# Patient Record
Sex: Male | Born: 1955 | Race: White | Hispanic: No | Marital: Single | Smoking: Never smoker
Health system: Southern US, Community
[De-identification: ages and names within clinical notes are randomized; demographics above are authoritative.]

## PROBLEM LIST (undated history)

## (undated) DIAGNOSIS — E119 Type 2 diabetes mellitus without complications: Secondary | ICD-10-CM

---

## 2015-05-25 ENCOUNTER — Ambulatory Visit (INDEPENDENT_AMBULATORY_CARE_PROVIDER_SITE_OTHER): Payer: Medicaid Other | Admitting: Sports Medicine

## 2015-05-25 ENCOUNTER — Ambulatory Visit (INDEPENDENT_AMBULATORY_CARE_PROVIDER_SITE_OTHER): Payer: Medicaid Other

## 2015-05-25 VITALS — BP 144/89 | HR 82 | Resp 18 | Ht 71.0 in | Wt 190.0 lb

## 2015-05-25 DIAGNOSIS — L84 Corns and callosities: Secondary | ICD-10-CM

## 2015-05-25 DIAGNOSIS — M79672 Pain in left foot: Secondary | ICD-10-CM

## 2015-05-25 DIAGNOSIS — M778 Other enthesopathies, not elsewhere classified: Secondary | ICD-10-CM

## 2015-05-25 DIAGNOSIS — M19072 Primary osteoarthritis, left ankle and foot: Secondary | ICD-10-CM | POA: Diagnosis not present

## 2015-05-25 DIAGNOSIS — G6289 Other specified polyneuropathies: Secondary | ICD-10-CM

## 2015-05-25 DIAGNOSIS — M7752 Other enthesopathy of left foot: Secondary | ICD-10-CM

## 2015-05-25 DIAGNOSIS — M2022 Hallux rigidus, left foot: Secondary | ICD-10-CM

## 2015-05-25 DIAGNOSIS — M779 Enthesopathy, unspecified: Secondary | ICD-10-CM

## 2015-05-25 NOTE — Progress Notes (Signed)
Patient ID: Jeremiah Burns, male   DOB: [birth year], 59 y.o.   MRN: [id number]  Subjective: [name] is a 59 y.o. male patient who presents to office for evaluation of Left foot pain. Patient complains of progressive pain especially over the last year few years in both feet which see neurologist for numbness, tingling, and burning. Patient states that left 2nd toe started bothering him >1 week ago noticed that the toe was red and was developing some skin that he pulled off on it that was very concerning.  Patient states that the left 2nd toe does not bother him as much as the neuropathy pain which is now interferring with daily activities; worse pain at night and legs feel restless. Patient has not tried any treatments for left 2nd toe. Patient denies related constitutional symptoms. Patient denies any other pedal complaints.   There are no active problems to display for this patient.  No current outpatient prescriptions on file prior to visit.   No current facility-administered medications on file prior to visit.   No Known Allergies   Objective:  General: Alert and oriented x3 in no acute distress  Dermatology: To the left 2nd toe medial aspect there is a small hyperkeratotic lesion with surrounding erythema and fullness, there is no warmth, no fluctuance or acute signs of infection , no webspace macerations, no ecchymosis bilateral, all nails x 10 are well manicured.  Vascular: Dorsalis Pedis and Posterior Tibial pedal pulses 2/4, Capillary Fill Time 3 seconds, + scant pedal hair growth bilateral, no edema bilateral lower extremities, Temperature gradient within normal limits.  Neurology: Michaell Cowing sensation intact via light touch bilateral, Protective sensation diminished with Semmes Weinstein Monofilament to all pedal sites, vibratory absent bilateral, Deep tendon reflexes within normal limits bilateral, No babinski sign present bilateral.   Musculoskeletal: Mild tenderness with  palpation at Left 2nd toe medial aspect with bony irregularity noted at the PIPJ suggestive of arthritic changes with limitation in motion, limitation of motion left 1st MTPJ-asymptomatic in nature, Muscular strength 5/5 in all groups tested. No pain with calf compression bilateral.   Left Foot 3 views    Impression: Significant spurring and arthritic changes at left 1st MTPJ, 2nd PIPJ, midfoot and ankle. No fracture/dislocation.   Assessment and Plan: Problem List Items Addressed This Visit    None    Visit Diagnoses    Left foot pain    -  Primary    Relevant Orders    DG Foot Complete Left    Capsulitis of foot, left        2nd pipj     Callus of foot        left 2nd toe medial aspect secondary to rubbing against big toe    Osteoarthritis of left foot, unspecified osteoarthritis type        left 2nd pipj    Hallux rigidus of left foot        Asymptomatic     Other polyneuropathy (HCC)        Relevant Medications    gabapentin (NEURONTIN) 300 MG capsule       -Complete examination performed -Xrays reviewed -Discussed treatement options, risks, alternatives, benefits -Answered all patients questions -Debrided callus using sterile chisel blade at medial aspect of left 2nd toe, injected inflamed capsule at this site with 0.25cc kenalog 10, 0.25cc dexamethasone phosphate mixed with 0.25cc lidocaine and marcaine plain; padded the area with silicone pad; instructed patient on how to use padding. -Recommend good supportive shoes -Recommend  patient follow up with neurologist for neuropathic pain; encouraged patient to consider neurosurgery as well  -Patient to return to office as needed or sooner if condition worsens.  [name], DPM

## 2016-10-03 DIAGNOSIS — Z789 Other specified health status: Secondary | ICD-10-CM

## 2016-10-03 DIAGNOSIS — E119 Type 2 diabetes mellitus without complications: Secondary | ICD-10-CM

## 2016-10-03 DIAGNOSIS — L0390 Cellulitis, unspecified: Secondary | ICD-10-CM

## 2016-10-03 DIAGNOSIS — E876 Hypokalemia: Secondary | ICD-10-CM

## 2016-10-03 DIAGNOSIS — K3184 Gastroparesis: Secondary | ICD-10-CM

## 2016-10-04 DIAGNOSIS — E876 Hypokalemia: Secondary | ICD-10-CM | POA: Diagnosis not present

## 2016-10-04 DIAGNOSIS — K3184 Gastroparesis: Secondary | ICD-10-CM | POA: Diagnosis not present

## 2016-10-04 DIAGNOSIS — E119 Type 2 diabetes mellitus without complications: Secondary | ICD-10-CM | POA: Diagnosis not present

## 2016-10-04 DIAGNOSIS — L0390 Cellulitis, unspecified: Secondary | ICD-10-CM | POA: Diagnosis not present

## 2016-10-05 DIAGNOSIS — E119 Type 2 diabetes mellitus without complications: Secondary | ICD-10-CM | POA: Diagnosis not present

## 2016-10-05 DIAGNOSIS — K3184 Gastroparesis: Secondary | ICD-10-CM | POA: Diagnosis not present

## 2016-10-05 DIAGNOSIS — L0390 Cellulitis, unspecified: Secondary | ICD-10-CM | POA: Diagnosis not present

## 2016-10-05 DIAGNOSIS — E876 Hypokalemia: Secondary | ICD-10-CM | POA: Diagnosis not present

## 2016-10-06 DIAGNOSIS — E119 Type 2 diabetes mellitus without complications: Secondary | ICD-10-CM | POA: Diagnosis not present

## 2016-10-06 DIAGNOSIS — E876 Hypokalemia: Secondary | ICD-10-CM | POA: Diagnosis not present

## 2016-10-06 DIAGNOSIS — L0390 Cellulitis, unspecified: Secondary | ICD-10-CM | POA: Diagnosis not present

## 2016-10-06 DIAGNOSIS — K3184 Gastroparesis: Secondary | ICD-10-CM | POA: Diagnosis not present

## 2017-08-11 ENCOUNTER — Other Ambulatory Visit: Payer: Self-pay | Admitting: Nurse Practitioner

## 2017-08-11 DIAGNOSIS — B182 Chronic viral hepatitis C: Secondary | ICD-10-CM

## 2017-08-27 ENCOUNTER — Ambulatory Visit
Admission: RE | Admit: 2017-08-27 | Discharge: 2017-08-27 | Disposition: A | Discharge status: Home / Self Care | Payer: Medicaid Other | Source: Ambulatory Visit | Attending: Nurse Practitioner | Admitting: Nurse Practitioner

## 2017-08-27 DIAGNOSIS — B182 Chronic viral hepatitis C: Secondary | ICD-10-CM

## 2019-02-17 IMAGING — US US ABDOMEN COMPLETE W/ ELASTOGRAPHY
1 series · 13 of 15 positions shown · non-contrast
Comparison: None.

CLINICAL DATA: Hepatitis-C



[Series 1: us abdomen complete w/ elastography · 0.14mm/px · 13 of 15 slices shown]
[im 1/15]
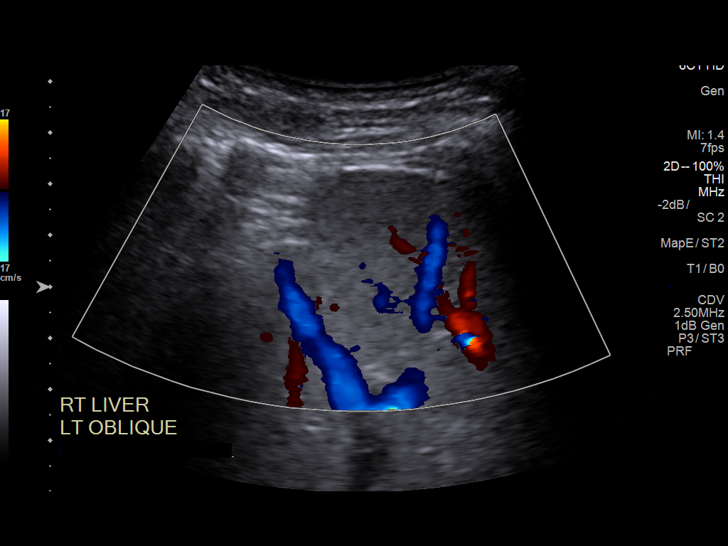
[im 2/15]
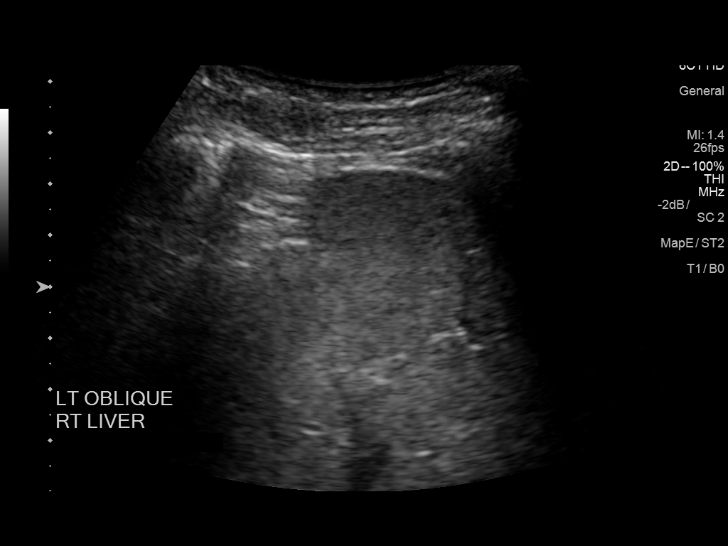
[im 3/15]
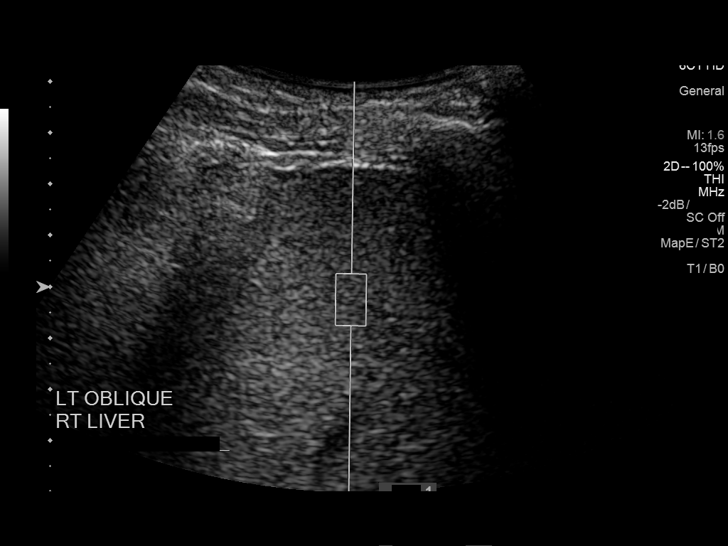
[im 5/15]
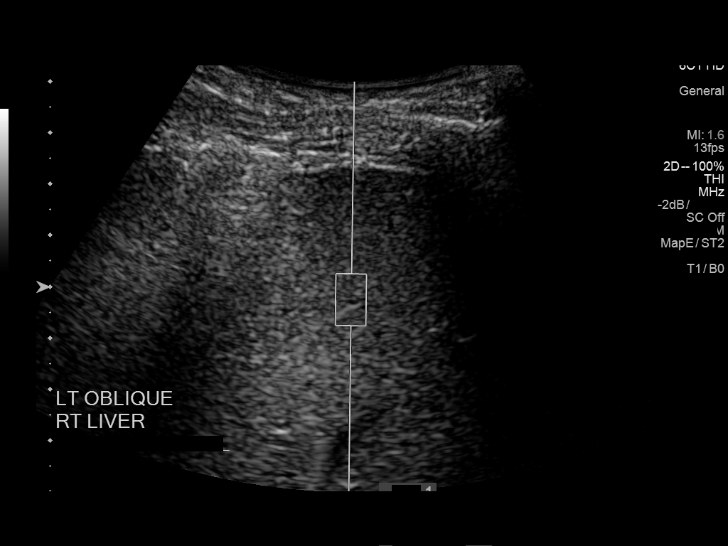
[im 6/15]
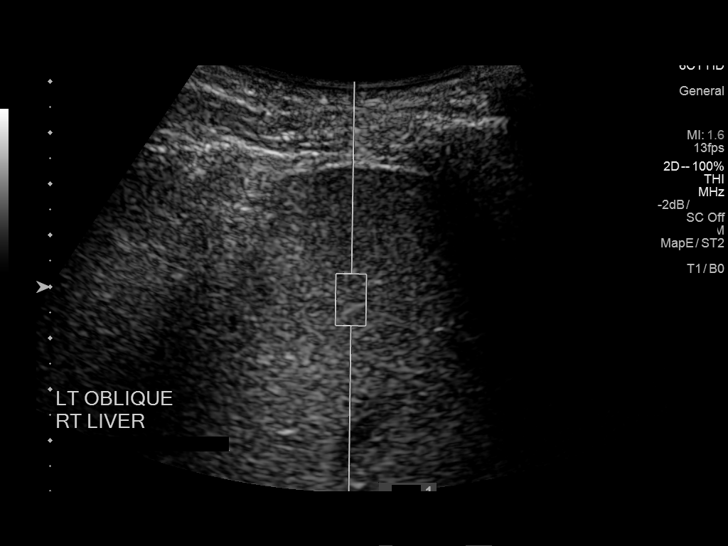
[im 7/15]
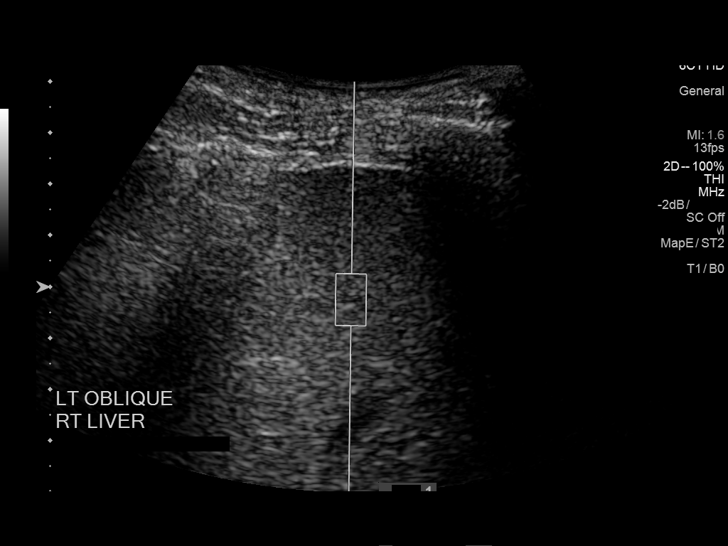
[im 8/15]
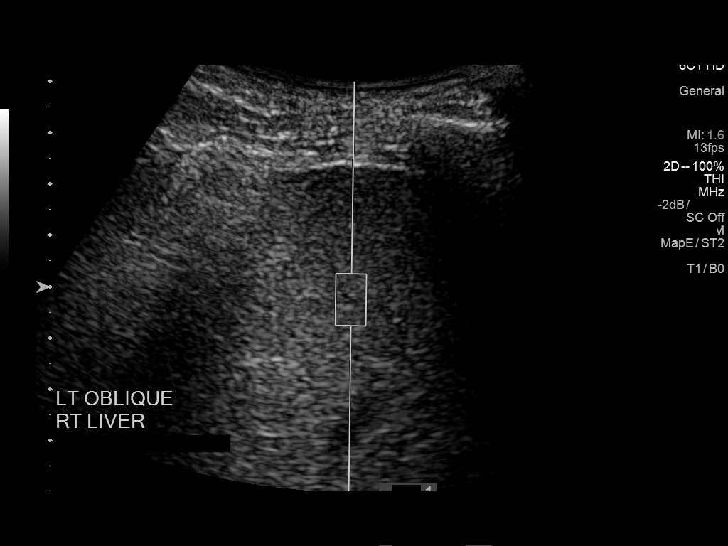
[im 9/15]
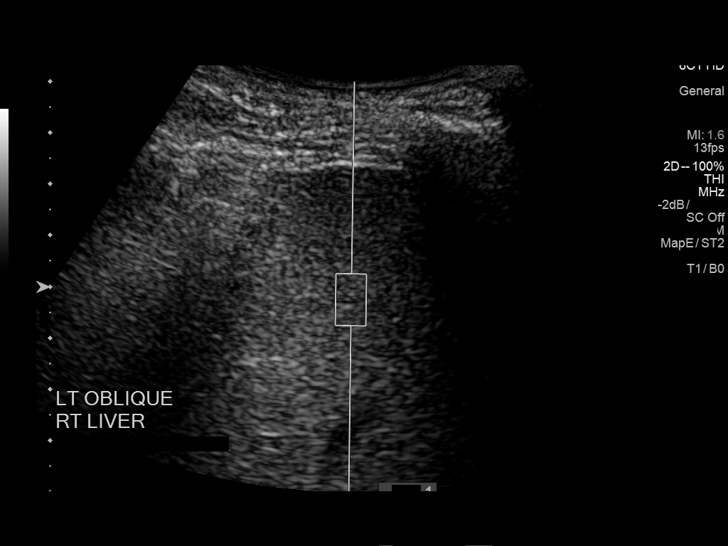
[im 10/15]
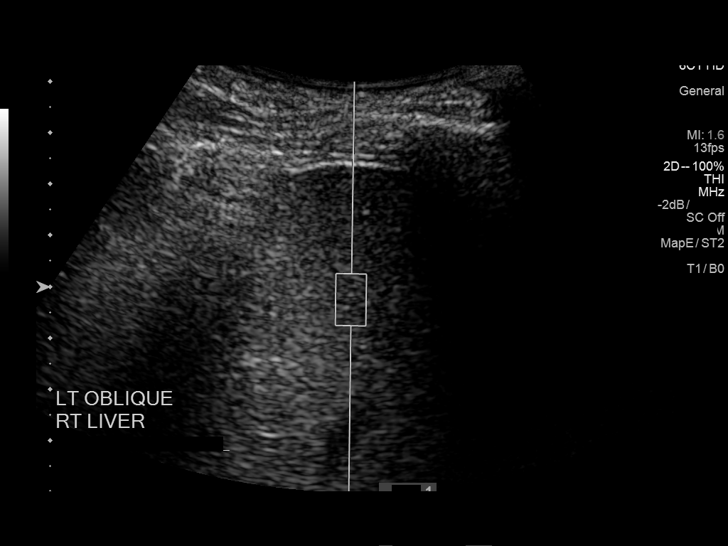
[im 11/15]
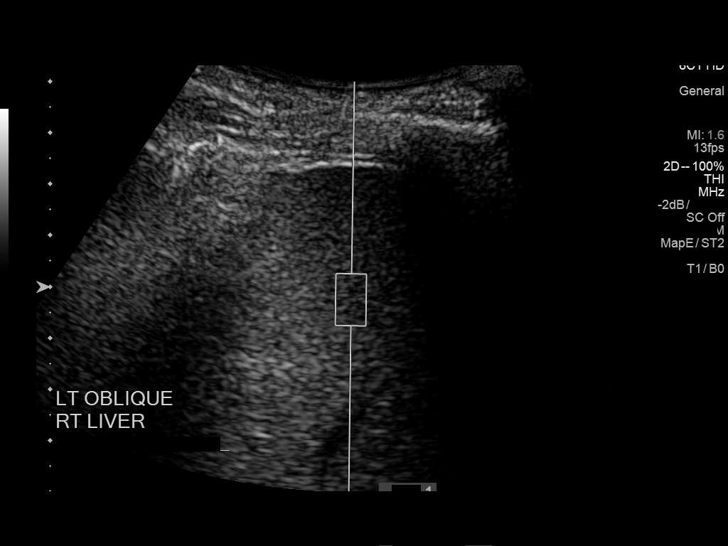
[im 13/15]
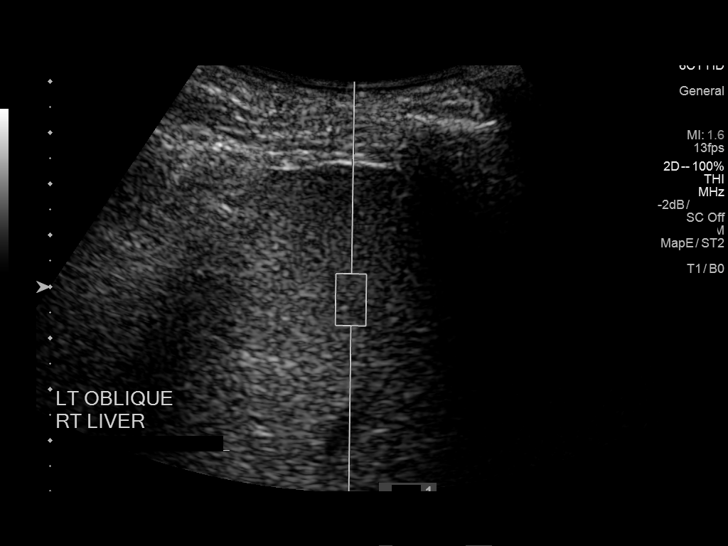
[im 14/15]
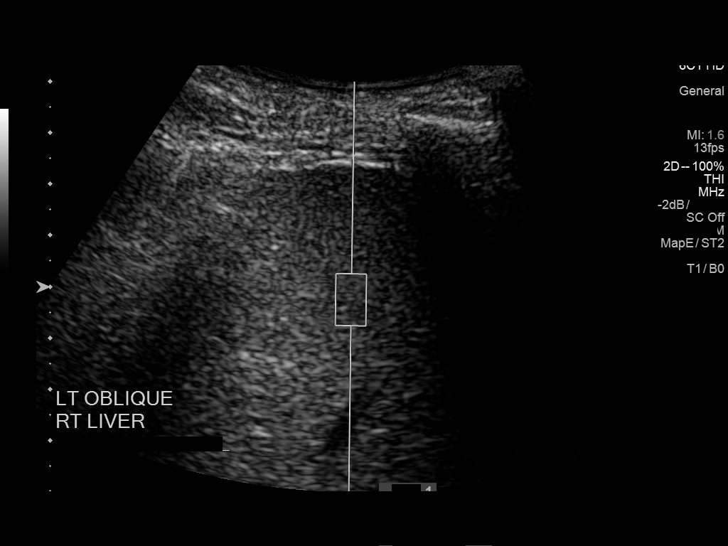
[im 15/15]
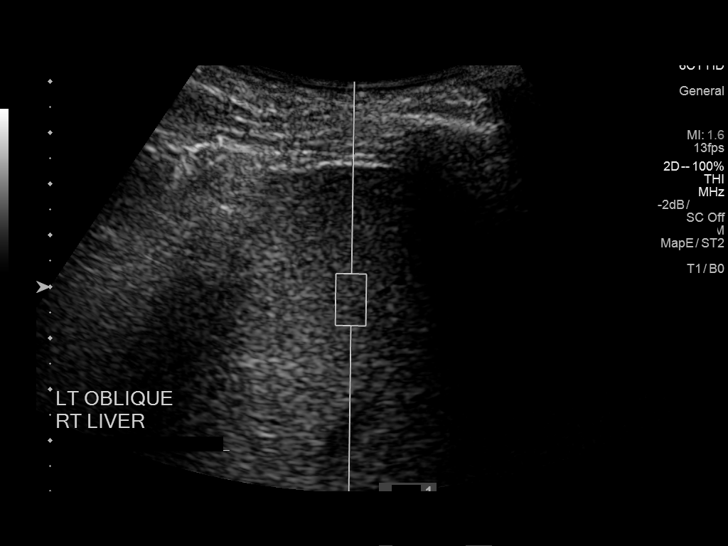

[13 of 15 positions shown; findings below may reference images not displayed]

FINDINGS: ULTRASOUND ABDOMEN

Gallbladder: Contracted with wall thickening measuring up to 4 mm.
No visible stones.

Common bile duct: Diameter: Normal caliber, 2 mm

Liver: No focal lesion identified. Within normal limits in
parenchymal echogenicity. Portal vein is patent on color Doppler
imaging with normal direction of blood flow towards the liver.

IVC: No abnormality visualized.

Pancreas: Visualized portion unremarkable.

Spleen: Size and appearance within normal limits.

Right Kidney: Length: 12.5 cm. Echogenicity within normal limits. No
mass or hydronephrosis visualized.

Left Kidney: Length: 13.1 cm. Echogenicity within normal limits. No
mass or hydronephrosis visualized.

Abdominal aorta: No aneurysm visualized.

Other findings: None.

ULTRASOUND HEPATIC ELASTOGRAPHY

Device: Siemens Helix VTQ

Patient position: Oblique

Transducer 6C1

Number of measurements: 10

Hepatic segment:  8

Median velocity:   1.84 m/sec

IQR:

IQR/Median velocity ratio:

Corresponding Metavir fibrosis score:  F2 + some F3

Risk of fibrosis: Moderate

Limitations of exam: None

Pertinent findings noted on other imaging exams:  None

Please note that abnormal shear wave velocities may also be
identified in clinical settings other than with hepatic fibrosis,
such as: acute hepatitis, elevated right heart and central venous
pressures including use of beta blockers, Jumper disease
(Jaylon), infiltrative processes such as
mastocytosis/amyloidosis/infiltrative tumor, extrahepatic
cholestasis, in the post-prandial state, and liver transplantation.
Correlation with patient history, laboratory data, and clinical
condition recommended.
IMPRESSION: ULTRASOUND ABDOMEN:
Contracted gallbladder with associated mild wall thickening. No
visible stones.

ULTRASOUND HEPATIC ELASTOGRAPHY:

Median hepatic shear wave velocity is calculated at 1.84 m/sec.

Corresponding Metavir fibrosis score is  F2 + some F3.

Risk of fibrosis is Moderate.

Follow-up: Additional testing appropriate

## 2019-02-17 IMAGING — US US ABDOMEN COMPLETE W/ ELASTOGRAPHY
1 series · 13 of 25 positions shown · non-contrast
Comparison: None.

CLINICAL DATA: Hepatitis-C



[Series 1: us abdomen complete w/ elastography · 0.14mm/px · 13 of 81 slices shown]
[im 1/81]
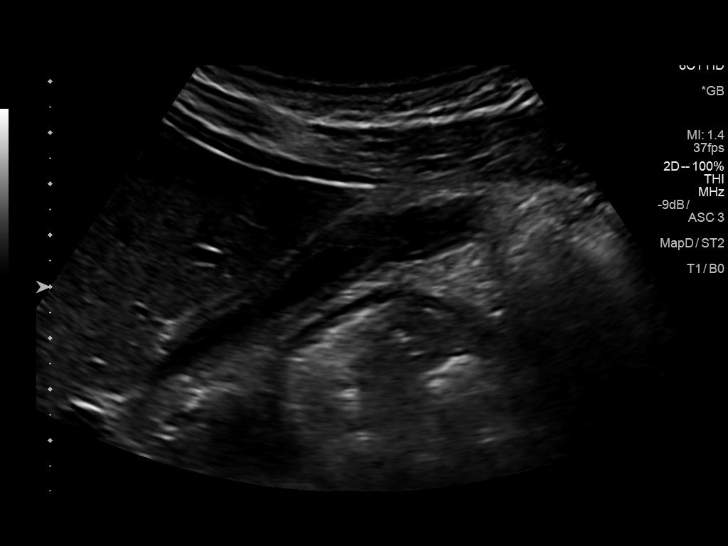
[im 7/81]
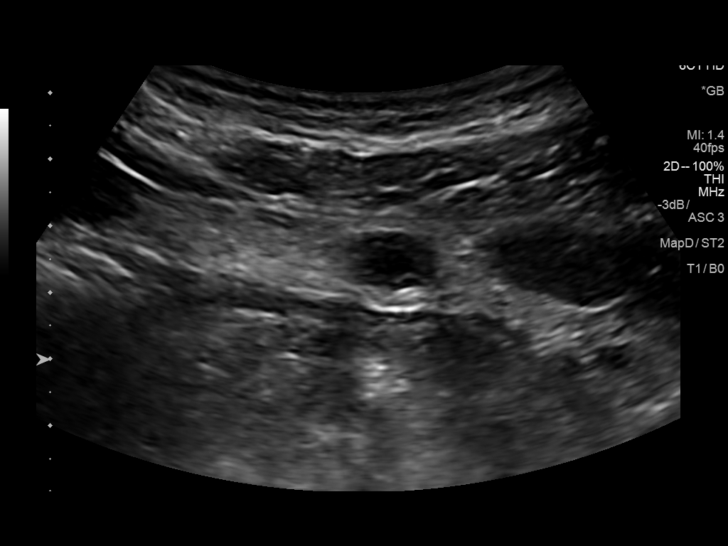
[im 14/81]
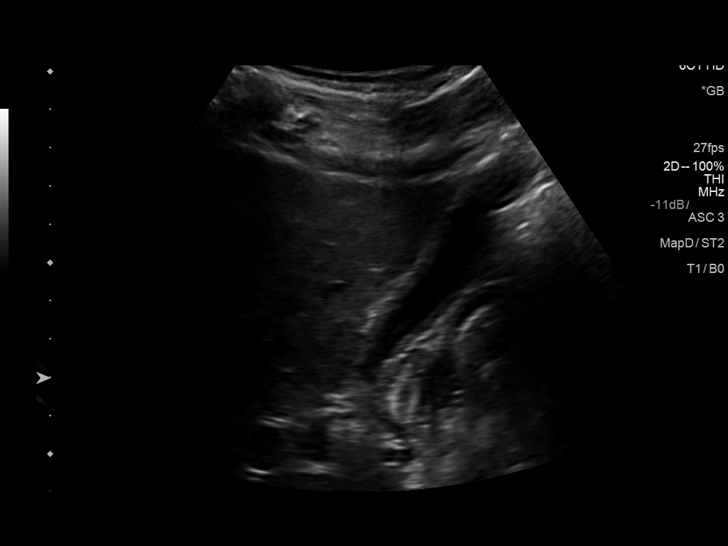
[im 21/81]
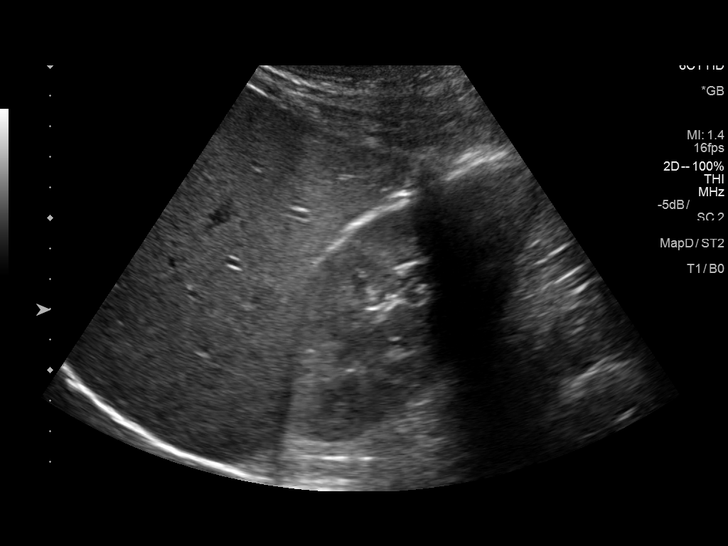
[im 27/81]
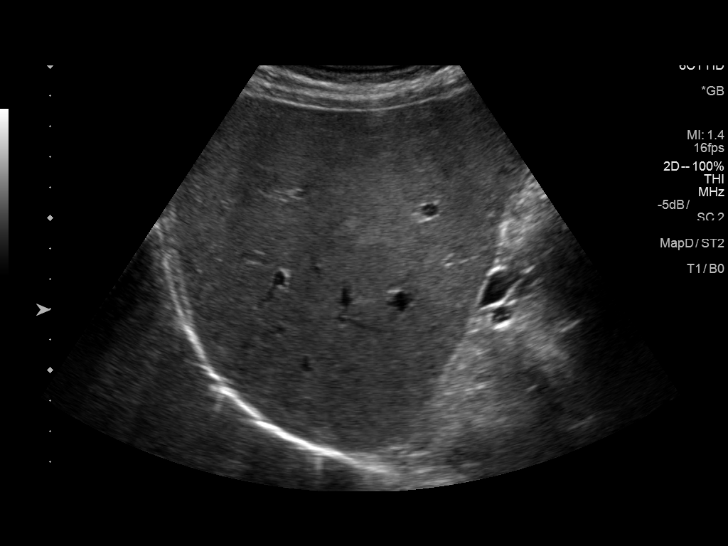
[im 34/81]
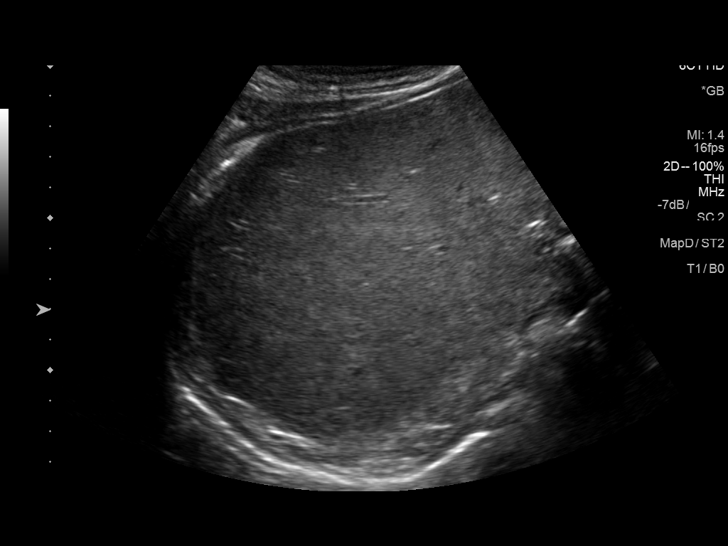
[im 41/81]
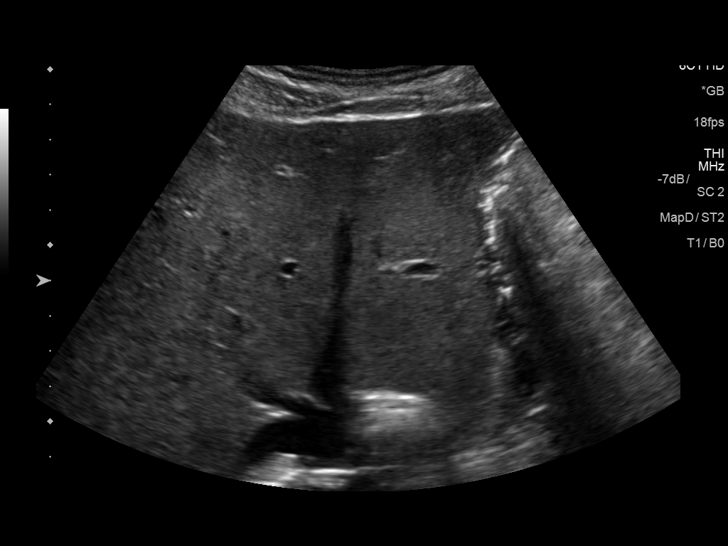
[im 47/81]
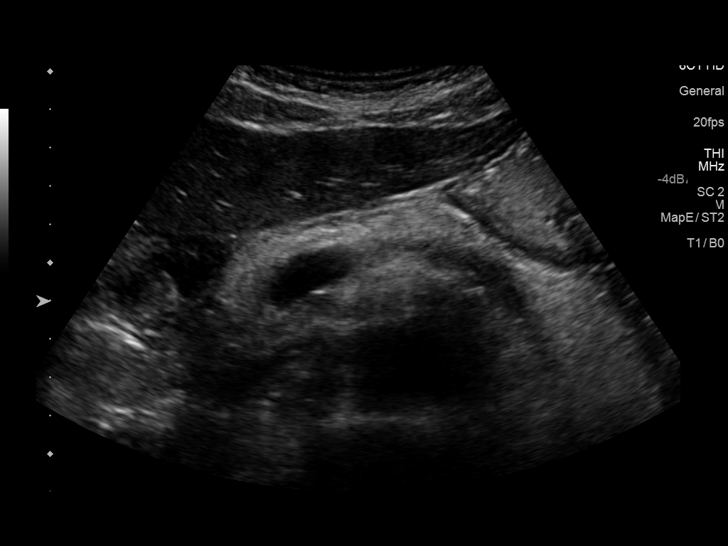
[im 54/81]
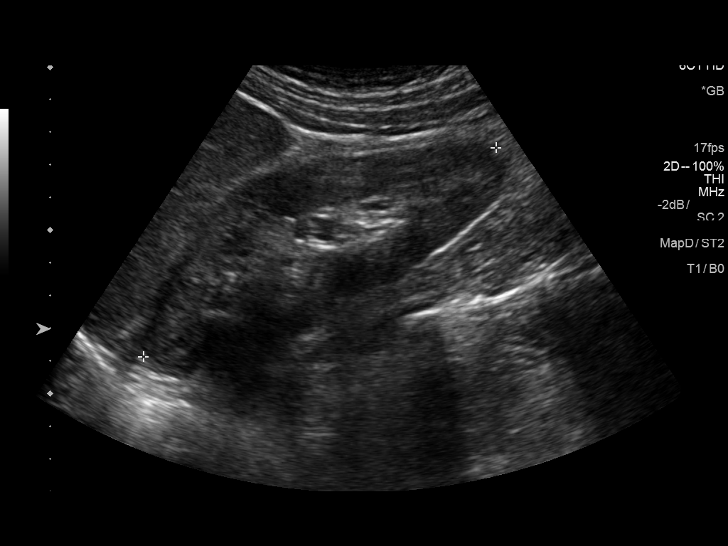
[im 61/81]
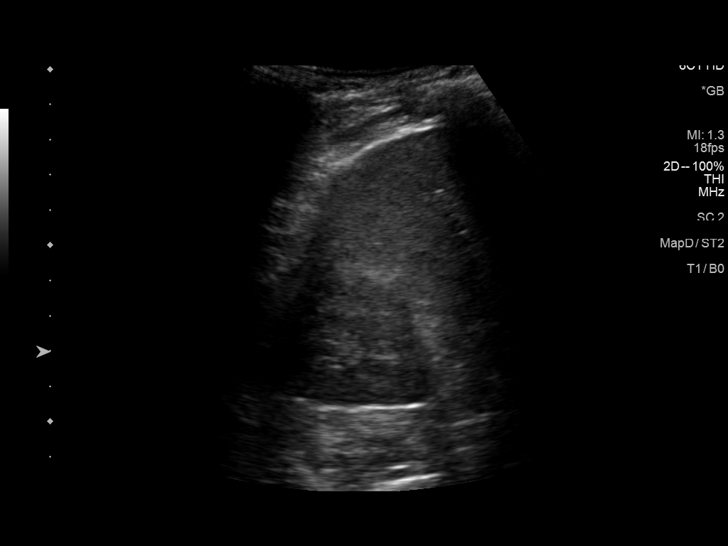
[im 67/81]
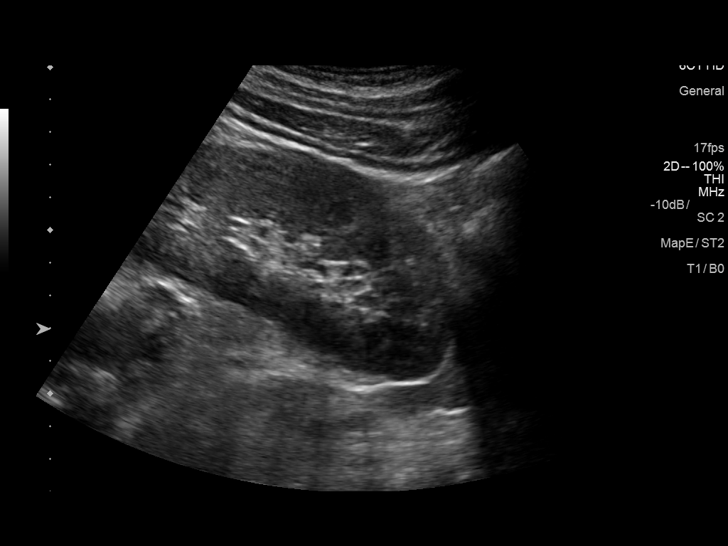
[im 74/81]
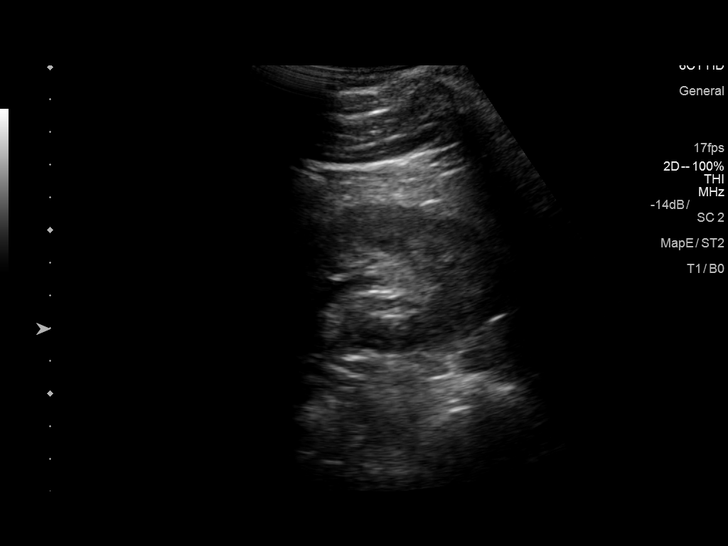
[im 81/81]
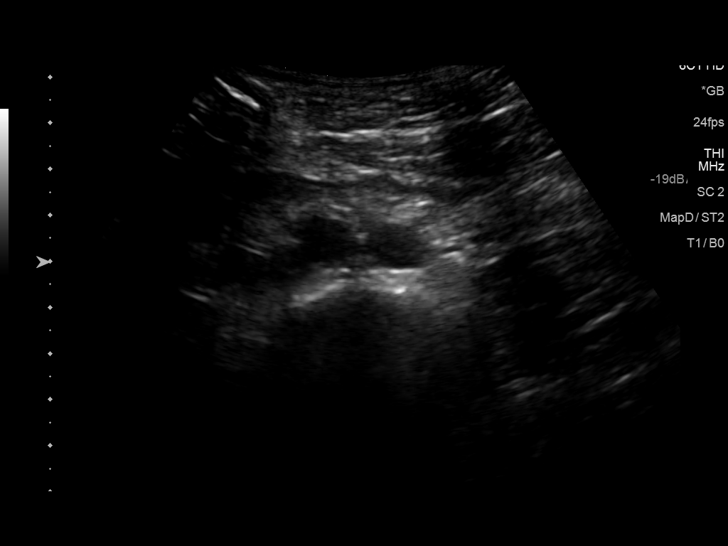

[13 of 25 positions shown; findings below may reference images not displayed]

FINDINGS: ULTRASOUND ABDOMEN

Gallbladder: Contracted with wall thickening measuring up to 4 mm.
No visible stones.

Common bile duct: Diameter: Normal caliber, 2 mm

Liver: No focal lesion identified. Within normal limits in
parenchymal echogenicity. Portal vein is patent on color Doppler
imaging with normal direction of blood flow towards the liver.

IVC: No abnormality visualized.

Pancreas: Visualized portion unremarkable.

Spleen: Size and appearance within normal limits.

Right Kidney: Length: 12.5 cm. Echogenicity within normal limits. No
mass or hydronephrosis visualized.

Left Kidney: Length: 13.1 cm. Echogenicity within normal limits. No
mass or hydronephrosis visualized.

Abdominal aorta: No aneurysm visualized.

Other findings: None.

ULTRASOUND HEPATIC ELASTOGRAPHY

Device: Siemens Helix VTQ

Patient position: Oblique

Transducer 6C1

Number of measurements: 10

Hepatic segment:  8

Median velocity:   1.84 m/sec

IQR:

IQR/Median velocity ratio:

Corresponding Metavir fibrosis score:  F2 + some F3

Risk of fibrosis: Moderate

Limitations of exam: None

Pertinent findings noted on other imaging exams:  None

Please note that abnormal shear wave velocities may also be
identified in clinical settings other than with hepatic fibrosis,
such as: acute hepatitis, elevated right heart and central venous
pressures including use of beta blockers, Jumper disease
(Jaylon), infiltrative processes such as
mastocytosis/amyloidosis/infiltrative tumor, extrahepatic
cholestasis, in the post-prandial state, and liver transplantation.
Correlation with patient history, laboratory data, and clinical
condition recommended.
IMPRESSION: ULTRASOUND ABDOMEN:
Contracted gallbladder with associated mild wall thickening. No
visible stones.

ULTRASOUND HEPATIC ELASTOGRAPHY:

Median hepatic shear wave velocity is calculated at 1.84 m/sec.

Corresponding Metavir fibrosis score is  F2 + some F3.

Risk of fibrosis is Moderate.

Follow-up: Additional testing appropriate

## 2023-11-18 ENCOUNTER — Ambulatory Visit (INDEPENDENT_AMBULATORY_CARE_PROVIDER_SITE_OTHER)

## 2023-11-18 ENCOUNTER — Ambulatory Visit (INDEPENDENT_AMBULATORY_CARE_PROVIDER_SITE_OTHER): Admitting: Podiatry

## 2023-11-18 ENCOUNTER — Encounter: Payer: Self-pay | Admitting: Podiatry

## 2023-11-18 ENCOUNTER — Inpatient Hospital Stay (HOSPITAL_COMMUNITY)
Admission: EM | Admit: 2023-11-18 | Discharge: 2023-11-28 | DRG: 629 | Disposition: A | Discharge status: Skilled Nursing Facility | Source: Ambulatory Visit | Attending: Internal Medicine | Admitting: Internal Medicine

## 2023-11-18 ENCOUNTER — Other Ambulatory Visit: Payer: Self-pay

## 2023-11-18 ENCOUNTER — Encounter (HOSPITAL_COMMUNITY): Payer: Self-pay

## 2023-11-18 DIAGNOSIS — E11621 Type 2 diabetes mellitus with foot ulcer: Secondary | ICD-10-CM | POA: Diagnosis not present

## 2023-11-18 DIAGNOSIS — M86172 Other acute osteomyelitis, left ankle and foot: Secondary | ICD-10-CM

## 2023-11-18 DIAGNOSIS — K5901 Slow transit constipation: Secondary | ICD-10-CM | POA: Diagnosis present

## 2023-11-18 DIAGNOSIS — Z7902 Long term (current) use of antithrombotics/antiplatelets: Secondary | ICD-10-CM

## 2023-11-18 DIAGNOSIS — E1140 Type 2 diabetes mellitus with diabetic neuropathy, unspecified: Secondary | ICD-10-CM

## 2023-11-18 DIAGNOSIS — E861 Hypovolemia: Secondary | ICD-10-CM | POA: Diagnosis not present

## 2023-11-18 DIAGNOSIS — E860 Dehydration: Secondary | ICD-10-CM | POA: Diagnosis present

## 2023-11-18 DIAGNOSIS — Z794 Long term (current) use of insulin: Secondary | ICD-10-CM

## 2023-11-18 DIAGNOSIS — E871 Hypo-osmolality and hyponatremia: Secondary | ICD-10-CM | POA: Diagnosis present

## 2023-11-18 DIAGNOSIS — M86672 Other chronic osteomyelitis, left ankle and foot: Secondary | ICD-10-CM | POA: Diagnosis present

## 2023-11-18 DIAGNOSIS — L97529 Non-pressure chronic ulcer of other part of left foot with unspecified severity: Secondary | ICD-10-CM | POA: Diagnosis present

## 2023-11-18 DIAGNOSIS — I70222 Atherosclerosis of native arteries of extremities with rest pain, left leg: Secondary | ICD-10-CM | POA: Diagnosis present

## 2023-11-18 DIAGNOSIS — F32A Depression, unspecified: Secondary | ICD-10-CM | POA: Diagnosis present

## 2023-11-18 DIAGNOSIS — E11628 Type 2 diabetes mellitus with other skin complications: Secondary | ICD-10-CM | POA: Diagnosis not present

## 2023-11-18 DIAGNOSIS — E119 Type 2 diabetes mellitus without complications: Secondary | ICD-10-CM

## 2023-11-18 DIAGNOSIS — L089 Local infection of the skin and subcutaneous tissue, unspecified: Secondary | ICD-10-CM | POA: Diagnosis present

## 2023-11-18 DIAGNOSIS — M869 Osteomyelitis, unspecified: Secondary | ICD-10-CM

## 2023-11-18 DIAGNOSIS — Z7984 Long term (current) use of oral hypoglycemic drugs: Secondary | ICD-10-CM

## 2023-11-18 DIAGNOSIS — Z7982 Long term (current) use of aspirin: Secondary | ICD-10-CM

## 2023-11-18 DIAGNOSIS — F419 Anxiety disorder, unspecified: Secondary | ICD-10-CM | POA: Diagnosis present

## 2023-11-18 DIAGNOSIS — E1151 Type 2 diabetes mellitus with diabetic peripheral angiopathy without gangrene: Secondary | ICD-10-CM | POA: Diagnosis present

## 2023-11-18 DIAGNOSIS — E1122 Type 2 diabetes mellitus with diabetic chronic kidney disease: Secondary | ICD-10-CM | POA: Diagnosis present

## 2023-11-18 DIAGNOSIS — E785 Hyperlipidemia, unspecified: Secondary | ICD-10-CM | POA: Diagnosis present

## 2023-11-18 DIAGNOSIS — E1169 Type 2 diabetes mellitus with other specified complication: Secondary | ICD-10-CM | POA: Diagnosis not present

## 2023-11-18 DIAGNOSIS — M7752 Other enthesopathy of left foot: Secondary | ICD-10-CM | POA: Diagnosis present

## 2023-11-18 DIAGNOSIS — Z79899 Other long term (current) drug therapy: Secondary | ICD-10-CM

## 2023-11-18 DIAGNOSIS — N179 Acute kidney failure, unspecified: Secondary | ICD-10-CM | POA: Diagnosis present

## 2023-11-18 DIAGNOSIS — E1142 Type 2 diabetes mellitus with diabetic polyneuropathy: Secondary | ICD-10-CM | POA: Diagnosis present

## 2023-11-18 DIAGNOSIS — L97429 Non-pressure chronic ulcer of left heel and midfoot with unspecified severity: Secondary | ICD-10-CM | POA: Diagnosis not present

## 2023-11-18 DIAGNOSIS — L97424 Non-pressure chronic ulcer of left heel and midfoot with necrosis of bone: Secondary | ICD-10-CM

## 2023-11-18 DIAGNOSIS — Z751 Person awaiting admission to adequate facility elsewhere: Secondary | ICD-10-CM

## 2023-11-18 DIAGNOSIS — E1165 Type 2 diabetes mellitus with hyperglycemia: Secondary | ICD-10-CM | POA: Diagnosis present

## 2023-11-18 DIAGNOSIS — N182 Chronic kidney disease, stage 2 (mild): Secondary | ICD-10-CM | POA: Diagnosis present

## 2023-11-18 DIAGNOSIS — M868X7 Other osteomyelitis, ankle and foot: Principal | ICD-10-CM

## 2023-11-18 DIAGNOSIS — L03116 Cellulitis of left lower limb: Secondary | ICD-10-CM | POA: Diagnosis present

## 2023-11-18 DIAGNOSIS — N1831 Chronic kidney disease, stage 3a: Secondary | ICD-10-CM

## 2023-11-18 DIAGNOSIS — E11649 Type 2 diabetes mellitus with hypoglycemia without coma: Secondary | ICD-10-CM | POA: Diagnosis not present

## 2023-11-18 HISTORY — DX: Type 2 diabetes mellitus without complications: E11.9

## 2023-11-18 LAB — COMPREHENSIVE METABOLIC PANEL WITH GFR
ALT: 17 U/L (ref 0–44)
AST: 18 U/L (ref 15–41)
Albumin: 3.6 g/dL (ref 3.5–5.0)
Alkaline Phosphatase: 39 U/L (ref 38–126)
Anion gap: 10 (ref 5–15)
BUN: 47 mg/dL — ABNORMAL HIGH (ref 8–23)
CO2: 22 mmol/L (ref 22–32)
Calcium: 9.4 mg/dL (ref 8.9–10.3)
Chloride: 98 mmol/L (ref 98–111)
Creatinine, Ser: 1.45 mg/dL — ABNORMAL HIGH (ref 0.61–1.24)
GFR, Estimated: 53 mL/min — ABNORMAL LOW (ref >60)
Glucose, Bld: 217 mg/dL — ABNORMAL HIGH (ref 70–99)
Potassium: 5.1 mmol/L (ref 3.5–5.1)
Sodium: 130 mmol/L — ABNORMAL LOW (ref 135–145)
Total Bilirubin: 0.3 mg/dL (ref 0.0–1.2)
Total Protein: 7 g/dL (ref 6.5–8.1)

## 2023-11-18 LAB — CBC WITH DIFFERENTIAL/PLATELET
Abs Immature Granulocytes: 0.03 10*3/uL (ref 0.00–0.07)
Basophils Absolute: 0.1 10*3/uL (ref 0.0–0.1)
Basophils Relative: 1 %
Eosinophils Absolute: 0.2 10*3/uL (ref 0.0–0.5)
Eosinophils Relative: 2 %
HCT: 43.6 % (ref 39.0–52.0)
Hemoglobin: 14.6 g/dL (ref 13.0–17.0)
Immature Granulocytes: 0 %
Lymphocytes Relative: 18 %
Lymphs Abs: 1.5 10*3/uL (ref 0.7–4.0)
MCH: 28.5 pg (ref 26.0–34.0)
MCHC: 33.5 g/dL (ref 30.0–36.0)
MCV: 85.2 fL (ref 80.0–100.0)
Monocytes Absolute: 1 10*3/uL (ref 0.1–1.0)
Monocytes Relative: 12 %
Neutro Abs: 5.9 10*3/uL (ref 1.7–7.7)
Neutrophils Relative %: 67 %
Platelets: 270 10*3/uL (ref 150–400)
RBC: 5.12 MIL/uL (ref 4.22–5.81)
RDW: 12.8 % (ref 11.5–15.5)
WBC: 8.7 10*3/uL (ref 4.0–10.5)
nRBC: 0 % (ref 0.0–0.2)

## 2023-11-18 LAB — I-STAT CG4 LACTIC ACID, ED: Lactic Acid, Venous: 0.9 mmol/L (ref 0.5–1.9)

## 2023-11-18 NOTE — Progress Notes (Signed)
       Chief Complaint  Patient presents with   Diabetic Ulcer    Diabetic ulcer of left forefoot. It is throbbing, and keeping him up at night, it is open with a smell. His last A1c was a 10, two weeks ago. No anti coag.     HPI: 68 y.o. male presents today with left forefoot ulceration underneath the first metatarsal head extending to the interspace.  He states that he has had this for over a year at this point.  He reports that he has not had it treated.  He states has become painful and throbs.  He does have some redness of the foot today.  Patient is diabetic.  Does endorse numbness in his feet.    No Known Allergies  ROS denies any nausea, vomiting, fever, chills, chest pain, shortness of breath   Physical Exam: There were no vitals filed for this visit.  General: The patient is alert and oriented x3 in no acute distress.  Dermatology: Ulceration present plantar aspect left first metatarsal head extending to the interspace approximately 4 x 1 x 0.6 cm positive probe to capsule.  Hyperkeratotic borders with undermining present, some debris and necrotic tissue to the wound bed with underlying granulation tissue.  Some mild malodor present.  Postdebridement measurements 4 x 1.7 x 0.7 cm with bleeding wound base and wound edges though the wound does probe to underlying bone    Vascular: DP pulses palpable bilaterally, PT pulses faintly palpable.  Capillary refill within normal limits.  Mild edema left forefoot.  Mild erythema left forefoot  Neurological: Protective sensation absent  Musculoskeletal Exam: No gross pedal deformities noted  Radiographic Exam: Left foot 3 views weightbearing 11/18/2023 No soft tissue emphysema noted.  There is osteolysis and cortical disruption of the first MPJ suggestive of osteomyelitis versus septic arthritis correlating with location of the ulceration.  Vascular calcifications noted  Assessment/Plan of Care: 1. Diabetic ulcer of left midfoot  associated with type 2 diabetes mellitus, with necrosis of bone (HCC)   2. Acute osteomyelitis of left ankle or foot (HCC)   3. Poorly controlled type 2 diabetes mellitus with neuropathy (HCC)      No orders of the defined types were placed in this encounter.  None  Discussed clinical findings with patient today.  Radiographs discussed with patient today.  Wound was excisionally debrided using #15 blade and tissue nipper including skin, subcutaneous tissue, muscle fascia.  Found to involve bone.  Betadine wet-to-dry dressing applied.  Clinical findings highly suggestive of osteomyelitis.  Instructed the patient to go to the hospital for admission for infectious workup, IV antibiotics, MRI, and workup for surgical intervention.  Does have palpable pulses though recommended what vascular workup.  I advised the patient to go to [facility] though the patient states that he may need to go to [facility] due to proximity to home for him.   Rayette Mogg L. Marchia Bond, AACFAS [facility]     [address] [place], [place] [address]                Office [phone]  Fax [phone]

## 2023-11-18 NOTE — ED Triage Notes (Signed)
 Pt was at podiatrist and podiatrist stated he needed to go to hospital. Denies fevers/CP/SHOB.

## 2023-11-19 ENCOUNTER — Inpatient Hospital Stay (HOSPITAL_COMMUNITY)

## 2023-11-19 ENCOUNTER — Encounter (HOSPITAL_COMMUNITY): Payer: Self-pay | Admitting: Internal Medicine

## 2023-11-19 DIAGNOSIS — E871 Hypo-osmolality and hyponatremia: Secondary | ICD-10-CM | POA: Diagnosis present

## 2023-11-19 DIAGNOSIS — N182 Chronic kidney disease, stage 2 (mild): Secondary | ICD-10-CM | POA: Diagnosis present

## 2023-11-19 DIAGNOSIS — M71172 Other infective bursitis, left ankle and foot: Secondary | ICD-10-CM | POA: Diagnosis not present

## 2023-11-19 DIAGNOSIS — M86172 Other acute osteomyelitis, left ankle and foot: Secondary | ICD-10-CM | POA: Diagnosis not present

## 2023-11-19 DIAGNOSIS — B964 Proteus (mirabilis) (morganii) as the cause of diseases classified elsewhere: Secondary | ICD-10-CM | POA: Diagnosis not present

## 2023-11-19 DIAGNOSIS — E1165 Type 2 diabetes mellitus with hyperglycemia: Secondary | ICD-10-CM | POA: Diagnosis present

## 2023-11-19 DIAGNOSIS — K5901 Slow transit constipation: Secondary | ICD-10-CM | POA: Diagnosis present

## 2023-11-19 DIAGNOSIS — L03116 Cellulitis of left lower limb: Secondary | ICD-10-CM | POA: Diagnosis present

## 2023-11-19 DIAGNOSIS — M869 Osteomyelitis, unspecified: Secondary | ICD-10-CM | POA: Diagnosis not present

## 2023-11-19 DIAGNOSIS — M868X7 Other osteomyelitis, ankle and foot: Secondary | ICD-10-CM | POA: Diagnosis not present

## 2023-11-19 DIAGNOSIS — E1151 Type 2 diabetes mellitus with diabetic peripheral angiopathy without gangrene: Secondary | ICD-10-CM | POA: Diagnosis present

## 2023-11-19 DIAGNOSIS — E1142 Type 2 diabetes mellitus with diabetic polyneuropathy: Secondary | ICD-10-CM | POA: Diagnosis present

## 2023-11-19 DIAGNOSIS — L97529 Non-pressure chronic ulcer of other part of left foot with unspecified severity: Secondary | ICD-10-CM | POA: Diagnosis present

## 2023-11-19 DIAGNOSIS — N179 Acute kidney failure, unspecified: Secondary | ICD-10-CM

## 2023-11-19 DIAGNOSIS — M86672 Other chronic osteomyelitis, left ankle and foot: Secondary | ICD-10-CM | POA: Diagnosis present

## 2023-11-19 DIAGNOSIS — E11628 Type 2 diabetes mellitus with other skin complications: Secondary | ICD-10-CM | POA: Diagnosis present

## 2023-11-19 DIAGNOSIS — R7881 Bacteremia: Secondary | ICD-10-CM | POA: Diagnosis not present

## 2023-11-19 DIAGNOSIS — I70202 Unspecified atherosclerosis of native arteries of extremities, left leg: Secondary | ICD-10-CM | POA: Diagnosis not present

## 2023-11-19 DIAGNOSIS — E1169 Type 2 diabetes mellitus with other specified complication: Secondary | ICD-10-CM | POA: Diagnosis present

## 2023-11-19 DIAGNOSIS — Z794 Long term (current) use of insulin: Secondary | ICD-10-CM | POA: Diagnosis not present

## 2023-11-19 DIAGNOSIS — N1831 Chronic kidney disease, stage 3a: Secondary | ICD-10-CM

## 2023-11-19 DIAGNOSIS — I70222 Atherosclerosis of native arteries of extremities with rest pain, left leg: Secondary | ICD-10-CM | POA: Diagnosis present

## 2023-11-19 DIAGNOSIS — L089 Local infection of the skin and subcutaneous tissue, unspecified: Secondary | ICD-10-CM | POA: Diagnosis present

## 2023-11-19 DIAGNOSIS — I7090 Unspecified atherosclerosis: Secondary | ICD-10-CM | POA: Diagnosis not present

## 2023-11-19 DIAGNOSIS — B958 Unspecified staphylococcus as the cause of diseases classified elsewhere: Secondary | ICD-10-CM | POA: Diagnosis not present

## 2023-11-19 DIAGNOSIS — Z79899 Other long term (current) drug therapy: Secondary | ICD-10-CM | POA: Diagnosis not present

## 2023-11-19 DIAGNOSIS — F32A Depression, unspecified: Secondary | ICD-10-CM | POA: Diagnosis present

## 2023-11-19 DIAGNOSIS — Z7984 Long term (current) use of oral hypoglycemic drugs: Secondary | ICD-10-CM | POA: Diagnosis not present

## 2023-11-19 DIAGNOSIS — E785 Hyperlipidemia, unspecified: Secondary | ICD-10-CM | POA: Diagnosis present

## 2023-11-19 DIAGNOSIS — I70245 Atherosclerosis of native arteries of left leg with ulceration of other part of foot: Secondary | ICD-10-CM | POA: Diagnosis not present

## 2023-11-19 DIAGNOSIS — E119 Type 2 diabetes mellitus without complications: Secondary | ICD-10-CM

## 2023-11-19 DIAGNOSIS — E1122 Type 2 diabetes mellitus with diabetic chronic kidney disease: Secondary | ICD-10-CM | POA: Diagnosis present

## 2023-11-19 DIAGNOSIS — M7752 Other enthesopathy of left foot: Secondary | ICD-10-CM | POA: Diagnosis present

## 2023-11-19 DIAGNOSIS — E860 Dehydration: Secondary | ICD-10-CM | POA: Diagnosis present

## 2023-11-19 DIAGNOSIS — E11649 Type 2 diabetes mellitus with hypoglycemia without coma: Secondary | ICD-10-CM | POA: Diagnosis not present

## 2023-11-19 DIAGNOSIS — E861 Hypovolemia: Secondary | ICD-10-CM | POA: Diagnosis not present

## 2023-11-19 DIAGNOSIS — E11621 Type 2 diabetes mellitus with foot ulcer: Secondary | ICD-10-CM | POA: Diagnosis present

## 2023-11-19 LAB — CBC
HCT: 42.5 % (ref 39.0–52.0)
Hemoglobin: 13.9 g/dL (ref 13.0–17.0)
MCH: 28.7 pg (ref 26.0–34.0)
MCHC: 32.7 g/dL (ref 30.0–36.0)
MCV: 87.8 fL (ref 80.0–100.0)
Platelets: 247 10*3/uL (ref 150–400)
RBC: 4.84 MIL/uL (ref 4.22–5.81)
RDW: 13.2 % (ref 11.5–15.5)
WBC: 8.9 10*3/uL (ref 4.0–10.5)
nRBC: 0 % (ref 0.0–0.2)

## 2023-11-19 LAB — BASIC METABOLIC PANEL WITH GFR
Anion gap: 9 (ref 5–15)
BUN: 50 mg/dL — ABNORMAL HIGH (ref 8–23)
CO2: 19 mmol/L — ABNORMAL LOW (ref 22–32)
Calcium: 9 mg/dL (ref 8.9–10.3)
Chloride: 104 mmol/L (ref 98–111)
Creatinine, Ser: 1.6 mg/dL — ABNORMAL HIGH (ref 0.61–1.24)
GFR, Estimated: 47 mL/min — ABNORMAL LOW (ref >60)
Glucose, Bld: 131 mg/dL — ABNORMAL HIGH (ref 70–99)
Potassium: 4.7 mmol/L (ref 3.5–5.1)
Sodium: 132 mmol/L — ABNORMAL LOW (ref 135–145)

## 2023-11-19 LAB — VAS US ABI WITH/WO TBI
Left ABI: 1.18
Right ABI: 0.86

## 2023-11-19 LAB — I-STAT CG4 LACTIC ACID, ED: Lactic Acid, Venous: 0.7 mmol/L (ref 0.5–1.9)

## 2023-11-19 LAB — HEMOGLOBIN A1C
Hgb A1c MFr Bld: 8.9 % — ABNORMAL HIGH (ref 4.8–5.6)
Mean Plasma Glucose: 208.73 mg/dL

## 2023-11-19 LAB — GLUCOSE, CAPILLARY
Glucose-Capillary: 112 mg/dL — ABNORMAL HIGH (ref 70–99)
Glucose-Capillary: 84 mg/dL (ref 70–99)
Glucose-Capillary: 116 mg/dL — ABNORMAL HIGH (ref 70–99)

## 2023-11-19 LAB — CBG MONITORING, ED
Glucose-Capillary: 133 mg/dL — ABNORMAL HIGH (ref 70–99)
Glucose-Capillary: 146 mg/dL — ABNORMAL HIGH (ref 70–99)
Glucose-Capillary: 67 mg/dL — ABNORMAL LOW (ref 70–99)
Glucose-Capillary: 142 mg/dL — ABNORMAL HIGH (ref 70–99)

## 2023-11-19 LAB — C-REACTIVE PROTEIN: CRP: <0.5 mg/dL (ref <1.0)

## 2023-11-19 LAB — SEDIMENTATION RATE: Sed Rate: 25 mm/h — ABNORMAL HIGH (ref 0–16)

## 2023-11-19 LAB — HIV ANTIBODY (ROUTINE TESTING W REFLEX): HIV Screen 4th Generation wRfx: NONREACTIVE

## 2023-11-19 LAB — OSMOLALITY: Osmolality: 306 mosm/kg — ABNORMAL HIGH (ref 275–295)

## 2023-11-19 MED ORDER — ACETAMINOPHEN 325 MG PO TABS
650.0000 mg | ORAL_TABLET | Freq: Four times a day (QID) | ORAL | Status: DC | PRN
  Administered 2023-11-19: 650 mg via ORAL
  Filled 2023-11-19: qty 2

## 2023-11-19 MED ORDER — OXYCODONE HCL 5 MG PO TABS
5.0000 mg | ORAL_TABLET | Freq: Four times a day (QID) | ORAL | Status: AC | PRN
  Administered 2023-11-19: 5 mg via ORAL
  Filled 2023-11-19: qty 1

## 2023-11-19 MED ORDER — DEXTROSE 50 % IV SOLN
INTRAVENOUS | Status: AC
  Administered 2023-11-19: 25 mL
  Filled 2023-11-19: qty 50

## 2023-11-19 MED ORDER — INSULIN ASPART 100 UNIT/ML IJ SOLN
0.0000 [IU] | Freq: Three times a day (TID) | INTRAMUSCULAR | Status: DC
  Administered 2023-11-20: 1 [IU] via SUBCUTANEOUS
  Administered 2023-11-21 (×2): 2 [IU] via SUBCUTANEOUS
  Administered 2023-11-22: 3 [IU] via SUBCUTANEOUS
  Administered 2023-11-22: 2 [IU] via SUBCUTANEOUS

## 2023-11-19 MED ORDER — ENOXAPARIN SODIUM 40 MG/0.4ML IJ SOSY
40.0000 mg | PREFILLED_SYRINGE | Freq: Every day | INTRAMUSCULAR | Status: AC
  Administered 2023-11-19: 40 mg via SUBCUTANEOUS
  Filled 2023-11-19: qty 0.4

## 2023-11-19 MED ORDER — ZINC SULFATE 220 (50 ZN) MG PO CAPS
220.0000 mg | ORAL_CAPSULE | Freq: Every day | ORAL | Status: DC
  Administered 2023-11-19 – 2023-11-28 (×10): 220 mg via ORAL
  Filled 2023-11-19 (×10): qty 1

## 2023-11-19 MED ORDER — ENOXAPARIN SODIUM 40 MG/0.4ML IJ SOSY
40.0000 mg | PREFILLED_SYRINGE | Freq: Every day | INTRAMUSCULAR | Status: DC
  Administered 2023-11-22 – 2023-11-28 (×7): 40 mg via SUBCUTANEOUS
  Filled 2023-11-19 (×8): qty 0.4

## 2023-11-19 MED ORDER — ACETAMINOPHEN 325 MG PO TABS
650.0000 mg | ORAL_TABLET | Freq: Four times a day (QID) | ORAL | Status: DC | PRN
Start: 2023-11-19 — End: 2023-11-19

## 2023-11-19 MED ORDER — ASPIRIN 81 MG PO TBEC
81.0000 mg | DELAYED_RELEASE_TABLET | Freq: Every day | ORAL | Status: DC
  Administered 2023-11-19 – 2023-11-28 (×10): 81 mg via ORAL
  Filled 2023-11-19 (×10): qty 1

## 2023-11-19 MED ORDER — MORPHINE SULFATE (PF) 2 MG/ML IV SOLN
1.0000 mg | INTRAVENOUS | Status: DC | PRN
  Administered 2023-11-19 – 2023-11-27 (×32): 1 mg via INTRAVENOUS
  Filled 2023-11-19 (×32): qty 1

## 2023-11-19 MED ORDER — INSULIN ASPART 100 UNIT/ML IJ SOLN
0.0000 [IU] | INTRAMUSCULAR | Status: DC
  Administered 2023-11-19: 1 [IU] via SUBCUTANEOUS

## 2023-11-19 MED ORDER — VANCOMYCIN HCL 1500 MG/300ML IV SOLN
1500.0000 mg | INTRAVENOUS | Status: DC
  Administered 2023-11-20 – 2023-11-26 (×7): 1500 mg via INTRAVENOUS
  Filled 2023-11-19 (×8): qty 300

## 2023-11-19 MED ORDER — SODIUM CHLORIDE 0.9 % IV SOLN: INTRAVENOUS | Status: AC

## 2023-11-19 MED ORDER — PIPERACILLIN-TAZOBACTAM 3.375 G IVPB
3.3750 g | Freq: Three times a day (TID) | INTRAVENOUS | Status: DC
  Administered 2023-11-19 – 2023-11-26 (×21): 3.375 g via INTRAVENOUS
  Filled 2023-11-19 (×21): qty 50

## 2023-11-19 MED ORDER — ADULT MULTIVITAMIN W/MINERALS CH
1.0000 | ORAL_TABLET | Freq: Every day | ORAL | Status: DC
  Administered 2023-11-19 – 2023-11-28 (×10): 1 via ORAL
  Filled 2023-11-19 (×10): qty 1

## 2023-11-19 MED ORDER — ENSURE ENLIVE PO LIQD
237.0000 mL | Freq: Two times a day (BID) | ORAL | Status: DC
Start: 2023-11-20 — End: 2023-11-28
  Administered 2023-11-21 – 2023-11-27 (×8): 237 mL via ORAL

## 2023-11-19 MED ORDER — PIPERACILLIN-TAZOBACTAM 3.375 G IVPB 30 MIN
3.3750 g | Freq: Once | INTRAVENOUS | Status: AC
  Administered 2023-11-19: 3.375 g via INTRAVENOUS
  Filled 2023-11-19: qty 50

## 2023-11-19 MED ORDER — GADOBUTROL 1 MMOL/ML IV SOLN
8.0000 mL | Freq: Once | INTRAVENOUS | Status: AC | PRN
  Administered 2023-11-19: 8 mL via INTRAVENOUS

## 2023-11-19 MED ORDER — VITAMIN C 500 MG PO TABS
500.0000 mg | ORAL_TABLET | Freq: Every day | ORAL | Status: DC
  Administered 2023-11-19 – 2023-11-28 (×10): 500 mg via ORAL
  Filled 2023-11-19 (×10): qty 1

## 2023-11-19 MED ORDER — ACETAMINOPHEN 650 MG RE SUPP: 650.0000 mg | Freq: Four times a day (QID) | RECTAL | Status: DC | PRN

## 2023-11-19 MED ORDER — JUVEN PO PACK
1.0000 | PACK | Freq: Two times a day (BID) | ORAL | Status: DC
  Administered 2023-11-21 – 2023-11-24 (×6): 1 via ORAL
  Filled 2023-11-19 (×7): qty 1

## 2023-11-19 MED ORDER — NALOXONE HCL 0.4 MG/ML IJ SOLN
0.4000 mg | INTRAMUSCULAR | Status: DC | PRN
Start: 2023-11-19 — End: 2023-11-28

## 2023-11-19 MED ORDER — VANCOMYCIN HCL 2000 MG/400ML IV SOLN
2000.0000 mg | Freq: Once | INTRAVENOUS | Status: AC
Start: 2023-11-19 — End: 2023-11-19
  Administered 2023-11-19: 2000 mg via INTRAVENOUS
  Filled 2023-11-19: qty 400

## 2023-11-19 NOTE — Consult Note (Addendum)
 WOC Nurse Consult Note: WOC consult requested for left foot wound. Pt is currently being followed by the podiatry team; refer to Dr Higinio Plan progress note this morning.  He has ordered the following topical treatment for the bedside nurses to perform: Beatdine deressing to Left foot change daily. Please refer to podiatry team for further questions.  Please re-consult if further assistance is needed.  Thank-you,  Cammie Mcgee MSN, RN, CWOCN, [place], CNS [phone]

## 2023-11-19 NOTE — Progress Notes (Addendum)
 PROGRESS NOTE   Harout Scheurich  ZOX:[id number]    DOB: 10-Sep-1955    DOA: 11/18/2023  PCP: System, Provider Not In   I have briefly reviewed patients previous medical records in [facility].  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  MRI of foot negative for osteomyelitis but per podiatry, despite negative MRI likely will require first metatarsal head resection probably 4/4 pending ABI results.   Assessment & Plan:  Principal Problem:   Diabetic foot infection (HCC) Active Problems:   Hyponatremia   AKI (acute kidney injury) (HCC)   Insulin dependent type 2 diabetes mellitus (HCC)   Pyogenic inflammation of bone (HCC)   Diabetic left foot infection Poorly controlled DM, chronic left plantar first MPJ wound, seen in podiatrist office 4/1 when underwent wound debridement and advised hospital admission for MRI, vascular workup, IV antibiotics and possible surgical intervention Sepsis ruled out on admission.  Lactate x 2 negative. MRI of left foot without osteomyelitis, septic arthritis cannot be ruled out, rest as in detailed report elsewhere. Empirically started on IV Zosyn and vancomycin, continue ABI results pending, depending on these may need vascular surgery consultation. Sees Dr. Marylene Land, Podiatry. at [facility] in [place] Dr. Annamary Rummage, podiatry input appreciated and plan likely surgery for 4/4. Abnormal b/l TBI, Dr. Annamary Rummage recommends VVS consult, called  Poorly controlled type II DM with peripheral neuropathy A1c 8.9 on 4/2 Had a mild hypoglycemic episode in  the 60s while n.p.o. this morning, treated with OJ with improvement For now continue sensitive NovoLog SSI and adjust insulins as needed Await pharmacy to complete home med rec, polypharmacy noted on current med list including couple of long-acting insulins, Actos etc.  Acute on chronic kidney disease stage II Baseline creatinine of 1.25 in October 24.  Presented with creatinine of 1.45 which is increased to 1.65 May be an element of dehydration but could also be progression of his CKD. Follow BMP in a.m. post IV fluid hydration Avoid nephrotoxics.  Not sure if he is on lisinopril at home, hold for now.  Hyponatremia Secondary to hyperglycemia and dehydration.  Serum osmolarity up.  Follow BMP.  Body mass index is 25.1 kg/m.   DVT prophylaxis: SCDs Start: 11/19/23 0316     Code Status: Full Code:  Family Communication: None at bedside Disposition:  Status is: Inpatient Remains inpatient appropriate because: Need for IV antibiotics due to diabetic left foot infection and possible surgery on 4/4.     Consultants:   Podiatry/Dr. Annamary Rummage  Procedures:     Antimicrobials:   As noted above   Subjective:  Right foot pain, received meds this morning.  Also complains of back pain due to lying in uncomfortable bed in ED.  Seen while he was still in ED this morning.  Per nursing, refusing to keep telemetry on, do not see need for it.  Objective:   Vitals:   11/19/23 0500 11/19/23 0645 11/19/23 0745 11/19/23 1124  BP: 121/79 119/78 105/73 (!) 140/88  Pulse: 82 84 86 76  Resp: (!) 34 16  18  Temp:    (!) 97.5 F (36.4 C)  TempSrc:    Oral  SpO2: 97% 98% 99% 100%  Weight:      Height:        General exam: Middle-age male, looks older than stated age, moderately built and nourished lying comfortably supine in bed without distress.  Oral mucosa with borderline hydration. Respiratory system: Clear to auscultation. Respiratory effort normal. Cardiovascular system: S1 & S2  heard, RRR. No JVD, murmurs, rubs, gallops or clicks. No pedal edema. Gastrointestinal system: Abdomen is nondistended, soft and nontender. No organomegaly or masses felt. Normal bowel sounds heard. Central nervous system: Alert and oriented. No focal neurological deficits. Extremities: Symmetric 5 x 5 power. Skin: Left foot dressing clean and dry.  Did not open to examine since this was examined by admitting MD earlier this morning.  Details as in picture below from admission. Psychiatry: Judgement and insight appear normal. Mood & affect appropriate.     Data Reviewed:   I have personally reviewed following labs and imaging studies   CBC: Recent Labs  Lab 11/18/23 1823 11/19/23 0428  WBC 8.7 8.9  NEUTROABS 5.9  --   HGB 14.6 13.9  HCT 43.6 42.5  MCV 85.2 87.8  PLT 270 247    Basic Metabolic Panel: Recent Labs  Lab 11/18/23 1823 11/19/23 0428  NA 130* 132*  K 5.1 4.7  CL 98 104  CO2 22 19*  GLUCOSE 217* 131*  BUN 47* 50*  CREATININE 1.45* 1.60*  CALCIUM 9.4 9.0    Liver Function Tests: Recent Labs  Lab 11/18/23 1823  AST 18  ALT 17  ALKPHOS 39  BILITOT 0.3  PROT 7.0  ALBUMIN 3.6    CBG: Recent Labs  Lab 11/19/23 1013 11/19/23 1057 11/19/23 1200  GLUCAP 67* 142* 112*    Microbiology Studies:  No results found for this or any previous visit (from the past 240 hours).  Radiology Studies:  MR FOOT LEFT W WO CONTRAST Result Date: 11/19/2023 CLINICAL DATA:  Plantar ulceration of left forefoot overlying the first MTP joint. EXAM: MRI OF THE LEFT FOREFOOT WITHOUT AND WITH CONTRAST TECHNIQUE: Multiplanar, multisequence MR imaging of the left forefoot. Was performed both before and after administration of intravenous contrast. CONTRAST:  8mL GADAVIST GADOBUTROL 1 MMOL/ML IV SOLN COMPARISON:  Left foot radiographs dated 11/18/2023. MRI of the left toes dated 04/11/2023. FINDINGS: Bones/Joint/Cartilage Plantar ulceration overlying the first MTP joint. There is  marginal spurring of the underlying first MTP joint with similar erosion or chronic osteochondral lesion of the base of the first proximal phalanx, slightly increased articular surface irregularity with trace endosteal edema at the distal articular margin of the first metatarsal head and the proximal articular margin of the first proximal phalanx. There is trace fluid within the first MTP joint with mild enhancement. These findings could reflect mild progression of advanced degenerative arthropathy of the first MTP joint, however, septic arthritis can not be excluded. No definite evidence of underlying osteomyelitis. The remainder of the visualized bones demonstrate normal marrow signal. 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 Ligaments Lisfranc ligament is intact. Muscles and Tendons Thickening and increased signal is again noted involving the flexor hallucis longus tendon, most notably at the level of the hallux sesamoid. The remainder of the flexor and extensor tendons are intact. Intrinsic muscles of the foot demonstrate mild atrophy and diffuse edema. Soft tissue Plantar ulceration overlying the first MTP joint with associated foci of susceptibility artifact, which may relate to interval debridement. There is surrounding soft tissue edema  and enhancement, compatible with cellulitis. No discrete focal fluid collection identified. Subcutaneous edema extending along the dorsal mid to lateral forefoot. IMPRESSION: 1. 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 No definite evidence of osteomyelitis. 2. Plantar ulceration of the forefoot, overlying the first MTP joint, with findings concerning for surrounding cellulitis and associated foci of susceptibility artifact which could relate to interval debridement. No abscess identified. 3. Similar marked flexor hallucis longus  tendinosis at the level of the lateral hallux sesamoid. 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 4. Mild atrophy and edema of the intrinsic musculature of the foot, myositis can not be excluded. Electronically Signed   By: Hart Robinsons M.D.   On: 11/19/2023 08:34   DG Foot Complete Left Result Date: 11/18/2023 Please see detailed radiograph report in office note.   Scheduled Meds:    insulin aspart  0-9 Units Subcutaneous TID WC    Continuous Infusions:    sodium chloride 125 mL/hr at 11/19/23 1156   piperacillin-tazobactam (ZOSYN)  IV Stopped (11/19/23 1046)   [START ON 11/20/2023] vancomycin       LOS: 0 days     Marcellus Scott, MD,  FACP, [facility], [facility], [facility]   [facility] [place]      To contact the attending provider between 7A-7P or the covering provider during after hours 7P-7A, please log into the web site [email or web address] and access using universal [place] password for that web site. If you do not have the password, please call the hospital operator.  11/19/2023, 12:59 PM

## 2023-11-19 NOTE — Consult Note (Addendum)
 Hospital Consult    Reason for Consult: Left great toe wound Requesting Physician: [facility] MRN #:  [id number]  History of Present Illness: This is a 68 y.o. male with past medical history significant for insulin-dependent diabetes mellitus.  He is being seen in consultation for evaluation of left great toe wound.  Workup included MR which was inconclusive for evidence of osteomyelitis of the first metatarsal.  Left ABI greater than 1 with a toe pressure of 52 mmHg.  He denies claudication.  He also denies any rest pain.  He states the wound has been present on his great toe for the last year.  He denies tobacco use.  Patient lives in [place] he does not have any family in the area.  Past Medical History:  Diagnosis Date   Diabetes mellitus (HCC)     History reviewed. No pertinent surgical history.  No Known Allergies  Prior to Admission medications   Medication Sig Start Date End Date Taking? Authorizing Provider  amitriptyline (ELAVIL) 50 MG tablet Take 25-50 mg by mouth at bedtime. Take 1 Tablet by mouth nightly at bedtime Take 1/2 tablet for 1 week, then increase to a whole tablet 11/06/23   [provider]  clobetasol (TEMOVATE) 0.05 % external solution Apply 1 Application topically 2 (two) times daily as needed. 07/07/23   [provider]  empagliflozin (JARDIANCE) 25 MG TABS tablet Take 1 tablet by mouth daily. 09/05/23   [provider]  gabapentin (NEURONTIN) 300 MG capsule Take 300 mg by mouth 3 (three) times daily. Patient not taking: Reported on 11/18/2023    [provider]  insulin glargine (LANTUS) 100 UNIT/ML injection Inject 70 Units into the skin daily.    [provider]  insulin lispro (HUMALOG) 100 UNIT/ML injection Inject 10 Units into the skin 3 (three) times daily with meals. Inject 10 Units into the skin 3 (three) times daily before meals Or as directed by sliding scale 11/06/23   [provider]  LANTUS SOLOSTAR 100  UNIT/ML Solostar Pen Inject 50 Units into the skin 2 (two) times daily. 11/06/23   [provider]  lisinopril (ZESTRIL) 10 MG tablet Take 1 tablet by mouth daily. 05/19/23   [provider]  Metoclopramide HCl (REGLAN PO) Take by mouth.    [provider]  ondansetron (ZOFRAN-ODT) 8 MG disintegrating tablet Take 8 mg by mouth every 8 (eight) hours as needed for nausea. 06/03/23   [provider]  pioglitazone (ACTOS) 15 MG tablet Take 1 tablet by mouth daily. 11/06/23   [provider]  QUEtiapine (SEROQUEL) 50 MG tablet Take 50 mg by mouth at bedtime. 06/03/23   [provider]  rOPINIRole (REQUIP) 2 MG tablet Take 2 mg by mouth at bedtime. 09/07/23   [provider]  SEMGLEE, YFGN, 100 UNIT/ML Pen Inject 50 Units into the skin 2 (two) times daily. 11/06/23   [provider]  triamcinolone cream (KENALOG) 0.1 % Apply 1 Application topically 2 (two) times daily as needed (itchiness/inflammation). 07/07/23   [provider]  triamcinolone lotion (KENALOG) 0.1 % Apply 1 Application topically 3 (three) times daily. 06/03/23   [provider]    Social History   Socioeconomic History   Marital status: Unknown    Spouse name: Not on file   Number of children: Not on file   Years of education: Not on file   Highest education level: Not on file  Occupational History   Not on file  Tobacco Use  Smoking status: Never   Smokeless tobacco: Never  Vaping Use   Vaping status: Never Used  Substance and Sexual Activity   Alcohol use: Never   Drug use: Never   Sexual activity: Not Currently  Other Topics Concern   Not on file  Social History Narrative   Not on file   Social Drivers of Health   Financial Resource Strain: Not at Risk (08/07/2023)   Received from [employer]    Financial Resource Strain: 1  Food Insecurity: No Food Insecurity (11/19/2023)   Hunger Vital Sign    Worried  About Running Out of Food in the Last Year: Never true    Ran Out of Food in the Last Year: Never true  Transportation Needs: No Transportation Needs (11/19/2023)   PRAPARE - Administrator, Civil Service (Medical): No    Lack of Transportation (Non-Medical): No  Physical Activity: At Risk (08/07/2023)   Received from [facility]   Physical Activity    Physical Activity: 2  Stress: Not at Risk (08/07/2023)   Received from [facility]   Stress    Stress: 1  Social Connections: Moderately Integrated (11/19/2023)   Social Connection and Isolation Panel [NHANES]    Frequency of Communication with Friends and Family: More than three times a week    Frequency of Social Gatherings with Friends and Family: Never    Attends Religious Services: More than 4 times per year    Active Member of [employer] or Organizations: Yes    Attends Banker Meetings: 1 to 4 times per year    Marital Status: Widowed  Intimate Partner Violence: Not At Risk (11/19/2023)   Humiliation, Afraid, Rape, and Kick questionnaire    Fear of Current or Ex-Partner: No    Emotionally Abused: No    Physically Abused: No    Sexually Abused: No     History reviewed. No pertinent family history.  ROS: Otherwise negative unless mentioned in HPI  Physical Examination  Vitals:   11/19/23 0745 11/19/23 1124  BP: 105/73 (!) 140/88  Pulse: 86 76  Resp:  18  Temp:  (!) 97.5 F (36.4 C)  SpO2: 99% 100%   Body mass index is 25.1 kg/m.  General:  WDWN in NAD Gait: Not observed HENT: WNL, normocephalic Pulmonary: normal non-labored breathing, without Rales, rhonchi,  wheezing Cardiac: regular Abdomen:  soft, NT/ND, no masses Skin: without rashes Vascular Exam/Pulses: Palpable 2+ femoral pulses; palpable right DP pulse; 1+ left PT pulse Extremities:  Musculoskeletal: no muscle wasting or atrophy  Neurologic: A&O X 3;  No focal weakness or paresthesias are detected; speech is fluent/normal Psychiatric:  The pt  has Normal affect. Lymph:  Unremarkable  CBC    Component Value Date/Time   WBC 8.9 11/19/2023 0428   RBC 4.84 11/19/2023 0428   HGB 13.9 11/19/2023 0428   HCT 42.5 11/19/2023 0428   PLT 247 11/19/2023 0428   MCV 87.8 11/19/2023 0428   MCH 28.7 11/19/2023 0428   MCHC 32.7 11/19/2023 0428   RDW 13.2 11/19/2023 0428   LYMPHSABS 1.5 11/18/2023 1823   MONOABS 1.0 11/18/2023 1823   EOSABS 0.2 11/18/2023 1823   BASOSABS 0.1 11/18/2023 1823    BMET    Component Value Date/Time   NA 132 (L) 11/19/2023 0428   K 4.7 11/19/2023 0428   CL 104 11/19/2023 0428   CO2 19 (L) 11/19/2023 0428   GLUCOSE 131 (H) 11/19/2023 0428   BUN 50 (  H) 11/19/2023 0428   CREATININE 1.60 (H) 11/19/2023 0428   CALCIUM 9.0 11/19/2023 0428   GFRNONAA 47 (L) 11/19/2023 0428    COAGS: No results found for: "INR", "PROTIME"   Non-Invasive Vascular Imaging:   MRI left foot without definitive evidence of osteomyelitis  ABI/TBIToday's ABIToday's TBIPrevious ABIPrevious TBI  +-------+-----------+-----------+------------+------------+  Right 0.86       0.54                                 +-------+-----------+-----------+------------+------------+  Left  1.18       0.39                                     ASSESSMENT/PLAN: This is a 68 y.o. male with insulin-dependent diabetes mellitus and a 1 year history of left great toe ulceration  Mr. Earnie Caraher is a 68 year old male being seen in consultation for evaluation of left great toe wound.  He is without claudication or rest pain.  Workup included left ABI greater than 1 however he does have a toe pressure of 53 mmHg.  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  Agree with IV fluids to optimize renal function.  I discussed with the patient that this wound represents critical limb ischemia.  We will plan to proceed with aortogram with focus on the left  lower extremity tomorrow with Dr. Chestine Spore.  Plans noted for left great toe amputation with podiatry on Friday.  We will also add a daily 81 mg aspirin as well as a pharmacy consult for lipid therapy.  On-call vascular surgeon Dr. Myra Gianotti will evaluate the patient later today and provide further treatment plans.   Emilie Rutter PA-C [facility] [phone]  I agree with the above.  I have seen and evaluated the patient.  Briefly this is a 68 year old male who we have been asked to evaluate for a left foot wound.  He is a diabetic which is poorly controlled with an A1c of 9..  He has a ABI greater than 1 but a toe pressure of 52.  There is a biphasic posterior tibial signal and a monophasic dorsalis pedis signal he does not have symptoms of claudication.  He has palpable femoral pulses.  I could not palpate pedal pulses.  He does have mild renal insufficiency.  I discussed proceeding with angiography via a right femoral approach to evaluate the blood flow to his left leg.  We discussed intervention if indicated.  All of his questions were answered.  We will get this scheduled for tomorrow.  He will need to be n.p.o. after midnight.  Durene Cal

## 2023-11-19 NOTE — Plan of Care (Signed)
  Problem: Pain Managment: Goal: General experience of comfort will improve and/or be controlled Outcome: Progressing   Problem: Safety: Goal: Ability to remain free from injury will improve Outcome: Progressing   Problem: Skin Integrity: Goal: Risk for impaired skin integrity will decrease Outcome: Progressing

## 2023-11-19 NOTE — H&P (Signed)
 History and Physical    Jeremiah Burns UEA:[id number] DOB: [birth year] DOA: 11/18/2023  PCP: System, Provider Not In  Patient coming from: Home  Chief Complaint: Left foot infection  HPI: Jeremiah Burns is a 68 y.o. male with medical history significant of poorly controlled insulin-dependent type 2 diabetes with recent A1c 10.0 on 11/06/2023 seen by podiatry yesterday for infected diabetic ulcer of left forefoot underneath the first metatarsal head.  X-ray done in the office was concerning for osteomyelitis versus septic arthritis and his wound was debrided.  He was sent to the ED for admission for IV antibiotics and MRI.  He did have palpable pulses but podiatry recommended vascular workup.  Patient reports having left foot ulcer for the past 1 year.  States he went to see podiatry yesterday and was told that it looked infected so he was sent to the hospital.  Denies fevers.  He reports history of diabetes for which he is on basal and preprandial insulin.  Patient states he does not eat much as he is afraid his blood sugar will increase.  He has no other complaints.  Denies shortness breath, chest pain, nausea, vomiting, abdominal pain, or diarrhea.  ED Course: Slightly tachycardic but afebrile and not hypotensive.  Labs showing no leukocytosis, sodium 130, glucose 217, BUN 47, creatinine 1.4 (baseline 1.2), lactate normal x 2, blood cultures collected.  MRI of left foot ordered. Patient was given vancomycin and Zosyn.  Review of Systems:  Review of Systems  All other systems reviewed and are negative.   History reviewed. No pertinent past medical history.  History reviewed. No pertinent surgical history.   reports that he has never smoked. He has never used smokeless tobacco. No history on file for alcohol use and drug use.  No Known Allergies  History reviewed. No pertinent family history.  Prior to Admission medications   Medication Sig Start Date End Date Taking? Authorizing  Provider  gabapentin (NEURONTIN) 300 MG capsule Take 300 mg by mouth 3 (three) times daily. Patient not taking: Reported on 11/18/2023    [provider]  insulin glargine (LANTUS) 100 UNIT/ML injection Inject 70 Units into the skin daily.    [provider]  Metoclopramide HCl (REGLAN PO) Take by mouth.    [provider]    Physical Exam: Vitals:   11/18/23 1810 11/18/23 1816 11/18/23 2243 11/19/23 0038  BP:   121/85 136/88  Pulse:   (!) 105 (!) 101  Resp:   15 17  Temp: 98.7 F (37.1 C)  98.6 F (37 C) 98.8 F (37.1 C)  TempSrc: Oral   Oral  SpO2:   97% 100%  Weight:  81.6 kg    Height:  5\' 11"  (1.803 m)      Physical Exam Vitals reviewed.  Constitutional:      General: He is not in acute distress. HENT:     Head: Normocephalic and atraumatic.  Eyes:     Extraocular Movements: Extraocular movements intact.  Cardiovascular:     Rate and Rhythm: Normal rate and regular rhythm.     Pulses: Normal pulses.  Pulmonary:     Effort: Pulmonary effort is normal. No respiratory distress.     Breath sounds: Normal breath sounds. No wheezing or rales.  Abdominal:     General: Bowel sounds are normal. There is no distension.     Palpations: Abdomen is soft.     Tenderness: There is no abdominal tenderness. There is no guarding.  Musculoskeletal:  Cervical back: Normal range of motion.     Comments: Left foot: Dorsalis pedis pulse intact and capillary refill brisk.  Skin:    General: Skin is warm and dry.  Neurological:     General: No focal deficit present.     Mental Status: He is alert and oriented to person, place, and time.     Labs on Admission: I have personally reviewed following labs and imaging studies  CBC: Recent Labs  Lab 11/18/23 1823  WBC 8.7  NEUTROABS 5.9  HGB 14.6  HCT 43.6  MCV 85.2  PLT 270   Basic Metabolic Panel: Recent Labs  Lab 11/18/23 1823  NA 130*  K 5.1  CL 98  CO2 22  GLUCOSE 217*  BUN 47*   CREATININE 1.45*  CALCIUM 9.4   GFR: Estimated Creatinine Clearance: 52.7 mL/min (A) (by C-G formula based on SCr of 1.45 mg/dL (H)). Liver Function Tests: Recent Labs  Lab 11/18/23 1823  AST 18  ALT 17  ALKPHOS 39  BILITOT 0.3  PROT 7.0  ALBUMIN 3.6   No results for input(s): "LIPASE", "AMYLASE" in the last 168 hours. No results for input(s): "AMMONIA" in the last 168 hours. Coagulation Profile: No results for input(s): "INR", "PROTIME" in the last 168 hours. Cardiac Enzymes: No results for input(s): "CKTOTAL", "CKMB", "CKMBINDEX", "TROPONINI" in the last 168 hours. BNP (last 3 results) No results for input(s): "PROBNP" in the last 8760 hours. HbA1C: No results for input(s): "HGBA1C" in the last 72 hours. CBG: No results for input(s): "GLUCAP" in the last 168 hours. Lipid Profile: No results for input(s): "CHOL", "HDL", "LDLCALC", "TRIG", "CHOLHDL", "LDLDIRECT" in the last 72 hours. Thyroid Function Tests: No results for input(s): "TSH", "T4TOTAL", "FREET4", "T3FREE", "THYROIDAB" in the last 72 hours. Anemia Panel: No results for input(s): "VITAMINB12", "FOLATE", "FERRITIN", "TIBC", "IRON", "RETICCTPCT" in the last 72 hours. Urine analysis: No results found for: "COLORURINE", "APPEARANCEUR", "LABSPEC", "PHURINE", "GLUCOSEU", "HGBUR", "BILIRUBINUR", "KETONESUR", "PROTEINUR", "UROBILINOGEN", "NITRITE", "LEUKOCYTESUR"  Radiological Exams on Admission: DG Foot Complete Left Result Date: 11/18/2023 Please see detailed radiograph report in office note.   Assessment and Plan  Diabetic foot infection Likely related to poorly controlled diabetes.  X-ray done at podiatry office yesterday was concerning for osteomyelitis versus septic arthritis and his wound was debrided.  Patient sent here by podiatry for admission for IV antibiotics, MRI, and vascular workup.  Patient is slightly tachycardic but does not meet any other SIRS criteria at this time.  Lactate normal x 2.  MRI has  been done and results pending.  Continue vancomycin and Zosyn.  Keep n.p.o. and continue pain management.  Trend WBC count and follow-up blood cultures.  No signs of neurovascular compromise at this time.  Dorsalis pedis pulse palpable.  ABIs ordered.  Mild AKI BUN elevated and creatinine slightly above baseline.  Poor p.o. intake/dehydration could be contributing.  Continue IV fluid hydration and monitor labs.  Avoid nephrotoxic agents.  Mild hyponatremia IV fluid hydration and monitor labs.  Check serum osmolarity.  Poorly controlled insulin-dependent type 2 diabetes Recent A1c 10.0 on 11/06/2023.  Placed on sensitive sliding scale insulin every 4 hours as patient is currently NPO.  Resume home basal insulin after pharmacy med rec is done.  DVT prophylaxis: SCDs Code Status: Full Code (discussed with the patient) Family Communication: No family available at this time. Level of care: Telemetry bed Admission status: It is my clinical opinion that admission to INPATIENT is reasonable and necessary because of the expectation that  this patient will require hospital care that crosses at least 2 midnights to treat this condition based on the medical complexity of the problems presented.  Given the aforementioned information, the predictability of an adverse outcome is felt to be significant.  John Giovanni MD [facility]  If 7PM-7AM, please contact night-coverage [email or web address]  11/19/2023, 2:27 AM

## 2023-11-19 NOTE — Progress Notes (Signed)
 ED Pharmacy Antibiotic Sign Off An antibiotic consult was received from an ED provider for vancomycin and zosyn per pharmacy dosing for acute osteomyelitis. A chart review was completed to assess appropriateness.   The following one time order(s) were placed:  Vancomycin 2g Zosyn 3.375g  Further antibiotic and/or antibiotic pharmacy consults should be ordered by the admitting provider if indicated.   Thank you for allowing pharmacy to be a part of this patient's care.   Marja Kays, [facility]  Clinical Pharmacist 11/19/23 12:11 AM

## 2023-11-19 NOTE — Progress Notes (Signed)
   11/19/23 1321  TOC Brief Assessment  Insurance and Status Reviewed  Patient has primary care physician Yes (Abigail Devries)  Home environment has been reviewed self  Prior level of function: independent with cane  Prior/Current Home Services No current home services  Social Drivers of Health Review SDOH reviewed interventions complete  Readmission risk has been reviewed Yes  Transition of care needs no transition of care needs at this time     Spoke with patient at bedside. Patient from home alone. Has cane no other DME.   Consult for Home health / DME needs / medication etc. Will await plan of care and PT/OT evaluations ( not ordered yet)     Transition of Care Department (TOC) has reviewed patient and no TOC needs have been identified at this time. We will continue to monitor patient advancement through interdisciplinary progression rounds. If new patient transition needs arise, please place a TOC consult.

## 2023-11-19 NOTE — Discharge Instructions (Signed)
[facility] assistance programs. If you are behind on your bills and expenses, and need some help to make it through a short term hardship or financial emergency, there are several organizations and charities in the [place] area that may be able to help. They range from the [employer], [employer], Landscape architect of [employer] and the local community action agency, the [employer], [employer]. These groups may be able to provide you resources to help pay your utility bills, rent, and they even offer housing assistance.  Crisis assistance program Find help for paying your rent, electric bills, free food, and even funds to pay your mortgage. The [employer] (850) 408-9249) offers several services to local families, as funding allows. The Emergency Assistance Program (EAP), which they administer, provides household goods, free food, clothing, and financial aid to people in need in the [facility] area. The EAP program does have some qualification, and counselors will interview clients for financial assistance by written referral only. Referrals need to be made by the Department of Social Services or by other EAP approved human services agencies or charities in the area.  Money for resources for emergency assistance are available for security deposits for rent, water, electric, and gas, past due rent, utility bills, past due mortgage payments, food, and clothing. The [employer] also operates a Programme researcher, broadcasting/film/video on the site. More [employer] Global.  [employer] of [employer], which can be reached at [phone], offers emergency assistance programs for those in need of help, such as food, rent assistance, a soup kitchen, shelter, and clothing. They are based in [facility] but provide a number of services to those that qualify for assistance. Continue with [employer]  programs.  [facility] Department of Social Services may be able to offer temporary financial assistance and cash grants for paying rent and utilities. Help may be provided for local county residents who may be experiencing personal crisis when other resources, including government programs, are not available. Call [phone]            [employer], which is based in [place], provides financial assistance of up to $50.00 to help pay for rent, utilities, cooling bills, rent, and prescription medications. The program also provides secondhand furniture to those in need. [phone]  [employer] is a Geneticist, molecular. The organization can offer emergency assistance for paying rent, electric bills, utilities, food, household products and furniture. They offer extensive emergency and transitional housing for families, children and single women, and also run a Boy's and Dole Food. [address] [employer], [employer], and other aid offered too. [address] [place], [place] [address], [phone]  Additional locations of the [employer] are in [place] and other nearby communities. When you have an emergency, need free food, money for basic needs, or just need assistance around Christmas, then the [employer] may have the resources you need. Or they can refer you to nearby agencies. Learn more.  [place] Low Income Risk manager - This is offered for [facility] families. The federal government created [employer] Program provides a one-time cash grant payment to help eligible low-income families pay their electric and heating bills. [address] [place], [place] [address], [phone]  Government and [employer] - The county administers several emergency and self-sufficiency programs. Residents of [place]  can get help with energy bills and food, rent, and other  expenses. In addition, work with a Sports coach who may be able to help you find a job or improve your employment skills. More [place] public assistance.  [place] Emergency Assistance - A program offers emergency utility and rent funds for greater [employer] area residents. The program can also provide counseling and referrals to charities and government programs. Also provides food and a free meal program that serves lunch Mondays - Saturdays and dinner seven days per week to individuals in the community. [address] [place], [place] [address], [phone]  [employer] - Offers affordable apartment and housing communities across [place] and [place]. The low income and seniors can access public housing, rental assistance to qualified applicants, and apply for the section 8 rent subsidy program. Other programs include Chiropractor and Engineer, maintenance. [address] [place], [place] [address], dial [phone].  Basic needs such as clothing - Low income families can receive free items (school supplies, clothes, holiday assistance, etc.) from clothing closets while more moderate income [address] families can shop at [employer]. Locations across the area help the needy. Get information on [place] free clothing centers.  The [facility] provides transitional housing to veterans and the disabled. Clients will also access other services too, including life skills classes, case management, and assistance in finding permanent housing. [address], [place], [place] [address], call [phone]  [facility] Transitional Housing in [place] is for people who were just evicted or that are formerly homeless. The non-profit will also help then gain self-sufficiency, find a home or apartment to live in, and also provides information on rent assistance when needed.  Dial [phone]  [employer] Partnership to End Homelessness is available in [place]. Families that were evicted or that are homeless can gain shelter, food, clothing, furniture, and also emergency financial assistance. Other services include financial skills and life skills coaching, job training, and case management. [address], [place], [place] [address]. Telephone [phone].  The [employer], [employer]. runs the [employer]. This can help people save money on their heating and summer cooling bills, and is free to low income families. Free upgrades can be made to your home. Phone [phone]  Many of the non-profits and programs mentioned above are all inclusive, meaning they can meet many needs of the low income, such as energy bills, food, rent, and more. However there are several organizations that focus just on rent and housing. Read more on rent assistance in [place] region.  Legal assistance for evictions, foreclosure, and more If you need free legal advice on civili issues, such as foreclosures, evictions, Electronics engineer, government programs, domestic issues and more, Armed forces operational officer Aid of [facility]) is a Associate Professor firm that provides free legal services and counsel to lower income people, seniors, disabled, and others. The goal is to ensure everyone has access to justice and fair representation.  Call them at ([phone], or click here to learn more about [place] free legal assistance programs.  [place] [employer] and funds for emergency expenses The [employer] is another organization that can provide people with [employer] and funds to pay bills. Their assistance depends on funding, and the demand for help is always very high. They can provide cash to help pay rent, a missed mortgage payment, or gas, electric, and water bills. But the  assistance doesn't stop there. They also have a food pantry on site, which can  provide food once every three (3) months to people who need help. The [employer] can also offer a Engineering geologist once every three (3) months for a maximum three (3) times. After receiving this voucher over that period of time, applicants can receive this aid one every six (6) months after that. [phone].  Kohl's action agency The [employer], [employer]. offers job and Dispensing optician. Resources are focused on helping students obtain the skills and experiences that are necessary to compete in today's challenging and tight job market. The non-profit faith-based community action agency offers internship trainings as well as classroom instruction. Economically disadvantaged and challenged individuals and potential employers can use their services. Classes are tailored to meet the needs of people in the [facility] region. [place], [place] [address], [phone]               Foreclosure prevention services Housing Counseling and Education is also offered by [employer] of the [place]. The agency (phone number is below) is a Engineer, structural providing foreclosure advice and counseling. They offer mortgage resolution counseling and also reverse mortgage counseling. Counselors can direct people to both [employer]ark, as well as [employer] foreclosure assistance options.  Warehouse manager has locations in [place] and [place]. They run debt and foreclosure prevention programs for local families. A sampling of the programs offered include both Budget and Housing Counseling. This includes money management, financial advice, budget review and development of a written action plan with a Pensions consultant to help solve specific individual financial problems. In addition, housing and mortgage counselors can also provide pre-  and post-purchase homeownership counseling, default resolution counseling (to prevent foreclosure) and reverse mortgage counseling. A Debt Management Program allows people and families with a high level of credit card or medical debt to consolidate and repay consumer debt and loans to creditors and rebuild positive credit ratings and scores. [phone]  Debt assistance programs Receive free counseling and debt help from [facility] of the [place]. The [facility] based agency can be reached at [phone]. The counselors provide free help, and the services include budget counseling. This will help people manage their expenses and set goals. They also offer a Forensic scientist, which will help individuals consolidate their debts and become debt free. Most of the workshops and services are free.  Community clinics in [place] of the leading health and dental centers are listed below. They may be able to provide medication, physicals, dental care, and general family care to residents of all incomes and backgrounds across the region. Some of the programs focus on the low income and underinsured. However if these clinics can't meet your needs, find information and details on more clinics in [facility].  Some of the options include [employer] of [employer]. This center provides free or low cost health care to low-income adults 18 - 64, who have no health insurance. Among other services offered include a pharmacy and eye clinic. Phone [phone]  [facility], which is located in [place], is a community clinic that provides primary medical and health care to uninsured and underinsured adults and families, as well as the low income, in the greater [place] area on a sliding-fee scale. Call [phone]  [place] [facility] -  They run a dental assistance program that is organized by [facility] to provide  dental services and aid to [place] residents. Services offered by the dental clinic are limited to extractions, pain management, and minor restorative care. [phone]  [place] [facility] has locations in [place] and [place]. The community clinics provide complete pediatric care including primary health, mental health, social work, neurology, cardiology, asthma. Dial [phone].  In addition to those [address] and [facility] community clinics, find other free community clinics in [place] and across the county.  Food pantry and assistance Some of the local food pantries and distribution centers to call for free food and groceries include The Hive of [place] (phone [phone]), The [facility] (phone [phone]) and also [employer]PL Corporation[employer] Assistance Incorporated. Dial [phone].  Several other food banks in the region provide clothing, free food and meals, access to soup kitchens and other help. Find the addresses and phone numbers of more food pantries in [place]. http://www.needhelppayingbills.com/html/[place]_assistance_pro.html

## 2023-11-19 NOTE — Progress Notes (Signed)
 Pt arrived to 6n07 from the ED. See assessment.

## 2023-11-19 NOTE — Progress Notes (Signed)
   11/19/23 1318  SDOH Interventions  Utilities Interventions Inpatient TOC   Resources placed on AVS

## 2023-11-19 NOTE — Consult Note (Signed)
 PODIATRY CONSULTATION  NAME Jeremiah Burns MRN [id number] DOB April 21, 1956 DOA 11/18/2023   Reason for consult:  Chief Complaint  Patient presents with   Wound Infection    Attending/Consulting physician: A. Hongalgi MD  History of present illness: "Jeremiah Burns is a 68 y.o. male with medical history significant of poorly controlled insulin-dependent type 2 diabetes with recent A1c 10.0 on 11/06/2023 seen by podiatry yesterday for infected diabetic ulcer of left forefoot underneath the first metatarsal head.  X-ray done in the office was concerning for osteomyelitis versus septic arthritis and his wound was debrided.  He was sent to the ED for admission for IV antibiotics and MRI.  He did have palpable pulses but podiatry recommended vascular workup.   Patient reports having left foot ulcer for the past 1 year.  States he went to see podiatry yesterday and was told that it looked infected so he was sent to the hospital.  Denies fevers.  He reports history of diabetes for which he is on basal and preprandial insulin.  Patient states he does not eat much as he is afraid his blood sugar will increase.  He has no other complaints.  Denies shortness breath, chest pain, nausea, vomiting, abdominal pain, or diarrhea."  Patient appears to have been managed by Dr. Eda Burns at [facility] in [place]  - she ordered an MRI  that was completed on 04/11/23 didn't show any OM. He reports she didn't do anything and the wound got worse.      Past Medical History:  Diagnosis Date   Diabetes mellitus (HCC)        Latest Ref Rng & Units 11/19/2023    4:28 AM 11/18/2023    6:23 PM  CBC  WBC 4.0 - 10.5 K/uL 8.9  8.7   Hemoglobin 13.0 - 17.0 g/dL 40.9  81.1   Hematocrit 39.0 - 52.0 % 42.5  43.6   Platelets 150 - 400 K/uL 247  270        Latest Ref Rng & Units 11/19/2023    4:28 AM 11/18/2023    6:23 PM  BMP  Glucose 70 - 99 mg/dL 914  782   BUN 8 - 23 mg/dL 50  47   Creatinine 9.56 - 1.24  mg/dL 2.13  0.86   Sodium 578 - 145 mmol/L 132  130   Potassium 3.5 - 5.1 mmol/L 4.7  5.1   Chloride 98 - 111 mmol/L 104  98   CO2 22 - 32 mmol/L 19  22   Calcium 8.9 - 10.3 mg/dL 9.0  9.4       Physical Exam: Lower Extremity Exam Vasc: L - PT 1/4 palpable, DP 1/4 palpable.    Derm: L - ulceration plantar aspect 1st MPJ with purulence  in centra aspect   MSK:     L - edmea about 1st MPJ   Neuro:    L - Gross sensation diminished. Gross motor function intact    ASSESSMENT/PLAN OF CARE 68 y.o. 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 No definite evidence of osteomyelitis.  ABI PVR: read pending, TBI 0.39, monophasic waveforms  - Despite negative MRI likely will require 1st met head resection timing probably Friday.  - Vascular consult  pending ABI results  - Continue  IV abx broad spectrum pending further culture data - Anticoagulation: hold pending OR - Wound care: Beatdine deressing to Left foot change daily - WB status: WBAT to LLE - Will continue to follow   Thank you for the consult.  Please contact me directly with any questions or concerns.           Jeremiah Burns, DPM [facility]    [address] [place], [place] [address]                Office [phone]  Fax [phone]

## 2023-11-19 NOTE — Progress Notes (Signed)
 Pharmacy Antibiotic Note  Cora Stetson is a 68 y.o. male admitted on 11/18/2023 with osteomyelitis. Referred from outpatient podiatry for possible acute infection. MRI of foot pending. Pharmacy has been consulted for zosyn and vancomycin dosing.  Plan: Zosyn 3.375g q8h Vancomycin 1500mg  q24h (eAUC 530, Scr 1.45) F/u renal function, infectious work up and length of therapy Vancomycin levels as needed  Height: 5\' 11"  (180.3 cm) Weight: 81.6 kg (180 lb) IBW/kg (Calculated) : 75.3  Temp (24hrs), Avg:98.7 F (37.1 C), Min:98.6 F (37 C), Max:98.8 F (37.1 C)  Recent Labs  Lab 11/18/23 1823 11/18/23 1840 11/19/23 0013  WBC 8.7  --   --   CREATININE 1.45*  --   --   LATICACIDVEN  --  0.9 0.7    Estimated Creatinine Clearance: 52.7 mL/min (A) (by C-G formula based on SCr of 1.45 mg/dL (H)).    No Known Allergies  Thank you for allowing pharmacy to be a part of this patient's care.  Marja Kays 11/19/2023 3:35 AM

## 2023-11-19 NOTE — ED Provider Notes (Signed)
 [place] [facility] AT [facility] Provider Note   CSN: [id number] Arrival date & time: 11/18/23  1747     History  Chief Complaint  Patient presents with   Wound Infection    Jeremiah Burns is a 68 y.o. male.  The history is provided by the patient and medical records.   68 year old male with history of diabetes, presenting to the ED from podiatrist office due to concern of osteomyelitis of the foot.  Patient has had chronic wound of the left foot for over a year now.  He has been following with podiatry for same.  He did have debridement, but wound does not seem to be healing.  He has had some increased pain, worsening drainage, and foul odor.  Seen at podiatrist office earlier today and had x-ray concerning for osteolysis and cortical disruption of the first MTP joint concerning for osteomyelitis.  Per podiatry, recommended to come to the ER for MRI of the foot, IV antibiotics, and possible eval by vascular surgery in the morning.  Home Medications Prior to Admission medications   Medication Sig Start Date End Date Taking? Authorizing Provider  gabapentin (NEURONTIN) 300 MG capsule Take 300 mg by mouth 3 (three) times daily. Patient not taking: Reported on 11/18/2023    [provider]  insulin glargine (LANTUS) 100 UNIT/ML injection Inject 70 Units into the skin daily.    [provider]  Metoclopramide HCl (REGLAN PO) Take by mouth.    [provider]      Allergies    Patient has no known allergies.    Review of Systems   Review of Systems  Skin:  Positive for wound.  All other systems reviewed and are negative.   Physical Exam Updated Vital Signs BP 121/85 (BP Location: Right Arm)   Pulse (!) 105   Temp 98.6 F (37 C)   Resp 15   Ht 5\' 11"  (1.803 m)   Wt 81.6 kg   SpO2 97%   BMI 25.10 kg/m   Physical Exam Vitals and nursing note reviewed.  Constitutional:      Appearance: He is well-developed.  HENT:      Head: Normocephalic and atraumatic.  Eyes:     Conjunctiva/sclera: Conjunctivae normal.     Pupils: Pupils are equal, round, and reactive to light.  Cardiovascular:     Rate and Rhythm: Normal rate and regular rhythm.     Heart sounds: Normal heart sounds.  Pulmonary:     Effort: Pulmonary effort is normal.     Breath sounds: Normal breath sounds.  Abdominal:     General: Bowel sounds are normal.     Palpations: Abdomen is soft.  Musculoskeletal:        General: Normal range of motion.     Cervical back: Normal range of motion.     Comments: Ulceration to sole of left great toe MTP area, see photo below  Skin:    General: Skin is warm and dry.  Neurological:     Mental Status: He is alert and oriented to person, place, and time.      ED Results / Procedures / Treatments   Labs (all labs ordered are listed, but only abnormal results are displayed) Labs Reviewed  COMPREHENSIVE METABOLIC PANEL WITH GFR - Abnormal; Notable for the following components:      Result Value   Sodium 130 (*)    Glucose, Bld 217 (*)    BUN 47 (*)  Creatinine, Ser 1.45 (*)    GFR, Estimated 53 (*)    All other components within normal limits  CULTURE, BLOOD (ROUTINE X 2)  CULTURE, BLOOD (ROUTINE X 2)  CBC WITH DIFFERENTIAL/PLATELET  URINALYSIS, W/ REFLEX TO CULTURE (INFECTION SUSPECTED)  I-STAT CG4 LACTIC ACID, ED  I-STAT CG4 LACTIC ACID, ED    EKG None  Radiology DG Foot Complete Left Result Date: 11/18/2023 Please see detailed radiograph report in office note.   Procedures Procedures    CRITICAL CARE Performed by: Garlon Hatchet   Total critical care time: 45 minutes  Critical care time was exclusive of separately billable procedures and treating other patients.  Critical care was necessary to treat or prevent imminent or life-threatening deterioration.  Critical care was time spent personally by me on the following activities: development of treatment plan with patient  and/or surrogate as well as nursing, discussions with consultants, evaluation of patient's response to treatment, examination of patient, obtaining history from patient or surrogate, ordering and performing treatments and interventions, ordering and review of laboratory studies, ordering and review of radiographic studies, pulse oximetry and re-evaluation of patient's condition.   Medications Ordered in ED Medications - No data to display  ED Course/ Medical Decision Making/ A&P                                 Medical Decision Making Amount and/or Complexity of Data Reviewed Labs: ordered. Radiology: ordered and independent interpretation performed. ECG/medicine tests: ordered and independent interpretation performed.  Risk Prescription drug management. Decision regarding hospitalization.   68 year old male sent in for admission from podiatry office due to concern of osteomyelitis of the foot.  Has had nonhealing wound to left sole of her foot for about a year.  He does have poorly controlled diabetes which is likely contributing.  He is afebrile and nontoxic in appearance here.  Ulceration to left foot as above.  His foot remains neurovascularly intact.  Does have good pulses and cap refill.  Labs as above--no leukocytosis or electrolyte derangement.  Did have x-rays done earlier today which I reviewed as well.  He is recommended for admission with IV antibiotics, plan for MRI for further evaluation.  Blood cultures have been sent.  Started broad-spectrum vancomycin and Zosyn.  Discussed with Dr. Loney Loh-- will admit for ongoing care.  Final Clinical Impression(s) / ED Diagnoses Final diagnoses:  Other osteomyelitis of left foot [facility])    Rx / DC Orders ED Discharge Orders     None         Garlon Hatchet, PA-C 11/19/23 0127    Gilda Crease, MD 11/19/23 [phone]

## 2023-11-19 NOTE — ED Notes (Signed)
 Called CCMD to admit patient to monitor.

## 2023-11-19 NOTE — ED Notes (Signed)
 Pt refusing cardiac monitoring. This RN took leads off and placed monitor on standby.

## 2023-11-20 ENCOUNTER — Encounter (HOSPITAL_COMMUNITY)
Admission: EM | Disposition: A | Discharge status: Skilled Nursing Facility | Payer: Self-pay | Source: Ambulatory Visit | Attending: Internal Medicine

## 2023-11-20 ENCOUNTER — Ambulatory Visit (HOSPITAL_COMMUNITY): Admission: RE | Admit: 2023-11-20 | Source: Home / Self Care | Admitting: Vascular Surgery

## 2023-11-20 DIAGNOSIS — L089 Local infection of the skin and subcutaneous tissue, unspecified: Secondary | ICD-10-CM | POA: Diagnosis not present

## 2023-11-20 DIAGNOSIS — M868X7 Other osteomyelitis, ankle and foot: Secondary | ICD-10-CM | POA: Diagnosis not present

## 2023-11-20 DIAGNOSIS — N179 Acute kidney failure, unspecified: Secondary | ICD-10-CM | POA: Diagnosis not present

## 2023-11-20 DIAGNOSIS — I70245 Atherosclerosis of native arteries of left leg with ulceration of other part of foot: Secondary | ICD-10-CM

## 2023-11-20 DIAGNOSIS — E11628 Type 2 diabetes mellitus with other skin complications: Secondary | ICD-10-CM | POA: Diagnosis not present

## 2023-11-20 HISTORY — PX: LOWER EXTREMITY INTERVENTION: CATH118252

## 2023-11-20 HISTORY — PX: LOWER EXTREMITY ANGIOGRAPHY: CATH118251

## 2023-11-20 HISTORY — PX: ABDOMINAL AORTOGRAM: CATH118222

## 2023-11-20 LAB — GLUCOSE, CAPILLARY
Glucose-Capillary: 121 mg/dL — ABNORMAL HIGH (ref 70–99)
Glucose-Capillary: 108 mg/dL — ABNORMAL HIGH (ref 70–99)
Glucose-Capillary: 105 mg/dL — ABNORMAL HIGH (ref 70–99)
Glucose-Capillary: 173 mg/dL — ABNORMAL HIGH (ref 70–99)

## 2023-11-20 LAB — BASIC METABOLIC PANEL WITH GFR
Anion gap: 6 (ref 5–15)
BUN: 33 mg/dL — ABNORMAL HIGH (ref 8–23)
CO2: 22 mmol/L (ref 22–32)
Calcium: 8.3 mg/dL — ABNORMAL LOW (ref 8.9–10.3)
Chloride: 105 mmol/L (ref 98–111)
Creatinine, Ser: 1.42 mg/dL — ABNORMAL HIGH (ref 0.61–1.24)
GFR, Estimated: 54 mL/min — ABNORMAL LOW (ref >60)
Glucose, Bld: 132 mg/dL — ABNORMAL HIGH (ref 70–99)
Potassium: 4 mmol/L (ref 3.5–5.1)
Sodium: 133 mmol/L — ABNORMAL LOW (ref 135–145)

## 2023-11-20 LAB — LIPID PANEL
Cholesterol: 159 mg/dL (ref 0–200)
HDL: 33 mg/dL — ABNORMAL LOW (ref >40)
LDL Cholesterol: 90 mg/dL (ref 0–99)
Total CHOL/HDL Ratio: 4.8 ratio
Triglycerides: 180 mg/dL — ABNORMAL HIGH (ref <150)
VLDL: 36 mg/dL (ref 0–40)

## 2023-11-20 SURGERY — ABDOMINAL AORTOGRAM
Anesthesia: LOCAL

## 2023-11-20 MED ORDER — CLOPIDOGREL BISULFATE 300 MG PO TABS
ORAL_TABLET | ORAL | Status: DC | PRN
  Administered 2023-11-20: 300 mg via ORAL

## 2023-11-20 MED ORDER — FENTANYL CITRATE (PF) 100 MCG/2ML IJ SOLN
INTRAMUSCULAR | Status: AC
Start: 2023-11-20 — End: ?
  Filled 2023-11-20: qty 2

## 2023-11-20 MED ORDER — CLOPIDOGREL BISULFATE 300 MG PO TABS
ORAL_TABLET | ORAL | Status: AC
  Filled 2023-11-20: qty 1

## 2023-11-20 MED ORDER — SODIUM CHLORIDE 0.9% FLUSH: 3.0000 mL | INTRAVENOUS | Status: DC | PRN

## 2023-11-20 MED ORDER — PROSOURCE PLUS PO LIQD
30.0000 mL | Freq: Two times a day (BID) | ORAL | Status: DC
Start: 2023-11-20 — End: 2023-11-26
  Administered 2023-11-21 – 2023-11-24 (×5): 30 mL via ORAL
  Filled 2023-11-20 (×7): qty 30

## 2023-11-20 MED ORDER — HEPARIN (PORCINE) IN NACL 1000-0.9 UT/500ML-% IV SOLN
INTRAVENOUS | Status: DC | PRN
  Administered 2023-11-20 (×2): 500 mL

## 2023-11-20 MED ORDER — ATORVASTATIN CALCIUM 40 MG PO TABS
40.0000 mg | ORAL_TABLET | Freq: Every day | ORAL | Status: DC
  Administered 2023-11-20 – 2023-11-28 (×9): 40 mg via ORAL
  Filled 2023-11-20 (×9): qty 1

## 2023-11-20 MED ORDER — IODIXANOL 320 MG/ML IV SOLN
INTRAVENOUS | Status: DC | PRN
  Administered 2023-11-20: 75 mL

## 2023-11-20 MED ORDER — ACETAMINOPHEN 325 MG PO TABS
650.0000 mg | ORAL_TABLET | ORAL | Status: DC | PRN
  Filled 2023-11-20: qty 2

## 2023-11-20 MED ORDER — SODIUM CHLORIDE 0.9 % IV SOLN: INTRAVENOUS | Status: DC

## 2023-11-20 MED ORDER — FENTANYL CITRATE (PF) 100 MCG/2ML IJ SOLN
INTRAMUSCULAR | Status: DC | PRN
  Administered 2023-11-20 (×3): 25 ug via INTRAVENOUS

## 2023-11-20 MED ORDER — SODIUM CHLORIDE 0.9% FLUSH
3.0000 mL | Freq: Two times a day (BID) | INTRAVENOUS | Status: DC
  Administered 2023-11-20 – 2023-11-28 (×14): 3 mL via INTRAVENOUS

## 2023-11-20 MED ORDER — LABETALOL HCL 5 MG/ML IV SOLN: 10.0000 mg | INTRAVENOUS | Status: DC | PRN

## 2023-11-20 MED ORDER — CLOPIDOGREL BISULFATE 75 MG PO TABS
300.0000 mg | ORAL_TABLET | Freq: Once | ORAL | Status: AC
  Administered 2023-11-20: 300 mg via ORAL
  Filled 2023-11-20: qty 4

## 2023-11-20 MED ORDER — PANTOPRAZOLE SODIUM 40 MG IV SOLR
40.0000 mg | INTRAVENOUS | Status: DC
  Administered 2023-11-20 – 2023-11-22 (×3): 40 mg via INTRAVENOUS
  Filled 2023-11-20 (×3): qty 10

## 2023-11-20 MED ORDER — HYDRALAZINE HCL 20 MG/ML IJ SOLN
5.0000 mg | INTRAMUSCULAR | Status: DC | PRN
  Administered 2023-11-22: 5 mg via INTRAVENOUS
  Filled 2023-11-20: qty 1

## 2023-11-20 MED ORDER — CLOPIDOGREL BISULFATE 75 MG PO TABS
75.0000 mg | ORAL_TABLET | Freq: Every day | ORAL | Status: DC
  Administered 2023-11-21 – 2023-11-28 (×8): 75 mg via ORAL
  Filled 2023-11-20 (×8): qty 1

## 2023-11-20 MED ORDER — HEPARIN SODIUM (PORCINE) 1000 UNIT/ML IJ SOLN
INTRAMUSCULAR | Status: DC | PRN
  Administered 2023-11-20: 8000 [IU] via INTRAVENOUS

## 2023-11-20 MED ORDER — MIDAZOLAM HCL 2 MG/2ML IJ SOLN
INTRAMUSCULAR | Status: AC
  Filled 2023-11-20: qty 2

## 2023-11-20 MED ORDER — HEPARIN SODIUM (PORCINE) 1000 UNIT/ML IJ SOLN
INTRAMUSCULAR | Status: AC
  Filled 2023-11-20: qty 10

## 2023-11-20 MED ORDER — LIDOCAINE HCL (PF) 1 % IJ SOLN
INTRAMUSCULAR | Status: AC
Start: 2023-11-20 — End: ?
  Filled 2023-11-20: qty 30

## 2023-11-20 MED ORDER — SODIUM CHLORIDE 0.9 % IV SOLN: INTRAVENOUS | Status: AC

## 2023-11-20 MED ORDER — SODIUM CHLORIDE 0.9 % IV SOLN: 250.0000 mL | INTRAVENOUS | Status: AC | PRN

## 2023-11-20 MED ORDER — LIDOCAINE HCL (PF) 1 % IJ SOLN
INTRAMUSCULAR | Status: DC | PRN
  Administered 2023-11-20: 15 mL

## 2023-11-20 MED ORDER — ONDANSETRON HCL 4 MG/2ML IJ SOLN
4.0000 mg | Freq: Four times a day (QID) | INTRAMUSCULAR | Status: DC | PRN
  Administered 2023-11-20: 4 mg via INTRAVENOUS
  Filled 2023-11-20: qty 2

## 2023-11-20 MED ORDER — MIDAZOLAM HCL 2 MG/2ML IJ SOLN
INTRAMUSCULAR | Status: DC | PRN
  Administered 2023-11-20: 1 mg via INTRAVENOUS

## 2023-11-20 SURGICAL SUPPLY — 25 items
BALLN STERLING OTW 3X60X150 (BALLOONS) ×1 IMPLANT
BALLOON STERLING OTW 3X60X150 (BALLOONS) IMPLANT
CATH NAVICROSS ST 65CM (CATHETERS) IMPLANT
CATH OMNI FLUSH 5F 65CM (CATHETERS) IMPLANT
CATH QUICKCROSS .018X135CM (MICROCATHETER) IMPLANT
CATH SHOCKWAVE E8 5X80 (CATHETERS) IMPLANT
CATHETER NAVICROSS ST 65CM (CATHETERS) ×1 IMPLANT
COVER DOME SNAP 22 D (MISCELLANEOUS) IMPLANT
DEVICE CLOSURE MYNXGRIP 5F (Vascular Products) IMPLANT
GLIDEWIRE ADV .035X260CM (WIRE) IMPLANT
GUIDEWIRE ANGLED .035X150CM (WIRE) IMPLANT
KIT ENCORE 26 ADVANTAGE (KITS) IMPLANT
KIT MICROPUNCTURE NIT STIFF (SHEATH) IMPLANT
KIT PV (KITS) ×1 IMPLANT
SET ATX-X65L (MISCELLANEOUS) IMPLANT
SHEATH CATAPULT 5F 45 MP (SHEATH) IMPLANT
SHEATH PINNACLE 5F 10CM (SHEATH) IMPLANT
SHEATH PROBE COVER 6X72 (BAG) IMPLANT
SYR MEDRAD MARK 7 150ML (SYRINGE) ×1 IMPLANT
TRANSDUCER W/STOPCOCK (MISCELLANEOUS) ×1 IMPLANT
TRAY PV CATH (CUSTOM PROCEDURE TRAY) ×1 IMPLANT
WIRE BENTSON .035X145CM (WIRE) IMPLANT
WIRE G V18X300CM (WIRE) IMPLANT
WIRE ROSEN-J .035X180CM (WIRE) IMPLANT
WIRE SPARTACORE .014X300CM (WIRE) IMPLANT

## 2023-11-20 NOTE — Progress Notes (Signed)
 Nutrition Follow-up  DOCUMENTATION CODES:   Not applicable  INTERVENTION:   Encourage PO intake  Ensure Enlive po BID, each supplement provides 350 kcal and 20 grams of protein. 1 packet Juven BID, each packet provides 95 calories, 2.5 grams of protein (collagen), support wound healing Prosource Plus BID, each packet contains 100 kcal and 15 gm protein  Continue vitamin C and Zinc for wound healing  Continue MVI with minerals daily   NUTRITION DIAGNOSIS:   Increased nutrient needs related to wound healing as evidenced by estimated needs.   GOAL:   Patient will meet greater than or equal to 90% of their needs   MONITOR:   PO intake, Supplement acceptance, Labs, I & O's, Weight trends, Skin  REASON FOR ASSESSMENT:   Consult Assessment of nutrition requirement/status, Wound healing  ASSESSMENT:  68 y.o male with PMH of uncontrolled T2DM with peripheral neuropathy, CKD II, PAD. Presented from podiatry for further evaluation of left foot infection; diabetic foot ulcer vs osteomyelitis. MRI negative for osteomyelitis.  4/2 - MRI negative for osteomyelitis 4/2 - Angiogram with possible intervention  4/4 - Planned resection of first metatarsal head   Patient endorses nausea this morning and was not enthusiastic on providing nutritional history. Patient lives independently at home. Was able to walk at home even with his wound on his left foot. He reports he has had that wound for about a year. Endorses no weight loss however, reviewed weight history and he was 205 lbs in October 2024, if current weight is accurate he now weighs 180 lbs and has lost 25 lbs,12% in 6 months. Exam deferred due to patients agitation but what was able to be accessed showed no depletions.   Reports he typically eats 2 meals and drinks milk and green tea throughout the day. Patient agitated that he cannot eat right now as he is awaiting angiogram with possible intervention. Was on a heart healthy/carb  modified diet yesterday and only meal that was documented was 100% of lunch.RD unable to obtain supplement preferences, will continue Juven, add Ensure and prosource for wound healing. On follow up RD will determine supplement preferences, evaluate PO intake, and obtain more in depth nutritional history.   Admit weight: 81.6 kg  Current weight: 81.6 kg    Average Meal Intake: 4/2: 100% intake x 1 recorded meals  Nutritionally Relevant Medications: Scheduled Meds:  (feeding supplement) PROSource Plus  30 mL Oral BID BM   ascorbic acid  500 mg Oral Daily   feeding supplement  237 mL Oral BID BM   insulin aspart  0-9 Units Subcutaneous TID WC   multivitamin with minerals  1 tablet Oral Daily   nutrition supplement (JUVEN)  1 packet Oral BID BM   zinc sulfate (50mg  elemental zinc)  220 mg Oral Daily   Continuous Infusions:  sodium chloride 100 mL/hr at 11/20/23 0545   piperacillin-tazobactam (ZOSYN)  IV 3.375 g (11/20/23 0826)   vancomycin 1,500 mg (11/20/23 0150)   Labs Reviewed: Sodium 132, BUN 50, Creatinine 1.60 CBG ranges from 84-146 mg/dL over the last 24 hours HgbA1c 8.9  NUTRITION - FOCUSED PHYSICAL EXAM:  Flowsheet Row Most Recent Value  Orbital Region No depletion  Upper Arm Region No depletion  Thoracic and Lumbar Region No depletion  Buccal Region No depletion  Temple Region No depletion  Clavicle Bone Region Unable to assess  [Agitation]  Clavicle and Acromion Bone Region No depletion  Scapular Bone Region Unable to assess  [Agitation]  Dorsal Hand  Unable to assess  [Agitation]  Patellar Region No depletion  Anterior Thigh Region No depletion  Posterior Calf Region No depletion  Edema (RD Assessment) None  Hair Reviewed  Eyes Reviewed  Mouth Reviewed  Skin Reviewed  Nails Reviewed       Diet Order:   Diet Order             Diet NPO time specified Except for: Sips with Meds  Diet effective midnight           Diet NPO time specified  Diet effective  midnight                   EDUCATION NEEDS:   Not appropriate for education at this time  Skin:  Skin Assessment: Skin Integrity Issues: Skin Integrity Issues:: Diabetic Ulcer Diabetic Ulcer: Left foot  Last BM:  11/19/23  Height:   Ht Readings from Last 1 Encounters:  11/18/23 5\' 11"  (1.803 m)    Weight:   Wt Readings from Last 1 Encounters:  11/18/23 81.6 kg    Ideal Body Weight:  78.2 kg  BMI:  Body mass index is 25.1 kg/m.  Estimated Nutritional Needs:   Kcal:  2200-2400 kcal  Protein:  120-140 gm  Fluid:  >2L/day   Elliot Dally, RD Registered Dietitian  See Amion for more information

## 2023-11-20 NOTE — Progress Notes (Addendum)
  Progress Note    11/20/2023 7:30 AM Hospital Day 1  Subjective:  resting without complaints.  He does not have any questions.   afebrile  Vitals:   11/20/23 0019 11/20/23 0507  BP: (!) 145/106 118/66  Pulse: 78 63  Resp: 18 18  Temp: 97.8 F (36.6 C) (!) 97.5 F (36.4 C)  SpO2: 100% 96%    Physical Exam: General:  no distress Lungs:  non labored   CBC    Component Value Date/Time   WBC 8.9 11/19/2023 0428   RBC 4.84 11/19/2023 0428   HGB 13.9 11/19/2023 0428   HCT 42.5 11/19/2023 0428   PLT 247 11/19/2023 0428   MCV 87.8 11/19/2023 0428   MCH 28.7 11/19/2023 0428   MCHC 32.7 11/19/2023 0428   RDW 13.2 11/19/2023 0428   LYMPHSABS 1.5 11/18/2023 1823   MONOABS 1.0 11/18/2023 1823   EOSABS 0.2 11/18/2023 1823   BASOSABS 0.1 11/18/2023 1823    BMET    Component Value Date/Time   NA 132 (L) 11/19/2023 0428   K 4.7 11/19/2023 0428   CL 104 11/19/2023 0428   CO2 19 (L) 11/19/2023 0428   GLUCOSE 131 (H) 11/19/2023 0428   BUN 50 (H) 11/19/2023 0428   CREATININE 1.60 (H) 11/19/2023 0428   CALCIUM 9.0 11/19/2023 0428   GFRNONAA 47 (L) 11/19/2023 0428    INR No results found for: "INR"   Intake/Output Summary (Last 24 hours) at 11/20/2023 0730 Last data filed at 11/20/2023 0545 Gross per 24 hour  Intake 236 ml  Output 1375 ml  Net -1139 ml     Assessment/Plan:  68 y.o. male with left foot wounds  Hospital Day 1  -pt with left foot wounds.  Plan for angiogram today with possible intervention.   -Creatinine yesterday 1.6 and labs pending for today.  On IVF at 100cc/hr -received Lovenox last evening and now on hold until Saturday.  -podiatry following as well   Doreatha Massed, PA-C [facility] [phone] 11/20/2023 7:30 AM  I agree with the above.  Creatinine 1.4 today.  Plan for angiography later today.  Podiatry planning surgery tomorrow.  Durene Cal

## 2023-11-20 NOTE — Plan of Care (Signed)
  Problem: Coping: Goal: Ability to adjust to condition or change in health will improve Outcome: Progressing   Problem: Fluid Volume: Goal: Ability to maintain a balanced intake and output will improve Outcome: Progressing   Problem: Health Behavior/Discharge Planning: Goal: Ability to manage health-related needs will improve Outcome: Progressing   Problem: Nutritional: Goal: Maintenance of adequate nutrition will improve Outcome: Progressing   Problem: Skin Integrity: Goal: Risk for impaired skin integrity will decrease Outcome: Progressing

## 2023-11-20 NOTE — Progress Notes (Signed)
 Patient brought to 4E from cath lab. VSS. Telemetry box applied, CCMD notified. Patient oriented to room and staff. Call bell in reach.  Kenard Gower, RN

## 2023-11-20 NOTE — Progress Notes (Signed)
 PT Cancellation Note  Patient Details Name: Jeremiah Burns MRN: [id number] DOB: 1956-03-17   Cancelled Treatment:    Reason Eval/Treat Not Completed: Patient at procedure or test/unavailable, at angiogram, will continue to follow and evaluate after planned surgical intervention.   Vickki Muff, PT, DPT   Acute Rehabilitation Department Office [phone] Secure Chat Communication Preferred   Ronnie Derby 11/20/2023, 4:04 PM

## 2023-11-20 NOTE — Evaluation (Signed)
 Occupational Therapy Evaluation Patient Details Name: Jeremiah Burns MRN: [id number] DOB: November 03, 1955 Today's Date: 11/20/2023   History of Present Illness   Pt is a 68 y.o male presented to ED 11/18/23 after podiatrist recommendation d/t diabetic ulcer on L foot. 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 No definite evidence  of osteomyelitis. Plan for L great toe amputation 4/4.WBAT. PMH; DM2 with neuropathy, PAD     Clinical Impressions Pt admitted based on above, and was seen based on problem list below. PTA pt was living alone and was independent with ADLs and IADLs. Today pt is requiring set up  to s for safety for ADLs. Bed mobility and functional transfers are  s for safety. During session pt presenting as impulsive and poor safety awareness and judgement. Recommendation for HHOT to promote safe d/Burns home. OT will continue to follow acutely to maximize functional independence.     If plan is discharge home, recommend the following:   A little help with walking and/or transfers;A little help with bathing/dressing/bathroom;Assistance with cooking/housework     Functional Status Assessment   Patient has had a recent decline in their functional status and demonstrates the ability to make significant improvements in function in a reasonable and predictable amount of time.     Equipment Recommendations   BSC/3in1      Precautions/Restrictions   Precautions Precautions: Fall Recall of Precautions/Restrictions: Intact Restrictions Weight Bearing Restrictions Per Provider Order: Yes LLE Weight Bearing Per Provider Order: Weight bearing as tolerated     Mobility Bed Mobility Overal bed mobility: Needs Assistance Bed Mobility: Supine to Sit, Sit to Supine     Supine to sit: Supervision, Used rails Sit to supine: Supervision, Used rails   General bed mobility comments: From flat bed use of rails     Transfers Overall transfer level: Needs assistance Equipment used: Straight cane Transfers: Sit to/from Stand, Bed to chair/wheelchair/BSC Sit to Stand: Supervision, From elevated surface     Step pivot transfers: Supervision     General transfer comment: S for safety, poor safety with use of Cane      Balance Overall balance assessment: Needs assistance Sitting-balance support: No upper extremity supported, Feet supported Sitting balance-Leahy Scale: Good     Standing balance support: Single extremity supported, During functional activity Standing balance-Leahy Scale: Fair       ADL either performed or assessed with clinical judgement   ADL Overall ADL's : Needs assistance/impaired Eating/Feeding: Set up;Sitting   Grooming: Wash/dry hands;Supervision/safety;Standing Grooming Details (indicate cue type and reason): Able to lean against sink to stabilze         Upper Body Dressing : Set up;Sitting   Lower Body Dressing: Set up;Sit to/from stand Lower Body Dressing Details (indicate cue type and reason): Able to cross legs to don socks Toilet Transfer: Supervision/safety;Regular Toilet;Grab bars Toilet Transfer Details (indicate cue type and reason): Relied on GB to power up         Functional mobility during ADLs: Supervision/safety;Cane       Vision Baseline Vision/History: 1 Wears glasses Vision Assessment?: No apparent visual deficits            Pertinent Vitals/Pain Pain Assessment Pain Assessment: No/denies pain     Extremity/Trunk Assessment Upper Extremity Assessment Upper Extremity Assessment: Overall WFL for tasks assessed   Lower Extremity Assessment Lower Extremity Assessment: Defer to PT evaluation   Cervical / Trunk Assessment Cervical / Trunk Assessment: Normal   Communication  Communication Communication: No apparent difficulties   Cognition Arousal: Alert Behavior During Therapy: Agitated, Impulsive Cognition: No apparent  impairments             OT - Cognition Comments: Pt perseverating on being NPO                 Following commands: Intact       Cueing  General Comments   Cueing Techniques: Verbal cues;Tactile cues  VSS on RA           Home Living Family/patient expects to be discharged to:: Private residence Living Arrangements: Alone Available Help at Discharge:  (None) Type of Home: House Home Access: Stairs to enter [employer] of Steps: 3 Entrance Stairs-Rails: Can reach both Home Layout: One level     Bathroom Shower/Tub: Producer, television/film/video: Standard Bathroom Accessibility: Yes How Accessible: Accessible via walker Home Equipment: Cane - single point;Shower seat - built in;Grab bars - tub/shower;Hand held shower head          Prior Functioning/Environment Prior Level of Function : Independent/Modified Independent;Driving              OT Problem List: Decreased strength;Decreased range of motion;Decreased activity tolerance;Impaired balance (sitting and/or standing);Decreased safety awareness;Decreased knowledge of use of DME or AE   OT Treatment/Interventions: Self-care/ADL training;Therapeutic exercise;DME and/or AE instruction;Therapeutic activities;Patient/family education;Balance training      OT Goals(Current goals can be found in the care plan section)   Acute Rehab OT Goals Patient Stated Goal: To eat OT Goal Formulation: With patient Time For Goal Achievement: 12/04/23 Potential to Achieve Goals: Good   OT Frequency:  Min 2X/week       AM-PAC OT "6 Clicks" Daily Activity     Outcome Measure Help from another person eating meals?: None Help from another person taking care of personal grooming?: A Little Help from another person toileting, which includes using toliet, bedpan, or urinal?: A Little Help from another person bathing (including washing, rinsing, drying)?: A Little Help from another person to put on  and taking off regular upper body clothing?: A Little Help from another person to put on and taking off regular lower body clothing?: A Little 6 Click Score: 19   End of Session Equipment Utilized During Treatment: Other (comment) (Single point cane) Nurse Communication: Mobility status  Activity Tolerance: Patient tolerated treatment well Patient left: in bed;with call bell/phone within reach  OT Visit Diagnosis: Unsteadiness on feet (R26.81);Other abnormalities of gait and mobility (R26.89);Muscle weakness (generalized) (M62.81)                Time: 6213-0865 OT Time Calculation (min): 17 min Charges:  OT General Charges $OT Visit: 1 Visit OT Evaluation $OT Eval Moderate Complexity: 1 Mod  Jeremiah Burns, OT  Acute Rehabilitation Services Office [phone] Secure chat preferred   Jeremiah Burns 11/20/2023, 3:11 PM

## 2023-11-20 NOTE — Progress Notes (Signed)
   PODIATRY PROGRESS NOTE Patient Name: Jeremiah Burns  DOB May 31, 1956 DOA 11/18/2023  Hospital Day: 3  Assessment:  68 y.o. 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 No definite evidence of osteomyelitis.  CRP < 0.5 ESR 25 A1c 8.9   ABI PVR: TBI 0.39, monophasic waveforms  Plan:  -  Will consider more limited resection of platnar aspect of 1st met head and base of proximal phalanx with wound debridement and graft application along with bone biopsy. NPO past MN for OR tomorrow around lunch for above.  - Vascular planning angiogram today, appreciate involvement - Continue IV abx broad spectrum pending further culture data - Anticoagulation: hold pending OR - Wound care: Beatdine deressing to Left foot change daily - WB status: WBAT to LLE in post op shoe - Will continue to follow          Corinna Gab, DPM [facility]    Subjective:  Discussed with patient plans for OR tomorrow. He is in agreement aware of Npo status past MN tonight for OR around 12 tmrw.   Objective:   Vitals:   11/20/23 0800 11/20/23 1138  BP: (!) 158/84 (!) 140/69  Pulse: 73 66  Resp: 17 16  Temp: (!) 97.4 F (36.3 C) (!) 97.4 F (36.3 C)  SpO2: 100% 100%       Latest Ref Rng & Units 11/19/2023    4:28 AM 11/18/2023    6:23 PM  CBC  WBC 4.0 - 10.5 K/uL 8.9  8.7   Hemoglobin 13.0 - 17.0 g/dL 04.5  40.9   Hematocrit 39.0 - 52.0 % 42.5  43.6   Platelets 150 - 400 K/uL 247  270        Latest Ref Rng & Units 11/20/2023    7:13 AM 11/19/2023    4:28 AM 11/18/2023    6:23 PM  BMP  Glucose 70 - 99 mg/dL 811  914  782   BUN 8 - 23 mg/dL 33  50  47   Creatinine 0.61 - 1.24 mg/dL 9.56  2.13  0.86   Sodium 135 - 145 mmol/L 133  132  130   Potassium 3.5 - 5.1 mmol/L  4.0  4.7  5.1   Chloride 98 - 111 mmol/L 105  104  98   CO2 22 - 32 mmol/L 22  19  22    Calcium 8.9 - 10.3 mg/dL 8.3  9.0  9.4     General: AAOx3, NAD  Lower Extremity Exam Vasc:     L - PT 1/4 palpable, DP 1/4 palpable.              Derm:    L - ulceration plantar aspect 1st MPJ with purulence  in centra aspect    MSK:                     L - edmea about 1st MPJ    Neuro:                   L - Gross sensation diminished. Gross motor function intact     Radiology:  Results reviewed. See assessment for pertinent imaging results

## 2023-11-20 NOTE — Plan of Care (Signed)
  Problem: Skin Integrity: Goal: Risk for impaired skin integrity will decrease Outcome: Progressing   Problem: Tissue Perfusion: Goal: Adequacy of tissue perfusion will improve Outcome: Progressing   Problem: Education: Goal: Ability to describe self-care measures that may prevent or decrease complications (Diabetes Survival Skills Education) will improve Outcome: Not Progressing   Problem: Coping: Goal: Ability to adjust to condition or change in health will improve Outcome: Not Progressing   Problem: Health Behavior/Discharge Planning: Goal: Ability to manage health-related needs will improve Outcome: Not Progressing

## 2023-11-20 NOTE — Op Note (Signed)
 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  Ultrasound-guided access right common femoral artery 2.  Aortogram with catheter selection of aorta 3.  Left lower extremity angiogram with catheter selection of the left Popliteal artery and TP trunk 4.  Left posterior tibial artery angioplasty (3 mm x 60 mm Sterling) 5.  Left above-knee popliteal artery shockwave angioplasty (5 mm x 80 mm shockwave x 200 pulses) 6.  Mynx closure of the right common femoral artery 7.  51 minutes of monitored moderate conscious sedation time  Indications: Patient is a 68 year old male seen with tissue loss in his left lower extremity.  Vascular surgery was consulted for evaluation of inflow for wound healing.  He presents for lower extremity arteriogram after risks-benefit discussed.  Findings:   Ultrasound-guided access right common femoral artery.  Aortogram showed patent infrarenal aorta with patent renals and patent SMA.  Both iliacs were patent without flow-limiting stenosis.    On the left he had a patent common femoral, profunda, and SFA without flow-limiting stenosis.  The above-knee popliteal artery had a focal 70% calcified stenosis.  Dominant tibial runoff was in the peroneal that was patent and gave a large collateral at the ankle to the DP.  The anterior tibial is patent proximally but then occludes in the distal calf.  The posterior tibial had a proximal 99% focal stenosis with the more distal vessel filling retrograde.  Ultimately I got down the left posterior tibial artery antegrade and this was treated with a 3 mm x 60 mm Sterling to nominal pressure for 2 minutes.  Much more brisk filling of the PT antegrade with now two-vessel runoff in the peroneal and posterior tibial.  The  above-knee popliteal stenosis was treated with shockwave lithotripsy x 200 pulses using a 5 mm shockwave balloon.   Procedure:  The patient was identified in the holding area and taken to room 8.  The patient was then placed supine on the table and prepped and draped in the usual sterile fashion.  A time out was called.  The patient received Versed and fentanyl for conscious moderate sedation.  Vital signs were monitored including heart rate, respiratory rate, oxygenation and blood pressure.  I was present for all of moderate sedation.  Ultrasound was used to evaluate the right common femoral artery.  It was patent .  A digital ultrasound image was acquired.  A micropuncture needle was used to access the right common femoral artery under ultrasound guidance.  An 018 wire was advanced without resistance and a micropuncture sheath was placed.  The 018 wire was removed and a benson wire was placed.  The micropuncture sheath was exchanged for a 5 french sheath.  An omniflush catheter was advanced over the wire to the level of L-1.  An abdominal angiogram was obtained.  Next, using the omniflush catheter and a benson wire, the aortic bifurcation was crossed and the catheter was placed into theleft external iliac artery and left runoff was obtained.  Ultimately elected for intervention.  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  A Glidewire advantage was used with the Omni Flush catheter down the left SFA and upsized to a long 5 [place] catapult sheath in the right groin over the aortic bifurcation.  The patient was given 100 units/kg IV heparin.  I then  went down the left SFA antegrade and got a catheter and wire into the popliteal artery and TP trunk with hand injection.  I was able to get down the posterior tibial antegrade through the high-grade stenosis with a V18 wire.  The proximal PT was treated with a 3 mm x 60 Miller Sterling for 2 minutes.  Much better flow down the PT.  I then went to the  above-knee popliteal artery and this was treated with shockwave lithotripsy after changing for Spartacore wire with a 6 mm x 80 mm shockwave lithotripsy balloon initially inflated to 2 atm and I did a total of 80 pulses.  I then inflated to 4 atm and did 120 pulses for 200 pulses total.  Optimized with excellent runoff with preserved runoff at completion.  Short 5 French sheath was placed in the right groin with a minx closure device.  Plan: Excellent results.  Optimized from vascular surgery standpoint.  Inline flow with two-vessel runoff.  Aspirin Plavix statin.   Cephus Shelling, MD [facility] [place] Office: [phone]

## 2023-11-20 NOTE — Progress Notes (Signed)
 PROGRESS NOTE   Dock Baccam  JSE:[id number]    DOB: 28-Nov-1955    DOA: 11/18/2023  PCP: System, Provider Not In   I have briefly reviewed patients previous medical records in [facility].  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  MRI of foot negative for osteomyelitis but per podiatry, despite negative MRI pending vascular surgery intervention on 4/3.   Assessment & Plan:  Principal Problem:   Diabetic foot infection (HCC) Active Problems:   Hyponatremia   AKI (acute kidney injury) (HCC)   Insulin dependent type 2 diabetes mellitus (HCC)   Pyogenic inflammation of bone (HCC)   Diabetic left foot infection Poorly controlled DM, chronic left plantar first MPJ wound, seen in podiatrist office 4/1 when underwent wound debridement and advised hospital admission for MRI, vascular workup, IV antibiotics and possible surgical intervention Sepsis ruled out on admission.  Lactate x 2 negative. MRI of left foot without osteomyelitis, septic arthritis cannot be ruled out, rest as in detailed report elsewhere. Empirically started on IV Zosyn and vancomycin, continue Sees Dr. Marylene Land, Podiatry. at [facility] in [place] Dr. Annamary Rummage, podiatry input appreciated and plan likely surgery for 4/4. Abnormal b/l TBI, vascular surgery consulted and plan peripheral artery cath with possible intervention on 4/3. Planned surgery outlined in Dr. Higinio Plan note from 4/3.  Poorly controlled type II DM with peripheral neuropathy A1c 8.9 on 4/2 Had a mild hypoglycemic episode in the 60s  while n.p.o. this morning, treated with OJ with improvement For now continue sensitive NovoLog SSI and adjust insulins as needed Reasonable inpatient control on current regimen.  Acute on chronic kidney disease stage II Baseline creatinine of 1.25 in October 24.  Presented with creatinine of 1.45 which was up to 1.65.  Post hydration, down to 1.42. May be an element of dehydration but could also be progression of his CKD. Follow BMP in a.m. after continued IVF and peripheral artery. Avoid nephrotoxics.  Not sure if he is on lisinopril at home, hold for now.  Hyponatremia Secondary to hyperglycemia and dehydration.  Serum osmolarity up.  Follow BMP.  Improved/stable.  Body mass index is 25.1 kg/m.   DVT prophylaxis: enoxaparin (LOVENOX) injection 40 mg Start: 11/22/23 1000 enoxaparin (LOVENOX) injection 40 mg Start: 11/19/23 1500 SCDs Start: 11/19/23 0316     Code Status: Full Code:  Family Communication: None at bedside Disposition:  Status is: Inpatient Remains inpatient appropriate because: Need for IV antibiotics due to diabetic left foot infection and possible surgery on 4/4.     Consultants:   Podiatry/Dr. Annamary Rummage  Procedures:     Antimicrobials:   As noted above   Subjective:  Seen this morning prior to procedure.  Patient was not very pleased about his n.p.o. status and wanted to know how soon he could eat, how long would the procedure last etc.  No physical complaints reported.  Objective:   Vitals:   11/20/23 0507 11/20/23 0800 11/20/23 1138 11/20/23 1446  BP: 118/66 (!) 158/84 (!) 140/69 (!) 148/92  Pulse: 63 73 66 75  Resp: 18 17 16 18   Temp: (!) 97.5 F (36.4 C) (!)  97.4 F (36.3 C) (!) 97.4 F (36.3 C) 97.6 F (36.4 C)  TempSrc: Oral Oral Oral Oral  SpO2: 96% 100% 100% 98%  Weight:      Height:        General exam: Middle-age male, looks older than stated age, moderately built and nourished lying comfortably supine in bed without distress.   Oral mucosa with moist. Respiratory system: Clear to auscultation. Respiratory effort normal. Cardiovascular system: S1 & S2 heard, RRR. No JVD, murmurs, rubs, gallops or clicks. No pedal edema. Gastrointestinal system: Abdomen is nondistended, soft and nontender. No organomegaly or masses felt. Normal bowel sounds heard. Central nervous system: Alert and oriented. No focal neurological deficits. Extremities: Symmetric 5 x 5 power. Skin: Left foot dressing clean and dry.  Details as in picture below from admission. Psychiatry: Judgement and insight appear normal. Mood & affect appropriate.     Data Reviewed:   I have personally reviewed following labs and imaging studies   CBC: Recent Labs  Lab 11/18/23 1823 11/19/23 0428  WBC 8.7 8.9  NEUTROABS 5.9  --   HGB 14.6 13.9  HCT 43.6 42.5  MCV 85.2 87.8  PLT 270 247    Basic Metabolic Panel: Recent Labs  Lab 11/18/23 1823 11/19/23 0428 11/20/23 0713  NA 130* 132* 133*  K 5.1 4.7 4.0  CL 98 104 105  CO2 22 19* 22  GLUCOSE 217* 131* 132*  BUN 47* 50* 33*  CREATININE 1.45* 1.60* 1.42*  CALCIUM 9.4 9.0 8.3*    Liver Function Tests: Recent Labs  Lab 11/18/23 1823  AST 18  ALT 17  ALKPHOS 39  BILITOT 0.3  PROT 7.0  ALBUMIN 3.6    CBG: Recent Labs  Lab 11/20/23 0756 11/20/23 1003 11/20/23 1159  GLUCAP 121* 108* 105*    Microbiology Studies:   Recent Results (from the past 240 hours)  Blood culture (routine x 2)     Status: None (Preliminary result)   Collection Time: 11/19/23 12:34 AM   Specimen: BLOOD RIGHT ARM  Result Value Ref Range Status   Specimen Description BLOOD RIGHT ARM  Final   Special Requests   Final    BOTTLES DRAWN AEROBIC AND ANAEROBIC Blood Culture adequate volume   Culture   Final    NO GROWTH 1 DAY Performed at [facility], [address], [place], [place] [address]    Report Status PENDING  Incomplete  Blood culture (routine x 2)     Status: None (Preliminary result)    Collection Time: 11/19/23 12:34 AM   Specimen: BLOOD LEFT ARM  Result Value Ref Range Status   Specimen Description BLOOD LEFT ARM  Final   Special Requests   Final    BOTTLES DRAWN AEROBIC AND ANAEROBIC Blood Culture adequate volume   Culture   Final    NO GROWTH 1 DAY Performed at [facility], [address] [place], [place] [address]    Report Status PENDING  Incomplete    Radiology Studies:  VAS [place] ABI WITH/WO TBI Result Date: 11/19/2023  LOWER EXTREMITY DOPPLER STUDY Patient Name:  NEWTON FRUTIGER  Date of Exam:   11/19/2023 Medical Rec #: [id number]         Accession #:    [id number] Date of Birth: 05/20/1956         Patient Gender: M Patient Age:   29 years Exam Location:  [facility] Procedure:      VAS [place] ABI WITH/WO TBI  Referring Phys: Ulyess Blossom RATHORE --------------------------------------------------------------------------------  Indications: Ulceration, and peripheral artery disease. High Risk Factors: Diabetes.  Comparison Study: No prior exam. Performing Technologist: Fernande Bras  Examination Guidelines: A complete evaluation includes at minimum, Doppler waveform signals and systolic blood pressure reading at the level of bilateral brachial, anterior tibial, and posterior tibial arteries, when vessel segments are accessible. Bilateral testing is considered an integral part of a complete examination. Photoelectric Plethysmograph (PPG) waveforms and toe systolic pressure readings are included as required and additional duplex testing as needed. Limited examinations for reoccurring indications may be performed as noted.  ABI Findings: +---------+------------------+-----+---------+--------+ Right    Rt Pressure (mmHg)IndexWaveform Comment  +---------+------------------+-----+---------+--------+ Brachial 133                    triphasic         +---------+------------------+-----+---------+--------+ PTA      255               1.88 biphasic           +---------+------------------+-----+---------+--------+ DP       117               0.86 biphasic          +---------+------------------+-----+---------+--------+ Great Toe73                0.54 Abnormal          +---------+------------------+-----+---------+--------+ +---------+------------------+-----+----------+-------+ Left     Lt Pressure (mmHg)IndexWaveform  Comment +---------+------------------+-----+----------+-------+ Brachial 136                    triphasic         +---------+------------------+-----+----------+-------+ PTA      153               1.12 biphasic          +---------+------------------+-----+----------+-------+ DP       160               1.18 monophasic        +---------+------------------+-----+----------+-------+ Great Toe53                0.39 Abnormal          +---------+------------------+-----+----------+-------+ +-------+-----------+-----------+------------+------------+ ABI/TBIToday's ABIToday's TBIPrevious ABIPrevious TBI +-------+-----------+-----------+------------+------------+ Right  0.86       0.54                                +-------+-----------+-----------+------------+------------+ Left   1.18       0.39                                +-------+-----------+-----------+------------+------------+  Summary: Right: Resting right ankle-brachial index indicates mild right lower extremity arterial disease. The right toe-brachial index is abnormal. Left: Resting left ankle-brachial index is within normal range. The left toe-brachial index is abnormal. *See table(s) above for measurements and observations.  Electronically signed by Coral Else MD on 11/19/2023 at 7:01:11 PM.    Final    MR FOOT LEFT W WO CONTRAST Result Date: 11/19/2023 CLINICAL DATA:  Plantar ulceration of left forefoot overlying the first MTP joint. EXAM: MRI OF THE LEFT FOREFOOT WITHOUT AND WITH CONTRAST TECHNIQUE: Multiplanar, multisequence MR imaging  of the left forefoot. Was performed both before and after administration of intravenous contrast. CONTRAST:  8mL GADAVIST GADOBUTROL 1 MMOL/ML IV SOLN COMPARISON:  Left foot radiographs dated  11/18/2023. MRI of the left toes dated 04/11/2023. FINDINGS: Bones/Joint/Cartilage Plantar ulceration overlying the first MTP joint. There is marginal spurring of the underlying first MTP joint with similar erosion or chronic osteochondral lesion of the base of the first proximal phalanx, slightly increased articular surface irregularity with trace endosteal edema at the distal articular margin of the first metatarsal head and the proximal articular margin of the first proximal phalanx. There is trace fluid within the first MTP joint with mild enhancement. These findings could reflect mild progression of advanced degenerative arthropathy of the first MTP joint, however, septic arthritis can not be excluded. No definite evidence of underlying osteomyelitis. The remainder of the visualized bones demonstrate normal marrow signal. 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 Ligaments Lisfranc ligament is intact. Muscles and Tendons Thickening and increased signal is again noted involving the flexor hallucis longus tendon, most notably at the level of the hallux sesamoid. The remainder of the flexor and extensor tendons are intact. Intrinsic muscles of the foot demonstrate mild atrophy and diffuse edema. Soft tissue Plantar ulceration overlying the first MTP joint with associated foci of susceptibility artifact, which may relate to interval debridement. There is surrounding soft tissue edema and enhancement, compatible with cellulitis. No discrete focal fluid collection identified. Subcutaneous edema extending along the dorsal mid to lateral forefoot. IMPRESSION: 1. Findings favored to reflect slight interval progression of advanced degenerative arthropathy of the first MTP joint, however, septic arthritis can not be  entirely excluded. No definite evidence of osteomyelitis. 2. Plantar ulceration of the forefoot, overlying the first MTP joint, with findings concerning for surrounding cellulitis and associated foci of susceptibility artifact which could relate to interval debridement. No abscess identified. 3. Similar marked flexor hallucis longus tendinosis at the level of the lateral hallux sesamoid. 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 4. Mild atrophy and edema of the intrinsic musculature of the foot, myositis can not be excluded. Electronically Signed   By: Hart Robinsons M.D.   On: 11/19/2023 08:34    Scheduled Meds:    (feeding supplement) PROSource Plus  30 mL Oral BID BM   ascorbic acid  500 mg Oral Daily   aspirin EC  81 mg Oral Daily   enoxaparin (LOVENOX) injection  40 mg Subcutaneous Daily   [START ON 11/22/2023] enoxaparin (LOVENOX) injection  40 mg Subcutaneous Daily   feeding supplement  237 mL Oral BID BM   insulin aspart  0-9 Units Subcutaneous TID WC   multivitamin with minerals  1 tablet Oral Daily   nutrition supplement (JUVEN)  1 packet Oral BID BM   pantoprazole (PROTONIX) IV  40 mg Intravenous Q24H   zinc sulfate (50mg  elemental zinc)  220 mg Oral Daily    Continuous Infusions:    sodium chloride 100 mL/hr at 11/20/23 1412   piperacillin-tazobactam (ZOSYN)  IV 3.375 g (11/20/23 0826)   vancomycin 1,500 mg (11/20/23 0150)     LOS: 1 day     Marcellus Scott, MD,  FACP, [facility], [facility], [facility]   [facility] [place]      To contact the attending provider between 7A-7P or the covering provider during after hours 7P-7A, please log into the web site [email or web address] and access using universal [place] password for that web site. If you do not have the password, please call the hospital operator.  11/20/2023, 3:22 PM

## 2023-11-21 ENCOUNTER — Encounter (HOSPITAL_COMMUNITY): Payer: Self-pay | Admitting: Vascular Surgery

## 2023-11-21 ENCOUNTER — Inpatient Hospital Stay (HOSPITAL_COMMUNITY)

## 2023-11-21 ENCOUNTER — Other Ambulatory Visit: Payer: Self-pay

## 2023-11-21 ENCOUNTER — Encounter (HOSPITAL_COMMUNITY)
Admission: EM | Disposition: A | Discharge status: Skilled Nursing Facility | Payer: Self-pay | Source: Ambulatory Visit | Attending: Internal Medicine

## 2023-11-21 DIAGNOSIS — M7752 Other enthesopathy of left foot: Secondary | ICD-10-CM | POA: Diagnosis not present

## 2023-11-21 DIAGNOSIS — M868X7 Other osteomyelitis, ankle and foot: Secondary | ICD-10-CM | POA: Diagnosis not present

## 2023-11-21 DIAGNOSIS — M869 Osteomyelitis, unspecified: Secondary | ICD-10-CM | POA: Diagnosis not present

## 2023-11-21 DIAGNOSIS — L089 Local infection of the skin and subcutaneous tissue, unspecified: Secondary | ICD-10-CM | POA: Diagnosis not present

## 2023-11-21 DIAGNOSIS — E1169 Type 2 diabetes mellitus with other specified complication: Secondary | ICD-10-CM | POA: Diagnosis not present

## 2023-11-21 DIAGNOSIS — E11628 Type 2 diabetes mellitus with other skin complications: Secondary | ICD-10-CM | POA: Diagnosis not present

## 2023-11-21 HISTORY — PX: METATARSAL HEAD EXCISION: SHX5027

## 2023-11-21 LAB — BLOOD CULTURE ID PANEL (REFLEXED) - BCID2

## 2023-11-21 LAB — GLUCOSE, CAPILLARY
Glucose-Capillary: 183 mg/dL — ABNORMAL HIGH (ref 70–99)
Glucose-Capillary: 162 mg/dL — ABNORMAL HIGH (ref 70–99)
Glucose-Capillary: 166 mg/dL — ABNORMAL HIGH (ref 70–99)
Glucose-Capillary: 184 mg/dL — ABNORMAL HIGH (ref 70–99)
Glucose-Capillary: 210 mg/dL — ABNORMAL HIGH (ref 70–99)

## 2023-11-21 LAB — CBC
HCT: 37 % — ABNORMAL LOW (ref 39.0–52.0)
Hemoglobin: 12.6 g/dL — ABNORMAL LOW (ref 13.0–17.0)
MCH: 28.6 pg (ref 26.0–34.0)
MCHC: 34.1 g/dL (ref 30.0–36.0)
MCV: 84.1 fL (ref 80.0–100.0)
Platelets: 230 10*3/uL (ref 150–400)
RBC: 4.4 MIL/uL (ref 4.22–5.81)
RDW: 12.9 % (ref 11.5–15.5)
WBC: 6.6 10*3/uL (ref 4.0–10.5)
nRBC: 0 % (ref 0.0–0.2)

## 2023-11-21 LAB — BASIC METABOLIC PANEL WITH GFR
Anion gap: 9 (ref 5–15)
BUN: 26 mg/dL — ABNORMAL HIGH (ref 8–23)
CO2: 19 mmol/L — ABNORMAL LOW (ref 22–32)
Calcium: 8.1 mg/dL — ABNORMAL LOW (ref 8.9–10.3)
Chloride: 106 mmol/L (ref 98–111)
Creatinine, Ser: 1.37 mg/dL — ABNORMAL HIGH (ref 0.61–1.24)
GFR, Estimated: 57 mL/min — ABNORMAL LOW (ref >60)
Glucose, Bld: 187 mg/dL — ABNORMAL HIGH (ref 70–99)
Potassium: 4.1 mmol/L (ref 3.5–5.1)
Sodium: 134 mmol/L — ABNORMAL LOW (ref 135–145)

## 2023-11-21 LAB — SURGICAL PCR SCREEN
MRSA, PCR: NEGATIVE
Staphylococcus aureus: NEGATIVE

## 2023-11-21 SURGERY — EXCISION, METATARSAL BONE, HEAD
Anesthesia: Monitor Anesthesia Care | Site: Toe | Laterality: Left

## 2023-11-21 MED ORDER — FENTANYL CITRATE (PF) 100 MCG/2ML IJ SOLN
25.0000 ug | INTRAMUSCULAR | Status: DC | PRN
Start: 2023-11-21 — End: 2023-11-21

## 2023-11-21 MED ORDER — ARTIFICIAL TEARS OPHTHALMIC OINT
TOPICAL_OINTMENT | OPHTHALMIC | Status: AC
  Filled 2023-11-21: qty 3.5

## 2023-11-21 MED ORDER — BUPIVACAINE HCL (PF) 0.5 % IJ SOLN
INTRAMUSCULAR | Status: AC
  Filled 2023-11-21: qty 30

## 2023-11-21 MED ORDER — LIDOCAINE 2% (20 MG/ML) 5 ML SYRINGE
INTRAMUSCULAR | Status: AC
  Filled 2023-11-21: qty 5

## 2023-11-21 MED ORDER — BUPIVACAINE HCL (PF) 0.5 % IJ SOLN
INTRAMUSCULAR | Status: DC | PRN
  Administered 2023-11-21: 10 mL via INTRA_ARTICULAR

## 2023-11-21 MED ORDER — ACETAMINOPHEN 10 MG/ML IV SOLN: 1000.0000 mg | Freq: Once | INTRAVENOUS | Status: DC | PRN

## 2023-11-21 MED ORDER — PROPOFOL 10 MG/ML IV BOLUS
INTRAVENOUS | Status: AC
  Filled 2023-11-21: qty 20

## 2023-11-21 MED ORDER — PHENYLEPHRINE 80 MCG/ML (10ML) SYRINGE FOR IV PUSH (FOR BLOOD PRESSURE SUPPORT)
PREFILLED_SYRINGE | INTRAVENOUS | Status: AC
  Filled 2023-11-21: qty 10

## 2023-11-21 MED ORDER — EPHEDRINE 5 MG/ML INJ
INTRAVENOUS | Status: AC
  Filled 2023-11-21: qty 5

## 2023-11-21 MED ORDER — ORAL CARE MOUTH RINSE: 15.0000 mL | Freq: Once | OROMUCOSAL | Status: AC

## 2023-11-21 MED ORDER — GLYCOPYRROLATE PF 0.2 MG/ML IJ SOSY
PREFILLED_SYRINGE | INTRAMUSCULAR | Status: AC
  Filled 2023-11-21: qty 1

## 2023-11-21 MED ORDER — PROPOFOL 10 MG/ML IV BOLUS
INTRAVENOUS | Status: DC | PRN
  Administered 2023-11-21: 20 mg via INTRAVENOUS

## 2023-11-21 MED ORDER — MIDAZOLAM HCL 2 MG/2ML IJ SOLN
INTRAMUSCULAR | Status: DC | PRN
  Administered 2023-11-21 (×2): 1 mg via INTRAVENOUS

## 2023-11-21 MED ORDER — ROCURONIUM BROMIDE 10 MG/ML (PF) SYRINGE
PREFILLED_SYRINGE | INTRAVENOUS | Status: AC
  Filled 2023-11-21: qty 10

## 2023-11-21 MED ORDER — TOBRAMYCIN SULFATE 80 MG/2ML IJ SOLN
INTRAMUSCULAR | Status: AC
  Filled 2023-11-21: qty 4

## 2023-11-21 MED ORDER — LACTATED RINGERS IV SOLN: INTRAVENOUS | Status: DC

## 2023-11-21 MED ORDER — PHENYLEPHRINE 80 MCG/ML (10ML) SYRINGE FOR IV PUSH (FOR BLOOD PRESSURE SUPPORT)
PREFILLED_SYRINGE | INTRAVENOUS | Status: DC | PRN
  Administered 2023-11-21: 80 ug via INTRAVENOUS

## 2023-11-21 MED ORDER — PROPOFOL 500 MG/50ML IV EMUL
INTRAVENOUS | Status: DC | PRN
  Administered 2023-11-21: 120 ug/kg/min via INTRAVENOUS

## 2023-11-21 MED ORDER — LIDOCAINE HCL (PF) 1 % IJ SOLN
INTRAMUSCULAR | Status: AC
Start: 2023-11-21 — End: ?
  Filled 2023-11-21: qty 30

## 2023-11-21 MED ORDER — CHLORHEXIDINE GLUCONATE 0.12 % MT SOLN
15.0000 mL | Freq: Once | OROMUCOSAL | Status: AC
  Administered 2023-11-21: 15 mL via OROMUCOSAL

## 2023-11-21 MED ORDER — DEXAMETHASONE SODIUM PHOSPHATE 10 MG/ML IJ SOLN
INTRAMUSCULAR | Status: AC
  Filled 2023-11-21: qty 2

## 2023-11-21 MED ORDER — FENTANYL CITRATE (PF) 250 MCG/5ML IJ SOLN
INTRAMUSCULAR | Status: DC | PRN
  Administered 2023-11-21: 50 ug via INTRAVENOUS

## 2023-11-21 MED ORDER — ONDANSETRON HCL 4 MG/2ML IJ SOLN
INTRAMUSCULAR | Status: AC
  Filled 2023-11-21: qty 2

## 2023-11-21 MED ORDER — FENTANYL CITRATE (PF) 250 MCG/5ML IJ SOLN
INTRAMUSCULAR | Status: AC
  Filled 2023-11-21: qty 5

## 2023-11-21 MED ORDER — VANCOMYCIN HCL 500 MG IV SOLR
INTRAVENOUS | Status: AC
  Filled 2023-11-21: qty 10

## 2023-11-21 MED ORDER — SODIUM CHLORIDE 0.9 % IR SOLN
Status: DC | PRN
  Administered 2023-11-21: 1000 mL

## 2023-11-21 MED ORDER — SUCCINYLCHOLINE CHLORIDE 200 MG/10ML IV SOSY
PREFILLED_SYRINGE | INTRAVENOUS | Status: AC
  Filled 2023-11-21: qty 10

## 2023-11-21 MED ORDER — MIDAZOLAM HCL 2 MG/2ML IJ SOLN
INTRAMUSCULAR | Status: AC
  Filled 2023-11-21: qty 2

## 2023-11-21 MED ORDER — LIDOCAINE HCL (PF) 1 % IJ SOLN
INTRAMUSCULAR | Status: DC | PRN
  Administered 2023-11-21: 10 mL

## 2023-11-21 SURGICAL SUPPLY — 40 items
BLADE AVERAGE 25X9 (BLADE) IMPLANT
BLADE SURG 10 STRL SS (BLADE) ×1 IMPLANT
BLADE SURG 15 STRL LF DISP TIS (BLADE) ×1 IMPLANT
BNDG COHESIVE 3X5 TAN ST LF (GAUZE/BANDAGES/DRESSINGS) ×1 IMPLANT
BNDG ELASTIC 3INX 5YD STR LF (GAUZE/BANDAGES/DRESSINGS) ×1 IMPLANT
BNDG ELASTIC 4INX 5YD STR LF (GAUZE/BANDAGES/DRESSINGS) IMPLANT
BNDG ESMARK 4X9 LF (GAUZE/BANDAGES/DRESSINGS) ×1 IMPLANT
BNDG GAUZE DERMACEA FLUFF 4 (GAUZE/BANDAGES/DRESSINGS) IMPLANT
CHLORAPREP W/TINT 26 (MISCELLANEOUS) IMPLANT
DRSG ADAPTIC 3X8 NADH LF (GAUZE/BANDAGES/DRESSINGS) IMPLANT
DRSG XEROFORM 1X8 (GAUZE/BANDAGES/DRESSINGS) IMPLANT
ELECT REM PT RETURN 9FT ADLT (ELECTROSURGICAL) ×1 IMPLANT
ELECTRODE REM PT RTRN 9FT ADLT (ELECTROSURGICAL) ×1 IMPLANT
GAUZE PAD ABD 8X10 STRL (GAUZE/BANDAGES/DRESSINGS) IMPLANT
GAUZE SPONGE 2X2 STRL 8-PLY (GAUZE/BANDAGES/DRESSINGS) IMPLANT
GAUZE SPONGE 4X4 12PLY STRL (GAUZE/BANDAGES/DRESSINGS) ×1 IMPLANT
GAUZE STRETCH 2X75IN STRL (MISCELLANEOUS) ×1 IMPLANT
GAUZE XEROFORM 1X8 LF (GAUZE/BANDAGES/DRESSINGS) ×1 IMPLANT
GLOVE BIO SURGEON STRL SZ7.5 (GLOVE) ×1 IMPLANT
GLOVE BIOGEL PI IND STRL 7.5 (GLOVE) ×1 IMPLANT
GOWN STRL REUS W/ TWL LRG LVL3 (GOWN DISPOSABLE) ×2 IMPLANT
KIT BASIN OR (CUSTOM PROCEDURE TRAY) ×1 IMPLANT
NDL BIOPSY JAMSHIDI 8X6 (NEEDLE) IMPLANT
NDL HYPO 25X1 1.5 SAFETY (NEEDLE) ×1 IMPLANT
NDLE BIOPSY JAMSHIDI OT 8X11 (NEEDLE) IMPLANT
NEEDLE BIOPSY JAMSHIDI 8X6 (NEEDLE) IMPLANT
NEEDLE HYPO 25X1 1.5 SAFETY (NEEDLE) ×1 IMPLANT
PACK ORTHO EXTREMITY (CUSTOM PROCEDURE TRAY) ×1 IMPLANT
PADDING CAST ABS COTTON 4X4 ST (CAST SUPPLIES) ×2 IMPLANT
SET HNDPC FAN SPRY TIP SCT (DISPOSABLE) IMPLANT
SPIKE FLUID TRANSFER (MISCELLANEOUS) IMPLANT
STOCKINETTE 4X48 STRL (DRAPES) IMPLANT
SUT ETHILON 3 0 FSLX (SUTURE) IMPLANT
SUT PROLENE 3 0 PS 2 (SUTURE) IMPLANT
SUT PROLENE 4 0 PS 2 18 (SUTURE) IMPLANT
SYR CONTROL 10ML LL (SYRINGE) ×1 IMPLANT
TUBE CONNECTING 12X1/4 (SUCTIONS) IMPLANT
UNDERPAD 30X36 HEAVY ABSORB (UNDERPADS AND DIAPERS) ×1 IMPLANT
WATER STERILE IRR 1000ML POUR (IV SOLUTION) ×1 IMPLANT
YANKAUER SUCT BULB TIP NO VENT (SUCTIONS) IMPLANT

## 2023-11-21 NOTE — Progress Notes (Addendum)
  Progress Note    11/21/2023 8:57 AM 1 Day Post-Op  Subjective:  No complaints   Vitals:   11/21/23 0500 11/21/23 0756  BP:  (!) 145/74  Pulse: 66 61  Resp: 15 18  Temp:  (!) 97.5 F (36.4 C)  SpO2: 97%    Physical Exam: Lungs:  non labored Incisions:  R groin without hematoma Extremities:  brisk multiphasic L PT signal Neurologic: A&O  CBC    Component Value Date/Time   WBC 6.6 11/21/2023 0359   RBC 4.40 11/21/2023 0359   HGB 12.6 (L) 11/21/2023 0359   HCT 37.0 (L) 11/21/2023 0359   PLT 230 11/21/2023 0359   MCV 84.1 11/21/2023 0359   MCH 28.6 11/21/2023 0359   MCHC 34.1 11/21/2023 0359   RDW 12.9 11/21/2023 0359   LYMPHSABS 1.5 11/18/2023 1823   MONOABS 1.0 11/18/2023 1823   EOSABS 0.2 11/18/2023 1823   BASOSABS 0.1 11/18/2023 1823    BMET    Component Value Date/Time   NA 134 (L) 11/21/2023 0359   K 4.1 11/21/2023 0359   CL 106 11/21/2023 0359   CO2 19 (L) 11/21/2023 0359   GLUCOSE 187 (H) 11/21/2023 0359   BUN 26 (H) 11/21/2023 0359   CREATININE 1.37 (H) 11/21/2023 0359   CALCIUM 8.1 (L) 11/21/2023 0359   GFRNONAA 57 (L) 11/21/2023 0359    INR No results found for: "INR"   Intake/Output Summary (Last 24 hours) at 11/21/2023 0857 Last data filed at 11/21/2023 0757 Gross per 24 hour  Intake 1438.59 ml  Output 1525 ml  Net -86.41 ml     Assessment/Plan:  68 y.o. male is s/p angiogram with L AK popliteal shockwave angioplasty and PT angioplasty 1 Day Post-Op   L foot well perfused with multiphasic PT signal by doppler R groin without hematoma Continue aspirin, plavix, and statin Plans noted for OR with Podiatry today   Emilie Rutter, PA-C [facility] [phone] 11/21/2023 8:57 AM   I have seen and evaluated the patient. I agree with the PA note as documented above.  Postprocedure day 1 status post left PT angioplasty with shockwave lithotripsy of the above-knee popliteal artery.  Brisk multiphasic PT signal at the  ankle on the left.  Right groin looks good.  Aspirin Plavix statin.  Will need follow-up in 4 to 6 weeks with left leg arterial duplex and ABI and  I will send the office a message.  Cephus Shelling, MD [facility] [place] Office: [phone]

## 2023-11-21 NOTE — Plan of Care (Signed)
   Problem: Education: Goal: Knowledge of General Education information will improve Description: Including pain rating scale, medication(s)/side effects and non-pharmacologic comfort measures Outcome: Progressing   Problem: Clinical Measurements: Goal: Respiratory complications will improve Outcome: Progressing

## 2023-11-21 NOTE — Progress Notes (Signed)
 Orthopedic Tech Progress Note Patient Details:  Jeremiah Burns 1955-11-05 [id number]  Patient ID: Jeremiah Burns, male   DOB: [birth year], 68 y.o.   MRN: [id number] Pt. Already has POS, brought from home. Jeremiah Burns 11/21/2023, 3:08 PM

## 2023-11-21 NOTE — Progress Notes (Signed)
 PT Cancellation Note  Patient Details Name: Jeremiah Burns MRN: [id number] DOB: 01/19/1956   Cancelled Treatment:    PT attempted to see pt for evaluation however pt refuses mobility this morning prior to surgery. PT will follow up as time allows.   Arlyss Gandy 11/21/2023, 1:48 PM

## 2023-11-21 NOTE — Evaluation (Signed)
 Physical Therapy Evaluation Patient Details Name: Jeremiah Burns MRN: [id number] DOB: 1956/01/07 Today's Date: 11/21/2023  History of Present Illness  Pt is a 68 y.o male presented to ED 11/18/23 after podiatrist recommendation d/t diabetic ulcer on L foot. LLE angiography with L posterior tibial artery angioplasty and L above knee popliteal angioplasty on 4/3. Pt underwent I&D of L foot ulceration along with tibial sesamoidectomy. PMH; DM2 with neuropathy, PAD.  Clinical Impression  Pt presents to PT with deficits in functional mobility, gait, balance, strength, power. Pt requires frequent cueing during session to maintain weightbearing restrictions for L foot, initially applying pressure through the L foot when transferring and with initial ambulation, but quickly progressing to maintaining NWB for short distances in the room. PT has multiple barriers to a successful transition home, including no caregiver support and multiple stairs to enter his home. PT will follow to improve transfer and gait quality while initiating stair training. Due to lack of assistance the pt will benefit from short term inpatient PT services.        If plan is discharge home, recommend the following: A lot of help with walking and/or transfers;A lot of help with bathing/dressing/bathroom;Assistance with cooking/housework;Assist for transportation;Help with stairs or ramp for entrance   Can travel by private vehicle   Yes    Equipment Recommendations Wheelchair (measurements PT);Wheelchair cushion (measurements PT);BSC/3in1  Recommendations for Other Services       Functional Status Assessment Patient has had a recent decline in their functional status and demonstrates the ability to make significant improvements in function in a reasonable and predictable amount of time.     Precautions / Restrictions Precautions Precautions: Fall Recall of Precautions/Restrictions: Impaired Required Braces or Orthoses: Other  Brace Other Brace: post-op shoe on L foot Restrictions Weight Bearing Restrictions Per Provider Order: Yes LLE Weight Bearing Per Provider Order: Non weight bearing Other Position/Activity Restrictions: NWB in post-op shoe      Mobility  Bed Mobility Overal bed mobility: Needs Assistance Bed Mobility: Supine to Sit, Sit to Supine     Supine to sit: Supervision Sit to supine: Supervision        Transfers Overall transfer level: Needs assistance Equipment used: Rolling walker (2 wheels) Transfers: Sit to/from Stand Sit to Stand: Contact guard assist           General transfer comment: pt initially applying pressure through L foot despite education on NWB status. Later during session pt is able to stand again while maintaining NWB well    Ambulation/Gait Ambulation/Gait assistance: Contact guard assist Gait Distance (Feet): 20 Feet Assistive device: Rolling walker (2 wheels)   Gait velocity: reduced Gait velocity interpretation: <1.31 ft/sec, indicative of household ambulator   General Gait Details: step-to for first 2 steps followed by progression to hop-to gait with reinforcement of NWB status  Stairs            Wheelchair Mobility     Tilt Bed    Modified Rankin (Stroke Patients Only)       Balance Overall balance assessment: Needs assistance Sitting-balance support: No upper extremity supported Sitting balance-Leahy Scale: Good     Standing balance support: Bilateral upper extremity supported, Reliant on assistive device for balance Standing balance-Leahy Scale: Poor                               Pertinent Vitals/Pain Pain Assessment Pain Assessment: 0-10 Pain Score: 4  Pain Location: L  foot Pain Descriptors / Indicators: Sore Pain Intervention(s): Premedicated before session    Home Living Family/patient expects to be discharged to:: Private residence Living Arrangements: Alone Available Help at Discharge:  (no caregiver  support identified) Type of Home: House Home Access: Stairs to enter Entrance Stairs-Rails: Can reach both Entrance Stairs-Number of Steps: 3+2   Home Layout: One level Home Equipment: Cane - single point;Shower seat - built in;Grab bars - tub/shower;Hand held shower head      Prior Function Prior Level of Function : Independent/Modified Independent;Driving             Mobility Comments: ambulatory with [facility]       Extremity/Trunk Assessment   Upper Extremity Assessment Upper Extremity Assessment: Overall WFL for tasks assessed    Lower Extremity Assessment Lower Extremity Assessment: Overall WFL for tasks assessed    Cervical / Trunk Assessment Cervical / Trunk Assessment: Normal  Communication   Communication Communication: No apparent difficulties    Cognition Arousal: Alert Behavior During Therapy: WFL for tasks assessed/performed   PT - Cognitive impairments: Awareness                       PT - Cognition Comments: pt standing utilizing L foot after instruction on WB restrictions Following commands: Intact       Cueing Cueing Techniques: Verbal cues, Tactile cues, Visual cues     General Comments General comments (skin integrity, edema, etc.): VSS on RA, PT notes ~2 inch spot of bloody drainage on dressing at plantar surface of L foot    Exercises     Assessment/Plan    PT Assessment Patient needs continued PT services  PT Problem List Decreased activity tolerance;Decreased strength;Decreased balance;Decreased mobility;Decreased knowledge of use of DME;Decreased safety awareness;Decreased knowledge of precautions;Pain       PT Treatment Interventions DME instruction;Gait training;Stair training;Functional mobility training;Therapeutic activities;Therapeutic exercise;Balance training;Neuromuscular re-education;Patient/family education    PT Goals (Current goals can be found in the Care Plan section)  Acute Rehab PT Goals Patient Stated  Goal: to allow for L foot wound to heal PT Goal Formulation: With patient Time For Goal Achievement: 12/05/23 Potential to Achieve Goals: Fair    Frequency Min 3X/week     Co-evaluation               AM-PAC PT "6 Clicks" Mobility  Outcome Measure Help needed turning from your back to your side while in a flat bed without using bedrails?: A Little Help needed moving from lying on your back to sitting on the side of a flat bed without using bedrails?: A Little Help needed moving to and from a bed to a chair (including a wheelchair)?: A Little Help needed standing up from a chair using your arms (e.g., wheelchair or bedside chair)?: A Little Help needed to walk in hospital room?: A Lot Help needed climbing 3-5 steps with a railing? : Total 6 Click Score: 15    End of Session Equipment Utilized During Treatment: Gait belt Activity Tolerance: Patient tolerated treatment well Patient left: in bed;with bed alarm set;with call bell/phone within reach Nurse Communication: Mobility status PT Visit Diagnosis: Other abnormalities of gait and mobility (R26.89);Muscle weakness (generalized) (M62.81)    Time: 7253-6644 PT Time Calculation (min) (ACUTE ONLY): 30 min   Charges:   PT Evaluation $PT Eval Low Complexity: 1 Low   PT General Charges $$ ACUTE PT VISIT: 1 Visit         Arlyss Gandy,  PT, DPT Acute Rehabilitation Office [phone]   Arlyss Gandy 11/21/2023, 4:43 PM

## 2023-11-21 NOTE — Progress Notes (Signed)
 PHARMACY - PHYSICIAN COMMUNICATION CRITICAL VALUE ALERT - BLOOD CULTURE IDENTIFICATION (BCID)  Jeremiah Burns is an 68 y.o. male who presented to [facility] on 11/18/2023 with a chief complaint of diabetic foot infection/cellulitis.  Assessment:  1 of 4 bottles with GNR, nothing identified on BCID  Name of physician (or Provider) Contacted: Dr. Antionette Char  Current antibiotics: vancomycin and zosyn  Changes to prescribed antibiotics recommended:  Patient is on recommended antibiotics - No changes needed  Results for orders placed or performed during the hospital encounter of 11/18/23  Blood Culture ID Panel (Reflexed) (Collected: 11/19/2023 12:34 AM)  Result Value Ref Range   Enterococcus faecalis NOT DETECTED NOT DETECTED   Enterococcus Faecium NOT DETECTED NOT DETECTED   Listeria monocytogenes NOT DETECTED NOT DETECTED   Staphylococcus species NOT DETECTED NOT DETECTED   Staphylococcus aureus (BCID) NOT DETECTED NOT DETECTED   Staphylococcus epidermidis NOT DETECTED NOT DETECTED   Staphylococcus lugdunensis NOT DETECTED NOT DETECTED   Streptococcus species NOT DETECTED NOT DETECTED   Streptococcus agalactiae NOT DETECTED NOT DETECTED   Streptococcus pneumoniae NOT DETECTED NOT DETECTED   Streptococcus pyogenes NOT DETECTED NOT DETECTED   A.calcoaceticus-baumannii NOT DETECTED NOT DETECTED   Bacteroides fragilis NOT DETECTED NOT DETECTED   Enterobacterales NOT DETECTED NOT DETECTED   Enterobacter cloacae complex NOT DETECTED NOT DETECTED   Escherichia coli NOT DETECTED NOT DETECTED   Klebsiella aerogenes NOT DETECTED NOT DETECTED   Klebsiella oxytoca NOT DETECTED NOT DETECTED   Klebsiella pneumoniae NOT DETECTED NOT DETECTED   Proteus species NOT DETECTED NOT DETECTED   Salmonella species NOT DETECTED NOT DETECTED   Serratia marcescens NOT DETECTED NOT DETECTED   Haemophilus influenzae NOT DETECTED NOT DETECTED   Neisseria meningitidis NOT DETECTED NOT DETECTED   Pseudomonas  aeruginosa NOT DETECTED NOT DETECTED   Stenotrophomonas maltophilia NOT DETECTED NOT DETECTED   Candida albicans NOT DETECTED NOT DETECTED   Candida auris NOT DETECTED NOT DETECTED   Candida glabrata NOT DETECTED NOT DETECTED   Candida krusei NOT DETECTED NOT DETECTED   Candida parapsilosis NOT DETECTED NOT DETECTED   Candida tropicalis NOT DETECTED NOT DETECTED   Cryptococcus neoformans/gattii NOT DETECTED NOT DETECTED    Loralee Pacas, PharmD, BCPS 11/21/2023  9:25 PM

## 2023-11-21 NOTE — Anesthesia Postprocedure Evaluation (Signed)
 Anesthesia Post Note  Patient: Jeremiah Burns  Procedure(s) Performed: EXCISION, METATARSAL BONE, HEAD (Left: Toe)     Patient location during evaluation: PACU Anesthesia Type: MAC Level of consciousness: awake and alert Pain management: pain level controlled Vital Signs Assessment: post-procedure vital signs reviewed and stable Respiratory status: spontaneous breathing, nonlabored ventilation, respiratory function stable and patient connected to nasal cannula oxygen Cardiovascular status: stable and blood pressure returned to baseline Postop Assessment: no apparent nausea or vomiting Anesthetic complications: no   There were no known notable events for this encounter.  Last Vitals:  Vitals:   11/21/23 1408 11/21/23 1417  BP: 123/62 118/88  Pulse: 63 66  Resp: 14 15  Temp:  (P) 36.4 C  SpO2: 97% 96%    Last Pain:  Vitals:   11/21/23 1417  TempSrc: (P) Oral  PainSc:                  Nelle Don Bronnie Vasseur

## 2023-11-21 NOTE — Op Note (Addendum)
 Full Operative Report  Date of Operation: 1:46 PM, 11/21/2023   Patient: Jeremiah Burns - 68 y.o. male  Surgeon: [facility], DPM   Assistant: None  Diagnosis: Infected bursitis plantar first MPJ, left foot; possible osteomyelitis, first MPJ left foot  Procedure:  1.  Irrigation and debridement of ulceration to bone level with prep for graft, left foot 2.  Tibial sesamoidectomy, left foot 3.  Bone biopsy via trocar open superficial proximal phalanx and first metatarsal head, left foot 4.  Application of dissolvable antibiotic beads, left foot 5.  Application Integra bilayer dermal allograft, 3 x 2.5 cm, left foot     Anesthesia: Monitor Anesthesia Care  No responsible provider has been recorded for the case.  Anesthesiologist: Atilano Median, DO CRNA: Berniece Pap, CRNA; Annabelle Harman A, CRNA   Estimated Blood Loss: 50 mL  Hemostasis: 1) Anatomical dissection, mechanical compression, electrocautery 2) no tourniquet was used during procedure  Implants: * No implants in log *  Materials: Skin staples  Injectables: 1) Pre-operatively: 20 cc of 50:50 mixture 1%lidocaine plain and 0.5% marcaine plain 2) Post-operatively: None   Specimens: Pathology: 1.  Tibial sesamoid.  2.  Bone biopsy proximal phalanx left hallux 3.  Bone biopsy first metatarsal head left.  Microbiology: Ulceration plantar first MPJ deep tissue culture   Antibiotics: IV antibiotics given per schedule on the floor  Drains: None  Complications: Patient tolerated the procedure well without complication.   Operative findings: As below in detailed report  Indications for Procedure: Deagan Allocca presents to [facility], [place] with a chief complaint of plantar first MPJ ulceration with significant pain underlying the first MPJ.  Concern for possible osteomyelitis underlying the first MPJ.  The patient has failed conservative treatments of various modalities. At this time  the patient has elected to proceed with surgical correction. All alternatives, risks, and complications of the procedures were thoroughly explained to the patient. Patient exhibits appropriate understanding of all discussion points and informed consent was signed and obtained in the chart with no guarantees to surgical outcome given or implied.  Description of Procedure: Patient was brought to the operating room. Patient remained on their hospital bed in the supine position. A surgical timeout was performed and all members of the operating room, the procedure, and the surgical site were identified. anesthesia occurred as per anesthesia record. Local anesthetic as previously described was then injected about the operative field in a local infiltrative block.  The operative lower extremity as noted above was then prepped and draped in the usual sterile manner. The following procedure then began.  Attention was directed to the plantar aspect of the first MPJ on the left foot.  There is noted to be small circular ulceration proximately 1 x 1 cm with fibropurulent material in the central aspect.  The wound was then excisionally debrided with 15 blade size to excise the entire ulceration and the underlying tissue.  The wound was excised and passed back and will send for culture.  Next further debridement was carried out around the ulceration with 15 blade and rongeur so as to excise any necrotic tissues present in the wound bed.  There is noted to be inflammation of the plantar first MPJ bursa which was excised.  This was sent with the tissue above for culture.  The wound did track towards the tibial sesamoid and first metatarsal head area.  Therefore given proximity of the ulceration probing towards the tibial sesamoid decision was made to resect  the tibial sesamoid.  The bone was excised with 15 blade and passed the back table sent for pathology.  Next using a Jamshidi needle a core of bone was harvested from  the plantar aspect first metatarsal head.  This was passed back table sent for pathology.  A separate core of bone was harvested from the proximal phalanx base and passed the back table sent to pathology.  The bone was noted to be firm and did not have any evidence obviously of osteomyelitis.  Next dissolvable calcium sulfate antibiotic beads mixed with vancomycin and tobramycin were mixed on the back table.  Small beads were made.  5 cc in total were then implanted in the surgical site and in the plantar aspect of the first metatarsal head neck area.  The wound postdebridement measures approximately 3 x 2.5 cm.  Then decision was made to place Integra bilayer dermal allograft overlying the ulceration.  The 5 x 5 cm graft was then placed overlying the wound and was secured in place with skin staples.  Approximately 2 cm of the graft was wasted.  A bolster dressing was then applied overlying the graft.  The surgical site was then dressed with Adaptic 4 x 4 Kerlix ABD pad Ace wrap. The patient tolerated both the procedure and anesthesia well with vital signs stable throughout. The patient was transferred in good condition and all vital signs stable  from the OR to recovery under the discretion of anesthesia.  Condition: Vital signs stable, neurovascular status unchanged from preoperative   Surgical plan:  Evidence of infected bursitis plantar aspect first MPJ.  This was excised.  Multiple bone biopsies taken as well as the tibial sesamoid was excised due to proximity to the ulceration concern for osteomyelitis.  Antibiotic beads and Integra graft applied.  No further surgical plans.  Nonweightbearing in postop shoe.  High risk for needing further surgery if the wound fails to heal  The patient will be nonweightbearing in a postop shoe to the operative limb until further instructed. The dressing is to remain clean, dry, and intact. Will continue to follow unless noted elsewhere.   Carlena Hurl,  DPM [facility]

## 2023-11-21 NOTE — Transfer of Care (Signed)
 Immediate Anesthesia Transfer of Care Note  Patient: Jeremiah Burns  Procedure(s) Performed: EXCISION, METATARSAL BONE, HEAD (Left: Toe)  Patient Location: PACU  Anesthesia Type:MAC  Level of Consciousness: drowsy  Airway & Oxygen Therapy: Patient Spontanous Breathing and Patient connected to face mask oxygen  Post-op Assessment: Report given to RN and Post -op Vital signs reviewed and stable  Post vital signs: Reviewed and stable  Last Vitals:  Vitals Value Taken Time  BP 112/61 11/21/23 1308  Temp    Pulse 67 11/21/23 1311  Resp 19 11/21/23 1311  SpO2 99 % 11/21/23 1311  Vitals shown include unfiled device data.  Last Pain:  Vitals:   11/21/23 0922  TempSrc:   PainSc: Asleep         Complications: There were no known notable events for this encounter.

## 2023-11-21 NOTE — Anesthesia Preprocedure Evaluation (Signed)
 Anesthesia Evaluation  Patient identified by MRN, date of birth, ID band Patient awake    Reviewed: Allergy & Precautions, NPO status , Patient's Chart, lab work & pertinent test results  Airway Mallampati: II  TM Distance: >3 FB Neck ROM: Full    Dental  (+) Poor Dentition   Pulmonary neg pulmonary ROS   Pulmonary exam normal        Cardiovascular + Peripheral Vascular Disease   Rhythm:Regular Rate:Normal     Neuro/Psych negative neurological ROS  negative psych ROS   GI/Hepatic negative GI ROS, Neg liver ROS,,,  Endo/Other  diabetes, Poorly Controlled, Type 2, Insulin Dependent    Renal/GU   negative genitourinary   Musculoskeletal negative musculoskeletal ROS (+)    Abdominal Normal abdominal exam  (+)   Peds  Hematology Lab Results      Component                Value               Date                      WBC                      6.6                 11/21/2023                HGB                      12.6 (L)            11/21/2023                HCT                      37.0 (L)            11/21/2023                MCV                      84.1                11/21/2023                PLT                      230                 11/21/2023              Anesthesia Other Findings   Reproductive/Obstetrics                             Anesthesia Physical Anesthesia Plan  ASA: 3  Anesthesia Plan: MAC   Post-op Pain Management:    Induction: Intravenous  PONV Risk Score and Plan: 1 and Ondansetron, Dexamethasone, Propofol infusion and Treatment may vary due to age or medical condition  Airway Management Planned: Simple Face Mask and Nasal Cannula  Additional Equipment: None  Intra-op Plan:   Post-operative Plan:   Informed Consent: I have reviewed the patients History and Physical, chart, labs and discussed the procedure including the risks, benefits and alternatives for the  proposed anesthesia with the patient or authorized representative who has indicated his/her  understanding and acceptance.     Dental advisory given  Plan Discussed with: CRNA  Anesthesia Plan Comments:        Anesthesia Quick Evaluation

## 2023-11-21 NOTE — Progress Notes (Addendum)
 PROGRESS NOTE    Darrin Koman  ZOX:[id number] DOB: [birth year] DOA: 11/18/2023 PCP: System, Provider Not In    Brief Narrative:   Lemoine Goyne is a 68 y.o. male with past medical history significant for type 2 diabetes mellitus with peripheral neuropathy, PAD, chronic ulceration left plantar first MPJ who was seen by podiatry in [place] on 4/1 and advised to go to the ED for further evaluation management for concern of diabetic foot infection versus osteomyelitis.  Patient was seen by vascular surgery and underwent angioplasty on 4/3.  MRI of foot negative for osteomyelitis but podiatry concerned and patient underwent I&D, tibial sesamoidectomy, bone biopsy with placement of Integra allograft and antibiotic beads by Dr. Annamary Rummage on 4/4.  Assessment & Plan:    Diabetic foot infection/cellulitis Patient presenting by discretion of outside podiatrist for concern of diabetic foot infection with possible underlying osteomyelitis.  MR left foot with and without contrast 4/2 with advanced degenerative arthropathy first MTP joint, unable to exclude septic arthritis, no definite osteomyelitis, plantar ulceration forefoot overlying first MTP joint with findings concerning for cellulitis without abscess. -- Blood cultures x 2/2: No growth x 2 days -- Operative culture 4/4: Rare GPC's in pairs -- Bone biopsy: Pending -- Vancomycin, pharmacy consulted for dosing/monitoring -- Zosyn 3.375 g IV every 8 hours -- Nonweightbearing per podiatry in postoperative shoe -- CBC in am  Left lower extremity critical limb ischemia PAD/PVD Seen by vascular surgery underwent angioplasty on 4/3. -- Aspirin 81 mg p.o. daily -- Plavix 75 mg p.o. daily -- Started on atorvastatin 40 mg p.o. daily  Type 2 diabetes mellitus with peripheral neuropathy, with hyperglycemia Hemoglobin A1c 8.9 on 4/2, poorly controlled.  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  Presenting with a  creatinine of 1.45. -- Cr 1.45>1.60>1.42>1.37 -- Avoid hepatotoxins, renal dose all medications -- BMP in am  Hyperlipidemia -- Started on atorvastatin 40 mg p.o. daily  Hyponatremia: Improving Etiology likely secondary to hypovolemic hyponatremia in the setting of dehydration as well as hyperglycemia. -- Na 130>>134 -- NS at 100 mL/h -- BMP in am  DVT prophylaxis: enoxaparin (LOVENOX) injection 40 mg Start: 11/22/23 1000 enoxaparin (LOVENOX) injection 40 mg Start: 11/19/23 1500 SCDs Start: 11/19/23 0316    Code Status: Full Code Family Communication: No family present at bedside this morning  Disposition Plan:  Level of care: Progressive Cardiac Status is: Inpatient Remains inpatient appropriate because: IV antibiotics    Consultants:  Vascular surgery Podiatry  Procedures:  LLE angiogram s/p left posterior tibial artery/popliteal artery angioplasty, Dr. Chestine Spore 4/3 I&D left foot with tibial sesamoidectomy, bone biopsy and placement of antibiotic beads Integra allograft, Dr. Annamary Rummage 4/4   Antimicrobials:  Vancomycin 4/1>> Zosyn 4/1>>   Subjective: Patient seen examined bedside, lying in bed.  Awaiting to go down for operative management regarding his diabetic foot infection.  No family present.  No complaints or concerns or questions this morning.  Denies headache, no dizziness, no chest pain, no palpitations, no shortness of breath, no abdominal pain, no fever/chills/night sweats, no nausea/vomiting/diarrhea, no focal weakness, no fatigue, no paresthesias.  No acute events overnight per nurse staff.  Objective: Vitals:   11/21/23 1445 11/21/23 1500 11/21/23 1525 11/21/23 1545  BP: 122/63 136/77    Pulse: 70 74 79 77  Resp: 20 17 17 17   Temp:      TempSrc:      SpO2: 98% 97% 98% 98%  Weight:      Height:  Intake/Output Summary (Last 24 hours) at 11/21/2023 1607 Last data filed at 11/21/2023 1558 Gross per 24 hour  Intake 2317.51 ml  Output 1500 ml   Net 817.51 ml   Filed Weights   11/18/23 1816 11/21/23 0500 11/21/23 1040  Weight: 81.6 kg 94.4 kg 94.4 kg    Examination:  Physical Exam: GEN: NAD, alert and oriented x 3, elderly/chronically ill appearance, appears older than stated age HEENT: NCAT, PERRL, EOMI, sclera clear, MMM PULM: CTAB w/o wheezes/crackles, normal respiratory effort, on room air CV: RRR w/o M/G/R GI: abd soft, NTND, NABS, no R/G/M MSK: no peripheral edema, left foot with Kerlix dressing in place, clean/dry/intact NEURO: CN II-XII intact, no focal deficits, sensation to light touch intact PSYCH: normal mood/affect Integumentary: Left lower extremity as above, otherwise no other concerning rashes/lesions/wounds noted on exposed skin surfaces     Data Reviewed: I have personally reviewed following labs and imaging studies  CBC: Recent Labs  Lab 11/18/23 1823 11/19/23 0428 11/21/23 0359  WBC 8.7 8.9 6.6  NEUTROABS 5.9  --   --   HGB 14.6 13.9 12.6*  HCT 43.6 42.5 37.0*  MCV 85.2 87.8 84.1  PLT 270 247 230   Basic Metabolic Panel: Recent Labs  Lab 11/18/23 1823 11/19/23 0428 11/20/23 0713 11/21/23 0359  NA 130* 132* 133* 134*  K 5.1 4.7 4.0 4.1  CL 98 104 105 106  CO2 22 19* 22 19*  GLUCOSE 217* 131* 132* 187*  BUN 47* 50* 33* 26*  CREATININE 1.45* 1.60* 1.42* 1.37*  CALCIUM 9.4 9.0 8.3* 8.1*   GFR: Estimated Creatinine Clearance: 61.4 mL/min (A) (by C-G formula based on SCr of 1.37 mg/dL (H)). Liver Function Tests: Recent Labs  Lab 11/18/23 1823  AST 18  ALT 17  ALKPHOS 39  BILITOT 0.3  PROT 7.0  ALBUMIN 3.6   No results for input(s): "LIPASE", "AMYLASE" in the last 168 hours. No results for input(s): "AMMONIA" in the last 168 hours. Coagulation Profile: No results for input(s): "INR", "PROTIME" in the last 168 hours. Cardiac Enzymes: No results for input(s): "CKTOTAL", "CKMB", "CKMBINDEX", "TROPONINI" in the last 168 hours. BNP (last 3 results) No results for input(s):  "PROBNP" in the last 8760 hours. HbA1C: Recent Labs    11/19/23 1209  HGBA1C 8.9*   CBG: Recent Labs  Lab 11/20/23 1159 11/20/23 2309 11/21/23 0710 11/21/23 1021 11/21/23 1310  GLUCAP 105* 173* 183* 162* 166*   Lipid Profile: Recent Labs    11/20/23 0713  CHOL 159  HDL 33*  LDLCALC 90  TRIG 161*  CHOLHDL 4.8   Thyroid Function Tests: No results for input(s): "TSH", "T4TOTAL", "FREET4", "T3FREE", "THYROIDAB" in the last 72 hours. Anemia Panel: No results for input(s): "VITAMINB12", "FOLATE", "FERRITIN", "TIBC", "IRON", "RETICCTPCT" in the last 72 hours. Sepsis Labs: Recent Labs  Lab 11/18/23 1840 11/19/23 0013  LATICACIDVEN 0.9 0.7    Recent Results (from the past 240 hours)  Blood culture (routine x 2)     Status: None (Preliminary result)   Collection Time: 11/19/23 12:34 AM   Specimen: BLOOD RIGHT ARM  Result Value Ref Range Status   Specimen Description BLOOD RIGHT ARM  Final   Special Requests   Final    BOTTLES DRAWN AEROBIC AND ANAEROBIC Blood Culture adequate volume   Culture   Final    NO GROWTH 2 DAYS Performed at [facility], [address] [place], [place] [address]    Report Status PENDING  Incomplete  Blood  culture (routine x 2)     Status: None (Preliminary result)   Collection Time: 11/19/23 12:34 AM   Specimen: BLOOD LEFT ARM  Result Value Ref Range Status   Specimen Description BLOOD LEFT ARM  Final   Special Requests   Final    BOTTLES DRAWN AEROBIC AND ANAEROBIC Blood Culture adequate volume   Culture   Final    NO GROWTH 2 DAYS Performed at [facility], [address], [place], [place] [address]    Report Status PENDING  Incomplete  Surgical pcr screen     Status: None   Collection Time: 11/21/23  6:27 AM   Specimen: Nasal Mucosa; Nasal Swab  Result Value Ref Range Status   MRSA, PCR NEGATIVE NEGATIVE Final   Staphylococcus aureus NEGATIVE NEGATIVE Final    Comment: (NOTE) The Xpert SA Assay (FDA approved for  NASAL specimens in patients 7 years of age and older), is one component of a comprehensive surveillance program. It is not intended to diagnose infection nor to guide or monitor treatment. Performed at [facility], [address], [place], [place] [address]   Aerobic/Anaerobic Culture w Gram Stain (surgical/deep wound)     Status: None (Preliminary result)   Collection Time: 11/21/23 12:33 PM   Specimen: Path Tissue  Result Value Ref Range Status   Specimen Description TISSUE  Final   Special Requests left foot ulcer  Final   Gram Stain   Final    NO WBC SEEN RARE GRAM POSITIVE COCCI IN PAIRS Performed at [facility], [address] [place], [place] [address]    Culture PENDING  Incomplete   Report Status PENDING  Incomplete         Radiology Studies: DG Foot 2 Views Left Result Date: 11/21/2023 CLINICAL DATA:  Postop. EXAM: LEFT FOOT - 2 VIEW COMPARISON:  Preoperative imaging FINDINGS: Presumed antibiotic beads project over the distal first metatarsal and metatarsal-phalangeal joint with overlying skin staples in place. Irregularity of the first metatarsal phalangeal joint is similar in appearance to preoperative radiograph. No evidence of acute fracture. IMPRESSION: Presumed antibiotic beads project over the distal first metatarsal and metatarsal-phalangeal joint. Electronically Signed   By: Narda Rutherford M.D.   On: 11/21/2023 16:03   PERIPHERAL VASCULAR CATHETERIZATION Result Date: 11/20/2023 Images from the original result were not included.   Patient name: Arth Nicastro         MRN: [id number]        DOB: Jan 04, 1956          Sex: male  11/20/2023 Pre-operative Diagnosis: Critical limb ischemia of left lower extremity with tissue loss Post-operative diagnosis:  Same Surgeon:  Cephus Shelling, MD Procedure Performed: 1.  Ultrasound-guided access right common femoral artery 2.  Aortogram with catheter selection of aorta 3.  Left lower extremity angiogram with  catheter selection of the left Popliteal artery and TP trunk 4.  Left posterior tibial artery angioplasty (3 mm x 60 mm Sterling) 5.  Left above-knee popliteal artery shockwave angioplasty (5 mm x 80 mm shockwave x 200 pulses) 6.  Mynx closure of the right common femoral artery 7.  51 minutes of monitored moderate conscious sedation time  Indications: Patient is a 68 year old male seen with tissue loss in his left lower extremity.  Vascular surgery was consulted for evaluation of inflow for wound healing.  He presents for lower extremity arteriogram after risks-benefit discussed.  Findings:  Ultrasound-guided access right common femoral artery.  Aortogram  showed patent infrarenal aorta with patent renals and patent SMA.  Both iliacs were patent without flow-limiting stenosis.   On the left he had a patent common femoral, profunda, and SFA without flow-limiting stenosis.  The above-knee popliteal artery had a focal 70% calcified stenosis.  Dominant tibial runoff was in the peroneal that was patent and gave a large collateral at the ankle to the DP.  The anterior tibial is patent proximally but then occludes in the distal calf.  The posterior tibial had a proximal 99% focal stenosis with the more distal vessel filling retrograde.  Ultimately I got down the left posterior tibial artery antegrade and this was treated with a 3 mm x 60 mm Sterling to nominal pressure for 2 minutes.  Much more brisk filling of the PT antegrade with now two-vessel runoff in the peroneal and posterior tibial.  The above-knee popliteal stenosis was treated with shockwave lithotripsy x 200 pulses using a 5 mm shockwave balloon.             Procedure:  The patient was identified in the holding area and taken to room 8.  The patient was then placed supine on the table and prepped and draped in the usual sterile fashion.  A time out was called.  The patient received Versed and fentanyl for conscious moderate sedation.  Vital signs were monitored  including heart rate, respiratory rate, oxygenation and blood pressure.  I was present for all of moderate sedation.  Ultrasound was used to evaluate the right common femoral artery.  It was patent .  A digital ultrasound image was acquired.  A micropuncture needle was used to access the right common femoral artery under ultrasound guidance.  An 018 wire was advanced without resistance and a micropuncture sheath was placed.  The 018 wire was removed and a benson wire was placed.  The micropuncture sheath was exchanged for a 5 french sheath.  An omniflush catheter was advanced over the wire to the level of L-1.  An abdominal angiogram was obtained.  Next, using the omniflush catheter and a benson wire, the aortic bifurcation was crossed and the catheter was placed into theleft external iliac artery and left runoff was obtained.  Ultimately elected for intervention.  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  A Glidewire advantage was used with the Omni Flush catheter down the left SFA and upsized to a long 5 [place] catapult sheath in the right groin over the aortic bifurcation.  The patient was given 100 units/kg IV heparin.  I then went down the left SFA antegrade and got a catheter and wire into the popliteal artery and TP trunk with hand injection.  I was able to get down the posterior tibial antegrade through the high-grade stenosis with a V18 wire.  The proximal PT was treated with a 3 mm x 60 Miller Sterling for 2 minutes.  Much better flow down the PT.  I then went to the above-knee popliteal artery and this was treated with shockwave lithotripsy after changing for Spartacore wire with a 6 mm x 80 mm shockwave lithotripsy balloon initially inflated to 2 atm and I did a total of 80 pulses.  I then inflated to 4 atm and did 120 pulses for 200 pulses total.  Optimized with excellent runoff with preserved runoff at completion.  Short 5 French sheath was placed in the right groin with a minx closure  device.  Plan: Excellent results.  Optimized from vascular surgery standpoint.  Inline flow with two-vessel runoff.  Aspirin Plavix statin.   Cephus Shelling, MD [facility] Office: [phone]        Scheduled Meds:  (feeding supplement) PROSource Plus  30 mL Oral BID BM   ascorbic acid  500 mg Oral Daily   aspirin EC  81 mg Oral Daily   atorvastatin  40 mg Oral Daily   clopidogrel  75 mg Oral Q breakfast   [START ON 11/22/2023] enoxaparin (LOVENOX) injection  40 mg Subcutaneous Daily   feeding supplement  237 mL Oral BID BM   insulin aspart  0-9 Units Subcutaneous TID WC   multivitamin with minerals  1 tablet Oral Daily   nutrition supplement (JUVEN)  1 packet Oral BID BM   pantoprazole (PROTONIX) IV  40 mg Intravenous Q24H   sodium chloride flush  3 mL Intravenous Q12H   zinc sulfate (50mg  elemental zinc)  220 mg Oral Daily   Continuous Infusions:  sodium chloride 100 mL/hr at 11/21/23 1515   sodium chloride     piperacillin-tazobactam (ZOSYN)  IV 3.375 g (11/21/23 1558)   vancomycin 1,500 mg (11/21/23 0011)     LOS: 2 days    Time spent: 48 minutes spent on 11/21/2023 caring for this patient face-to-face including chart review, ordering labs/tests, documenting, discussion with nursing staff, consultants, updating family and interview/physical exam    Alvira Philips [place], DO [facility] Available via Epic secure chat 7am-7pm After these hours, please refer to coverage provider listed on [email or web address] 11/21/2023, 4:07 PM

## 2023-11-21 NOTE — Progress Notes (Signed)
 PHARMACIST LIPID MONITORING   Jeremiah [place] is a 68 y.o. male admitted on 11/18/2023 with LLE critical limb ischemia with tissue loss s/p angiogram, shockwave angioplasty.  Pharmacy has been consulted to optimize lipid-lowering therapy with the indication of secondary prevention for clinical ASCVD.  Recent Labs:  Lipid Panel (last 6 months):   Lab Results  Component Value Date   CHOL 159 11/20/2023   TRIG 180 (H) 11/20/2023   HDL 33 (L) 11/20/2023   CHOLHDL 4.8 11/20/2023   VLDL 36 11/20/2023   LDLCALC 90 11/20/2023    Hepatic function panel (last 6 months):   Lab Results  Component Value Date   AST 18 11/18/2023   ALT 17 11/18/2023   ALKPHOS 39 11/18/2023   BILITOT 0.3 11/18/2023    SCr (since admission):   Serum creatinine: 1.37 mg/dL (H) 14/78/29 5621 Estimated creatinine clearance: 61.4 mL/min (A)  Current therapy and lipid therapy tolerance Current lipid-lowering therapy: none Previous lipid-lowering therapies (if applicable): none Documented or reported allergies or intolerances to lipid-lowering therapies (if applicable): none  Assessment:   Spoke with patient, no hx of cholesterol lowering therapy. Patient agrees with changes to lipid-lowering therapy  Plan:    1.Statin intensity (high intensity recommended for all patients regardless of the LDL):  Add or increase statin to high intensity. Atorvastatin 40mg  started by team.   2.Add ezetimibe (if any one of the following):   Not indicated at this time.  3.Refer to lipid clinic:   No  4.Follow-up with:  Primary care provider - System, Provider Not In  5.Follow-up labs after discharge:  Changes in lipid therapy were made. Check a lipid panel in 8-12 weeks then annually.      Alphia Moh, PharmD, BCPS, BCCP Clinical Pharmacist  Please check AMION for all [facility] phone numbers After 10:00 PM, call Main Pharmacy [phone]

## 2023-11-21 NOTE — Progress Notes (Signed)
 Took over patient care at 03:00 got report from [facility]

## 2023-11-21 NOTE — Progress Notes (Signed)
 History and Physical Interval Note:  11/21/2023 11:58 AM  Jeremiah [place]  has presented today for surgery, with the diagnosis of ulcer of left foot to bone level, question of osteomyelitis/septic arthritis 1st met head.  The various methods of treatment have been discussed with the patient and family. After consideration of risks, benefits and other options for treatment, the patient has consented to   Procedure(s) with comments: EXCISION, METATARSAL BONE, HEAD (Left) - I&D, bone biopsy vs 1st met head resction, abx beads, graft application as a surgical intervention.  The patient's history has been reviewed, patient examined, no change in status, stable for surgery.  I have reviewed the patient's chart and labs.  Questions were answered to the patient's satisfaction.     Jenelle Mages Denym Rahimi

## 2023-11-21 NOTE — Anesthesia Procedure Notes (Signed)
 Procedure Name: MAC Date/Time: 11/21/2023 12:20 PM  Performed by: Waynard Edwards, CRNAPre-anesthesia Checklist: Patient identified, Emergency Drugs available, Suction available and Patient being monitored Patient Re-evaluated:Patient Re-evaluated prior to induction Oxygen Delivery Method: Simple face mask Induction Type: IV induction Placement Confirmation: positive ETCO2 Dental Injury: Teeth and Oropharynx as per pre-operative assessment

## 2023-11-22 ENCOUNTER — Encounter (HOSPITAL_COMMUNITY): Payer: Self-pay | Admitting: Podiatry

## 2023-11-22 DIAGNOSIS — L089 Local infection of the skin and subcutaneous tissue, unspecified: Secondary | ICD-10-CM | POA: Diagnosis not present

## 2023-11-22 DIAGNOSIS — E11628 Type 2 diabetes mellitus with other skin complications: Secondary | ICD-10-CM | POA: Diagnosis not present

## 2023-11-22 LAB — BASIC METABOLIC PANEL WITH GFR
Anion gap: 9 (ref 5–15)
BUN: 27 mg/dL — ABNORMAL HIGH (ref 8–23)
CO2: 19 mmol/L — ABNORMAL LOW (ref 22–32)
Calcium: 8 mg/dL — ABNORMAL LOW (ref 8.9–10.3)
Chloride: 106 mmol/L (ref 98–111)
Creatinine, Ser: 1.48 mg/dL — ABNORMAL HIGH (ref 0.61–1.24)
GFR, Estimated: 52 mL/min — ABNORMAL LOW (ref >60)
Glucose, Bld: 228 mg/dL — ABNORMAL HIGH (ref 70–99)
Potassium: 4.4 mmol/L (ref 3.5–5.1)
Sodium: 134 mmol/L — ABNORMAL LOW (ref 135–145)

## 2023-11-22 LAB — CBC
HCT: 35.2 % — ABNORMAL LOW (ref 39.0–52.0)
Hemoglobin: 11.9 g/dL — ABNORMAL LOW (ref 13.0–17.0)
MCH: 28.5 pg (ref 26.0–34.0)
MCHC: 33.8 g/dL (ref 30.0–36.0)
MCV: 84.2 fL (ref 80.0–100.0)
Platelets: 221 10*3/uL (ref 150–400)
RBC: 4.18 MIL/uL — ABNORMAL LOW (ref 4.22–5.81)
RDW: 13 % (ref 11.5–15.5)
WBC: 8.3 10*3/uL (ref 4.0–10.5)
nRBC: 0 % (ref 0.0–0.2)

## 2023-11-22 LAB — GLUCOSE, CAPILLARY: Glucose-Capillary: 206 mg/dL — ABNORMAL HIGH (ref 70–99)

## 2023-11-22 MED ORDER — INSULIN GLARGINE-YFGN 100 UNIT/ML ~~LOC~~ SOLN
10.0000 [IU] | Freq: Two times a day (BID) | SUBCUTANEOUS | Status: DC
  Administered 2023-11-22 – 2023-11-28 (×12): 10 [IU] via SUBCUTANEOUS
  Filled 2023-11-22 (×14): qty 0.1

## 2023-11-22 MED ORDER — INSULIN ASPART 100 UNIT/ML IJ SOLN
0.0000 [IU] | Freq: Every day | INTRAMUSCULAR | Status: DC
  Administered 2023-11-22: 2 [IU] via SUBCUTANEOUS

## 2023-11-22 MED ORDER — INSULIN ASPART 100 UNIT/ML IJ SOLN
0.0000 [IU] | Freq: Three times a day (TID) | INTRAMUSCULAR | Status: DC
  Administered 2023-11-22: 3 [IU] via SUBCUTANEOUS
  Administered 2023-11-23: 2 [IU] via SUBCUTANEOUS
  Administered 2023-11-23: 5 [IU] via SUBCUTANEOUS
  Administered 2023-11-24 – 2023-11-25 (×4): 2 [IU] via SUBCUTANEOUS
  Administered 2023-11-26: 1 [IU] via SUBCUTANEOUS
  Administered 2023-11-26 – 2023-11-27 (×3): 3 [IU] via SUBCUTANEOUS
  Administered 2023-11-27 – 2023-11-28 (×3): 2 [IU] via SUBCUTANEOUS
  Administered 2023-11-28: 1 [IU] via SUBCUTANEOUS

## 2023-11-22 MED ORDER — PANTOPRAZOLE SODIUM 40 MG PO TBEC
40.0000 mg | DELAYED_RELEASE_TABLET | Freq: Every day | ORAL | Status: DC
  Administered 2023-11-23 – 2023-11-28 (×6): 40 mg via ORAL
  Filled 2023-11-22 (×6): qty 1

## 2023-11-22 MED ORDER — OXYCODONE HCL 5 MG PO TABS
5.0000 mg | ORAL_TABLET | ORAL | Status: DC | PRN
  Administered 2023-11-22 – 2023-11-28 (×15): 5 mg via ORAL
  Filled 2023-11-22 (×15): qty 1

## 2023-11-22 NOTE — Progress Notes (Addendum)
 Physical Therapy Treatment Patient Details Name: Jeremiah Burns MRN: [id number] DOB: February 20, 1956 Today's Date: 11/22/2023   History of Present Illness Pt is a 68 y.o male presented to ED 11/18/23 after podiatrist recommendation d/t diabetic ulcer on L foot. LLE angiography with L posterior tibial artery angioplasty and L above knee popliteal angioplasty on 4/3. Pt underwent I&D of L foot ulceration along with tibial sesamoidectomy. PMH; DM2 with neuropathy, PAD.    PT Comments  Pt in bed upon arrival and agreeable to PT session. Pt required assist to don/doff L post-op shoe and had difficulty maintaining LLE WB precautions with sit<>stand transfer. From an elevated bed height, pt required MinA with multiple verbal and visual cues to keep LLE in extension when standing. Pt was only able to ambulate ~33ft with CGA and RW before reporting being fatigued and needing to return to the bed. PT brought portable step into the room to practice stair negotiation with pt declining, stating that it would be too difficult. At this time, pt is requiring assist for all mobility and would be unable to enter his home as he has multiple steps and no caregiver support. Pt would benefit from <3hrs post acute rehab to work towards independence with mobility. TOC to follow-up as pt strongly wants to return home and may decline further rehab. Pt is progressing slowly towards goals. Acute PT to follow.      If plan is discharge home, recommend the following: A lot of help with walking and/or transfers;A lot of help with bathing/dressing/bathroom;Assistance with cooking/housework;Assist for transportation;Help with stairs or ramp for entrance   Can travel by private vehicle     Yes  Equipment Recommendations  Wheelchair (measurements PT);Wheelchair cushion (measurements PT);BSC/3in1;Rolling walker (2 wheels)       Precautions / Restrictions Precautions Precautions: Fall Recall of Precautions/Restrictions: Impaired Other  Brace: post-op shoe on L foot Restrictions Weight Bearing Restrictions Per Provider Order: Yes LLE Weight Bearing Per Provider Order: Non weight bearing Other Position/Activity Restrictions: NWB in post-op shoe     Mobility  Bed Mobility Overal bed mobility: Needs Assistance Bed Mobility: Supine to Sit, Sit to Supine    Supine to sit: Supervision, HOB elevated Sit to supine: Supervision, HOB elevated       Transfers Overall transfer level: Needs assistance Equipment used: Rolling walker (2 wheels) Transfers: Sit to/from Stand Sit to Stand: Min assist, Via lift equipment    General transfer comment: pt unable to stand without elevated bed height and MinA for boost up. Pt was unable to stand without applying pressure on L foot despite education on WB status    Ambulation/Gait Ambulation/Gait assistance: Contact guard assist Gait Distance (Feet): 8 Feet Assistive device: Rolling walker (2 wheels) Gait Pattern/deviations:  (hop to) Gait velocity: reduced     General Gait Details: unsteady with utilizing hop-to gait pattern, pt with increased fatigue and requested to return to the bed. Attempted to use step in room to practice stair negotiation with pt declining, stating he could not do it     Balance Overall balance assessment: Needs assistance, Mild deficits observed, not formally tested Sitting-balance support: No upper extremity supported, Feet supported Sitting balance-Leahy Scale: Fair     Standing balance support: Bilateral upper extremity supported, Reliant on assistive device for balance Standing balance-Leahy Scale: Poor Standing balance comment: reliant on RW and external support     Communication Communication Communication: No apparent difficulties  Cognition Arousal: Alert Behavior During Therapy: WFL for tasks assessed/performed   PT -  Cognitive impairments: Safety/Judgement, Problem solving    PT - Cognition Comments: pt unable to maintain WB  precautions when moving from sit/stand, decreased awareness into deficits and safety Following commands: Intact      Cueing Cueing Techniques: Verbal cues, Tactile cues, Visual cues  Exercises General Exercises - Lower Extremity Long Arc Quad: AROM, Both, Supine, 5 reps Hip ABduction/ADduction: AROM, 5 reps, Supine, Left (with hold in SLR) Straight Leg Raises: AROM, Both, 5 reps, Supine (x3 sec hold)    General Comments General comments (skin integrity, edema, etc.): At beginning of session, dressing dry and intact on L foot. At end of session, noted frank blood on plantar surface of bandages. RN notifed      Pertinent Vitals/Pain Pain Assessment Pain Assessment: Faces Faces Pain Scale: Hurts a little bit Pain Location: L foot Pain Descriptors / Indicators: Sore Pain Intervention(s): Limited activity within patient's tolerance, Monitored during session, Repositioned, Premedicated before session     PT Goals (current goals can now be found in the care plan section) Acute Rehab PT Goals PT Goal Formulation: With patient Time For Goal Achievement: 12/05/23 Potential to Achieve Goals: Fair Progress towards PT goals: Progressing toward goals    Frequency    Min 2X/week       AM-PAC PT "6 Clicks" Mobility   Outcome Measure  Help needed turning from your back to your side while in a flat bed without using bedrails?: A Little Help needed moving from lying on your back to sitting on the side of a flat bed without using bedrails?: A Little Help needed moving to and from a bed to a chair (including a wheelchair)?: A Little Help needed standing up from a chair using your arms (e.g., wheelchair or bedside chair)?: A Little Help needed to walk in hospital room?: A Lot Help needed climbing 3-5 steps with a railing? : Total 6 Click Score: 15    End of Session   Activity Tolerance: Patient limited by fatigue Patient left: in bed;with bed alarm set;with call bell/phone within  reach Nurse Communication: Mobility status;Other (comment) (blood on plantar surface of L foot) PT Visit Diagnosis: Other abnormalities of gait and mobility (R26.89);Muscle weakness (generalized) (M62.81)     Time: 7846-9629 PT Time Calculation (min) (ACUTE ONLY): 26 min  Charges:    $Gait Training: 8-22 mins $Therapeutic Exercise: 8-22 mins PT General Charges $$ ACUTE PT VISIT: 1 Visit                     Jeremiah Burns, PT, DPT Secure Chat Preferred  Rehab Office [phone]   Jeremiah Burns Brion Aliment 11/22/2023, 10:45 AM

## 2023-11-22 NOTE — Progress Notes (Signed)
   PODIATRY PROGRESS NOTE  NAME Jeremiah Burns MRN [id number] DOB June 19, 1956 DOA 11/18/2023   Reason for consult:  Chief Complaint  Patient presents with   Wound Infection     History of present illness: 68 y.o. male POD #1 s/p I&D, tibial sesamoidectomy, bone biopsy with graft application with Dr. Annamary Rummage.  Currently denies any fevers or chills.  States the nurses just changed the dressing.  Vitals:   11/22/23 0738 11/22/23 1000  BP: (!) 107/42 (!) 153/77  Pulse: 77   Resp: 17 20  Temp: 98.1 F (36.7 C)   SpO2: 95%        Latest Ref Rng & Units 11/22/2023    3:25 AM 11/21/2023    3:59 AM 11/19/2023    4:28 AM  CBC  WBC 4.0 - 10.5 K/uL 8.3  6.6  8.9   Hemoglobin 13.0 - 17.0 g/dL 44.0  10.2  72.5   Hematocrit 39.0 - 52.0 % 35.2  37.0  42.5   Platelets 150 - 400 K/uL 221  230  247        Latest Ref Rng & Units 11/22/2023    3:25 AM 11/21/2023    3:59 AM 11/20/2023    7:13 AM  BMP  Glucose 70 - 99 mg/dL 366  440  347   BUN 8 - 23 mg/dL 27  26  33   Creatinine 0.61 - 1.24 mg/dL 4.25  9.56  3.87   Sodium 135 - 145 mmol/L 134  134  133   Potassium 3.5 - 5.1 mmol/L 4.4  4.1  4.0   Chloride 98 - 111 mmol/L 106  106  105   CO2 22 - 32 mmol/L 19  19  22    Calcium 8.9 - 10.3 mg/dL 8.0  8.1  8.3       Physical Exam: General: AAO x 3, NAD  Dermatology: Dressing clean, dry, intact without any strikethrough at this time.  Vascular: Immediate cap refill to the toes.  Musculoskeletal Exam: Dressing intact.  He does endorse pain to the foot but has not worsened.    ASSESSMENT/PLAN OF CARE POD #1  - Plan to leave the dressing intact given the graft application however if there is further strikethrough can reinforce that change the bandage. - Continue nonweightbearing - Continue current antibiotics. - Morphine and tylenol ordered for pain control - Tissue culture 11/21/23- Rare GBC - Pathology 11/21/23- pending - Plan: Continue current antibiotic and await pathology, culture  results.  If abnormal will likely need long-term antibiotics and/or further surgery. - Podiatry will continue to follow.       Please contact me directly with any questions or concerns.     Ovid Curd, DPM [facility]  Dr. Lesia Sago. Josearmando Kuhnert, DPM    [address] [place], [place] [address]                Office [phone]  Fax [phone]

## 2023-11-22 NOTE — Plan of Care (Signed)
  Problem: Nutritional: Goal: Maintenance of adequate nutrition will improve Outcome: Progressing   Problem: Activity: Goal: Risk for activity intolerance will decrease Outcome: Progressing   

## 2023-11-22 NOTE — Plan of Care (Signed)
  Problem: Education: Goal: Ability to describe self-care measures that may prevent or decrease complications (Diabetes Survival Skills Education) will improve Outcome: Progressing   Problem: Education: Goal: Knowledge of General Education information will improve Description: Including pain rating scale, medication(s)/side effects and non-pharmacologic comfort measures Outcome: Progressing   Problem: Clinical Measurements: Goal: Respiratory complications will improve Outcome: Progressing Goal: Cardiovascular complication will be avoided Outcome: Progressing   Problem: Health Behavior/Discharge Planning: Goal: Ability to identify and utilize available resources and services will improve Outcome: Not Progressing   Problem: Skin Integrity: Goal: Risk for impaired skin integrity will decrease Outcome: Not Progressing   Problem: Pain Managment: Goal: General experience of comfort will improve and/or be controlled Outcome: Not Progressing   Problem: Skin Integrity: Goal: Risk for impaired skin integrity will decrease Outcome: Not Progressing

## 2023-11-22 NOTE — Progress Notes (Addendum)
 Pharmacy Antibiotic Note  Jeremiah Burns is a 68 y.o. male admitted on 11/18/2023 with osteomyelitis. Referred from outpatient podiatry for possible acute infection. Pharmacy has been consulted for zosyn and vancomycin dosing.  POD #1 s/p I&D, tibial sesamoidectomy, bone biopsy with graft application with Dr. Annamary Rummage. Awaiting finalization of cultures to adjust antibiotics. Renal function stable.  4/4 tissue cx: GPC, reincubating 4/2 bcx: GNR in 1/4 bottles (BCID neg), reincubating  Plan: Continue Zosyn 3.375g q8h Continue Vancomycin 1500mg  q24h (eAUC 467, Scr 1.48) - Plan to obtain vancomycin levels after D5 dose F/u renal function, cultures, and length of therapy  Height: 5\' 11"  (180.3 cm) Weight: 94.4 kg (208 lb 1.6 oz) IBW/kg (Calculated) : 75.3  Temp (24hrs), Avg:98.4 F (36.9 C), Min:98.1 F (36.7 C), Max:98.7 F (37.1 C)  Recent Labs  Lab 11/18/23 1823 11/18/23 1840 11/19/23 0013 11/19/23 0428 11/20/23 0713 11/21/23 0359 11/22/23 0325  WBC 8.7  --   --  8.9  --  6.6 8.3  CREATININE 1.45*  --   --  1.60* 1.42* 1.37* 1.48*  LATICACIDVEN  --  0.9 0.7  --   --   --   --     Estimated Creatinine Clearance: 56.8 mL/min (A) (by C-G formula based on SCr of 1.48 mg/dL (H)).    No Known Allergies  Thank you for allowing pharmacy to be a part of this patient's care.  Jenita Seashore 11/22/2023 2:29 PM

## 2023-11-22 NOTE — TOC Initial Note (Signed)
 Transition of Care [facility]) - Initial/Assessment Note    Patient Details  Name: Jeremiah Burns MRN: [id number] Date of Birth: [birth year]  [facility] CM/SW Contact:    Nicanor Bake Phone Number: [phone] 11/22/2023, 10:39 AM  Clinical Narrative:   CSW met with the patient at the bedside. Patient stated that he lives alone. Patient stated that he has has history of HH services from a year ago but could not remember agency name. Patient stated that he uses a cane. Patient stated that he doe snot have a PCP. CSW explained that a PCP appointment will be scheduled closer towards dc to establish care Patient has a scale at home.   TOC will continue following.                 Expected Discharge Plan: Home w Home Health Services Barriers to Discharge: Continued Medical Work up   Patient Goals and CMS Choice            Expected Discharge Plan and Services       Living arrangements for the past 2 months: Mobile Home                                      Prior Living Arrangements/Services Living arrangements for the past 2 months: Mobile Home Lives with:: Self Patient language and need for interpreter reviewed:: Yes Do you feel safe going back to the place where you live?: Yes      Need for Family Participation in Patient Care: Yes (Comment) Care giver support system in place?: No (comment)   Criminal Activity/Legal Involvement Pertinent to Current Situation/Hospitalization: No - Comment as needed  Activities of Daily Living   ADL Screening (condition at time of admission) Independently performs ADLs?: Yes (appropriate for developmental age) Is the patient deaf or have difficulty hearing?: No Does the patient have difficulty seeing, even when wearing glasses/contacts?: No Does the patient have difficulty concentrating, remembering, or making decisions?: No  Permission Sought/Granted                  Emotional Assessment Appearance::  Appears stated age Attitude/Demeanor/Rapport: Engaged Affect (typically observed): Appropriate   Alcohol / Substance Use: Never Used, Tobacco Use, Alcohol Use, Illicit Drugs Psych Involvement: No (comment)  Admission diagnosis:  Diabetic foot infection (HCC) [Y69.485, L08.9] Other osteomyelitis of left foot (HCC) [M86.8X7] Patient Active Problem List   Diagnosis Date Noted   Bursitis of left foot 11/21/2023   Diabetic foot infection (HCC) 11/19/2023   Hyponatremia 11/19/2023   AKI (acute kidney injury) (HCC) 11/19/2023   Insulin dependent type 2 diabetes mellitus (HCC) 11/19/2023   Pyogenic inflammation of bone (HCC) 11/19/2023   PCP:  System, Provider Not In Pharmacy:   [facility] [address] [place] - [address] [facility] [place] [address] Phone: [phone] Fax: [phone]     Social Drivers of Health (SDOH) Social History: SDOH Screenings   Food Insecurity: No Food Insecurity (11/19/2023)  Housing: Low Risk  (11/19/2023)  Transportation Needs: No Transportation Needs (11/19/2023)  Utilities: At Risk (11/19/2023)  Financial Resource Strain: Not at Risk (08/07/2023)   Received [facility]  Physical Activity: At Risk (08/07/2023)   Received from [facility]  Social Connections: Moderately Integrated (11/19/2023)  Stress: Not at Risk (08/07/2023)   [facility] from [facility]  Tobacco Use: Low Risk  (11/21/2023)   SDOH Interventions:  Utilities Interventions: Inpatient TOC   Readmission Risk Interventions     No data to display

## 2023-11-22 NOTE — Progress Notes (Signed)
 PROGRESS NOTE    Jeremiah Burns  VHQ:[id number] DOB: June 30, 1956 DOA: 11/18/2023 PCP: System, Provider Not In    Brief Narrative:   Jeremiah Burns is a 68 y.o. male with past medical history significant for type 2 diabetes mellitus with peripheral neuropathy, PAD, chronic ulceration left plantar first MPJ who was seen by podiatry in [place] on 4/1 and advised to go to the ED for further evaluation management for concern of diabetic foot infection versus osteomyelitis.  Patient was seen by vascular surgery and underwent angioplasty on 4/3.  MRI of foot negative for osteomyelitis but podiatry concerned and patient underwent I&D, tibial sesamoidectomy, bone biopsy with placement of Integra allograft and antibiotic beads by Dr. Annamary Rummage on 4/4.  Assessment & Plan:    Diabetic foot infection/cellulitis/bacteremia Patient presenting by discretion of outside podiatrist for concern of diabetic foot infection with possible underlying osteomyelitis.  MR left foot with and without contrast 4/2 with advanced degenerative arthropathy first MTP joint, unable to exclude septic arthritis, no definite osteomyelitis, plantar ulceration forefoot overlying first MTP joint with findings concerning for cellulitis without abscess. -- Blood cultures x 2/2: 1 of 4 bottles showed gram-negative rods, preliminary results.  Pharmacy aware. -- Operative culture 4/4: Rare GPC's in pairs-no further results available -- Bone biopsy: Pending Continue empirically started IV vancomycin and Zosyn.  Pending final culture sensitivity results, to determine choice and duration of antibiotics. -- Nonweightbearing per podiatry in postoperative shoe Ordered surveillance blood cultures.  Left lower extremity critical limb ischemia PAD/PVD Seen by vascular surgery underwent angioplasty on 4/3. -- Aspirin 81 mg p.o. daily -- Plavix 75 mg p.o. daily -- Started on atorvastatin 40 mg p.o. daily  Type 2 diabetes mellitus with  peripheral neuropathy, with hyperglycemia Hemoglobin A1c 8.9 on 4/2, poorly controlled. As communicated with pharmacy, initiating low-dose Semglee 10 units twice daily.  Was on higher dose Lantus PTA.  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  Presenting with a creatinine of 1.45. Creatinine has been labile in the hospital. Avoid nephrotoxins and monitor.  Hyperlipidemia -- Started on atorvastatin 40 mg p.o. daily  Hyponatremia: Resolved when corrected for hyperglycemia  DVT prophylaxis: enoxaparin (LOVENOX) injection 40 mg Start: 11/22/23 1000 SCDs Start: 11/19/23 0316    Code Status: Full Code Family Communication: No family present at bedside this morning  Disposition Plan:  Level of care: Med-Surg Status is: Inpatient Remains inpatient appropriate because: IV antibiotics    Consultants:  Vascular surgery Podiatry  Procedures:  LLE angiogram s/p left posterior tibial artery/popliteal artery angioplasty, Dr. Chestine Spore 4/3 I&D left foot with tibial sesamoidectomy, bone biopsy and placement of antibiotic beads Integra allograft, Dr. Annamary Rummage 4/4   Antimicrobials:  Vancomycin 4/1>> Zosyn 4/1>>   Subjective: When seen this morning, patient reported that his postop dressing was soaked with drainage.  Alerted RN.  Subsequently RN advised that current pain medications were not controlling his pain.  Added oxycodone.  Objective: Vitals:   11/22/23 0738 11/22/23 1000 11/22/23 1439 11/22/23 1444  BP: (!) 107/42 (!) 153/77 (!) 164/93 139/77  Pulse: 77  82   Resp: 17 20 18    Temp: 98.1 F (36.7 C)   97.8 F (36.6 C)  TempSrc: Oral   Oral  SpO2: 95%     Weight:      Height:        Intake/Output Summary (Last 24 hours) at 11/22/2023 1543 Last data filed at 11/22/2023 1400 Gross per 24 hour  Intake 2070.9 ml  Output  2050 ml  Net 20.9 ml   Filed Weights   11/18/23 1816 11/21/23 0500 11/21/23 1040  Weight: 81.6 kg 94.4 kg 94.4 kg     Examination: General exam: Middle-age male, looks older than stated age, moderately built and nourished lying comfortably supine in bed without distress.  Oral mucosa moist Respiratory system: Clear to auscultation. Respiratory effort normal. Cardiovascular system: S1 & S2 heard, RRR. No JVD, murmurs, rubs, gallops or clicks. No pedal edema.  Telemetry personally reviewed: Sinus rhythm. Gastrointestinal system: Abdomen is nondistended, soft and nontender. No organomegaly or masses felt. Normal bowel sounds heard. Central nervous system: Alert and oriented. No focal neurological deficits. Extremities: Symmetric 5 x 5 power. Skin: Left foot post dressing with mild soiling from drainage. Psychiatry: Judgement and insight appear normal. Mood & affect appropriate.      Data Reviewed: I have personally reviewed following labs and imaging studies  CBC: Recent Labs  Lab 11/18/23 1823 11/19/23 0428 11/21/23 0359 11/22/23 0325  WBC 8.7 8.9 6.6 8.3  NEUTROABS 5.9  --   --   --   HGB 14.6 13.9 12.6* 11.9*  HCT 43.6 42.5 37.0* 35.2*  MCV 85.2 87.8 84.1 84.2  PLT 270 247 230 221   Basic Metabolic Panel: Recent Labs  Lab 11/18/23 1823 11/19/23 0428 11/20/23 0713 11/21/23 0359 11/22/23 0325  NA 130* 132* 133* 134* 134*  K 5.1 4.7 4.0 4.1 4.4  CL 98 104 105 106 106  CO2 22 19* 22 19* 19*  GLUCOSE 217* 131* 132* 187* 228*  BUN 47* 50* 33* 26* 27*  CREATININE 1.45* 1.60* 1.42* 1.37* 1.48*  CALCIUM 9.4 9.0 8.3* 8.1* 8.0*   GFR: Estimated Creatinine Clearance: 56.8 mL/min (A) (by C-G formula based on SCr of 1.48 mg/dL (H)). Liver Function Tests: Recent Labs  Lab 11/18/23 1823  AST 18  ALT 17  ALKPHOS 39  BILITOT 0.3  PROT 7.0  ALBUMIN 3.6    CBG: Recent Labs  Lab 11/21/23 0710 11/21/23 1021 11/21/23 1310 11/21/23 1708 11/21/23 2057  GLUCAP 183* 162* 166* 184* 210*   Lipid Profile: Recent Labs    11/20/23 0713  CHOL 159  HDL 33*  LDLCALC 90  TRIG 161*   CHOLHDL 4.8     Recent Results (from the past 240 hours)  Blood culture (routine x 2)     Status: None (Preliminary result)   Collection Time: 11/19/23 12:34 AM   Specimen: BLOOD RIGHT ARM  Result Value Ref Range Status   Specimen Description BLOOD RIGHT ARM  Final   Special Requests   Final    BOTTLES DRAWN AEROBIC AND ANAEROBIC Blood Culture adequate volume   Culture  Setup Time   Final    GRAM NEGATIVE RODS AEROBIC BOTTLE ONLY CRITICAL RESULT CALLED TO, READ BACK BY AND VERIFIED WITH: PHARMD Talmadge Chad [id number] @2122  FH    Culture   Final    GRAM NEGATIVE RODS CULTURE REINCUBATED FOR BETTER GROWTH Performed at [facility], [address], [place], [place] [address]    Report Status PENDING  Incomplete  Blood culture (routine x 2)     Status: None (Preliminary result)   Collection Time: 11/19/23 12:34 AM   Specimen: BLOOD LEFT ARM  Result Value Ref Range Status   Specimen Description BLOOD LEFT ARM  Final   Special Requests   Final    BOTTLES DRAWN AEROBIC AND ANAEROBIC Blood Culture adequate volume   Culture   Final    NO  GROWTH 3 DAYS Performed at [facility], [address] [place], [place] [address]    Report Status PENDING  Incomplete  Blood Culture ID Panel (Reflexed)     Status: None   Collection Time: 11/19/23 12:34 AM  Result Value Ref Range Status   Enterococcus faecalis NOT DETECTED NOT DETECTED Final   Enterococcus Faecium NOT DETECTED NOT DETECTED Final   Listeria monocytogenes NOT DETECTED NOT DETECTED Final   Staphylococcus species NOT DETECTED NOT DETECTED Final   Staphylococcus aureus (BCID) NOT DETECTED NOT DETECTED Final   Staphylococcus epidermidis NOT DETECTED NOT DETECTED Final   Staphylococcus lugdunensis NOT DETECTED NOT DETECTED Final   Streptococcus species NOT DETECTED NOT DETECTED Final   Streptococcus agalactiae NOT DETECTED NOT DETECTED Final   Streptococcus pneumoniae NOT DETECTED NOT DETECTED Final   Streptococcus  pyogenes NOT DETECTED NOT DETECTED Final   A.calcoaceticus-baumannii NOT DETECTED NOT DETECTED Final   Bacteroides fragilis NOT DETECTED NOT DETECTED Final   Enterobacterales NOT DETECTED NOT DETECTED Final   Enterobacter cloacae complex NOT DETECTED NOT DETECTED Final   Escherichia coli NOT DETECTED NOT DETECTED Final   Klebsiella aerogenes NOT DETECTED NOT DETECTED Final   Klebsiella oxytoca NOT DETECTED NOT DETECTED Final   Klebsiella pneumoniae NOT DETECTED NOT DETECTED Final   Proteus species NOT DETECTED NOT DETECTED Final   Salmonella species NOT DETECTED NOT DETECTED Final   Serratia marcescens NOT DETECTED NOT DETECTED Final   Haemophilus influenzae NOT DETECTED NOT DETECTED Final   Neisseria meningitidis NOT DETECTED NOT DETECTED Final   Pseudomonas aeruginosa NOT DETECTED NOT DETECTED Final   Stenotrophomonas maltophilia NOT DETECTED NOT DETECTED Final   Candida albicans NOT DETECTED NOT DETECTED Final   Candida auris NOT DETECTED NOT DETECTED Final   Candida glabrata NOT DETECTED NOT DETECTED Final   Candida krusei NOT DETECTED NOT DETECTED Final   Candida parapsilosis NOT DETECTED NOT DETECTED Final   Candida tropicalis NOT DETECTED NOT DETECTED Final   Cryptococcus neoformans/gattii NOT DETECTED NOT DETECTED Final    Comment: Performed at [facility], [address] [place], [place] [address]  Surgical pcr screen     Status: None   Collection Time: 11/21/23  6:27 AM   Specimen: Nasal Mucosa; Nasal Swab  Result Value Ref Range Status   MRSA, PCR NEGATIVE NEGATIVE Final   Staphylococcus aureus NEGATIVE NEGATIVE Final    Comment: (NOTE) The Xpert SA Assay (FDA approved for NASAL specimens in patients 52 years of age and older), is one component of a comprehensive surveillance program. It is not intended to diagnose infection nor to guide or monitor treatment. Performed at [facility], [address], [place], [place] [address]   Aerobic/Anaerobic  Culture w Gram Stain (surgical/deep wound)     Status: None (Preliminary result)   Collection Time: 11/21/23 12:33 PM   Specimen: Path Tissue  Result Value Ref Range Status   Specimen Description TISSUE  Final   Special Requests left foot ulcer  Final   Gram Stain NO WBC SEEN RARE GRAM POSITIVE COCCI IN PAIRS   Final   Culture   Final    CULTURE REINCUBATED FOR BETTER GROWTH Performed at [facility], [address], [place], [place] [address]    Report Status PENDING  Incomplete         Radiology Studies: DG Foot 2 Views Left Result Date: 11/21/2023 CLINICAL DATA:  Postop. EXAM: LEFT FOOT - 2 VIEW COMPARISON:  Preoperative imaging  FINDINGS: Presumed antibiotic beads project over the distal first metatarsal and metatarsal-phalangeal joint with overlying skin staples in place. Irregularity of the first metatarsal phalangeal joint is similar in appearance to preoperative radiograph. No evidence of acute fracture. IMPRESSION: Presumed antibiotic beads project over the distal first metatarsal and metatarsal-phalangeal joint. Electronically Signed   By: Narda Rutherford M.D.   On: 11/21/2023 16:03   PERIPHERAL VASCULAR CATHETERIZATION Result Date: 11/20/2023 Images from the original result were not included.   Patient name: Herchel Hopkin         MRN: [id number]        DOB: [birth year]          Sex: male  11/20/2023 Pre-operative Diagnosis: Critical limb ischemia of left lower extremity with tissue loss Post-operative diagnosis:  Same Surgeon:  Cephus Shelling, MD Procedure Performed: 1.  Ultrasound-guided access right common femoral artery 2.  Aortogram with catheter selection of aorta 3.  Left lower extremity angiogram with catheter selection of the left Popliteal artery and TP trunk 4.  Left posterior tibial artery angioplasty (3 mm x 60 mm Sterling) 5.  Left above-knee popliteal artery shockwave angioplasty (5 mm x 80 mm shockwave x 200 pulses) 6.  Mynx closure of the right common  femoral artery 7.  51 minutes of monitored moderate conscious sedation time  Indications: Patient is a 68 year old male seen with tissue loss in his left lower extremity.  Vascular surgery was consulted for evaluation of inflow for wound healing.  He presents for lower extremity arteriogram after risks-benefit discussed.  Findings:  Ultrasound-guided access right common femoral artery.  Aortogram showed patent infrarenal aorta with patent renals and patent SMA.  Both iliacs were patent without flow-limiting stenosis.   On the left he had a patent common femoral, profunda, and SFA without flow-limiting stenosis.  The above-knee popliteal artery had a focal 70% calcified stenosis.  Dominant tibial runoff was in the peroneal that was patent and gave a large collateral at the ankle to the DP.  The anterior tibial is patent proximally but then occludes in the distal calf.  The posterior tibial had a proximal 99% focal stenosis with the more distal vessel filling retrograde.  Ultimately I got down the left posterior tibial artery antegrade and this was treated with a 3 mm x 60 mm Sterling to nominal pressure for 2 minutes.  Much more brisk filling of the PT antegrade with now two-vessel runoff in the peroneal and posterior tibial.  The above-knee popliteal stenosis was treated with shockwave lithotripsy x 200 pulses using a 5 mm shockwave balloon.             Procedure:  The patient was identified in the holding area and taken to room 8.  The patient was then placed supine on the table and prepped and draped in the usual sterile fashion.  A time out was called.  The patient received Versed and fentanyl for conscious moderate sedation.  Vital signs were monitored including heart rate, respiratory rate, oxygenation and blood pressure.  I was present for all of moderate sedation.  Ultrasound was used to evaluate the right common femoral artery.  It was patent .  A digital ultrasound image was acquired.  A micropuncture  needle was used to access the right common femoral artery under ultrasound guidance.  An 018 wire was advanced without resistance and a micropuncture sheath was placed.  The 018 wire was removed and a benson wire was placed.  The micropuncture sheath was exchanged  for a 5 french sheath.  An omniflush catheter was advanced over the wire to the level of L-1.  An abdominal angiogram was obtained.  Next, using the omniflush catheter and a benson wire, the aortic bifurcation was crossed and the catheter was placed into theleft external iliac artery and left runoff was obtained.  Ultimately elected for intervention.  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  A Glidewire advantage was used with the Omni Flush catheter down the left SFA and upsized to a long 5 [place] catapult sheath in the right groin over the aortic bifurcation.  The patient was given 100 units/kg IV heparin.  I then went down the left SFA antegrade and got a catheter and wire into the popliteal artery and TP trunk with hand injection.  I was able to get down the posterior tibial antegrade through the high-grade stenosis with a V18 wire.  The proximal PT was treated with a 3 mm x 60 Miller Sterling for 2 minutes.  Much better flow down the PT.  I then went to the above-knee popliteal artery and this was treated with shockwave lithotripsy after changing for Spartacore wire with a 6 mm x 80 mm shockwave lithotripsy balloon initially inflated to 2 atm and I did a total of 80 pulses.  I then inflated to 4 atm and did 120 pulses for 200 pulses total.  Optimized with excellent runoff with preserved runoff at completion.  Short 5 French sheath was placed in the right groin with a minx closure device.  Plan: Excellent results.  Optimized from vascular surgery standpoint.  Inline flow with two-vessel runoff.  Aspirin Plavix statin.   Cephus Shelling, MD [facility] Office: [phone]        Scheduled  Meds:  (feeding supplement) PROSource Plus  30 mL Oral BID BM   ascorbic acid  500 mg Oral Daily   aspirin EC  81 mg Oral Daily   atorvastatin  40 mg Oral Daily   clopidogrel  75 mg Oral Q breakfast   enoxaparin (LOVENOX) injection  40 mg Subcutaneous Daily   feeding supplement  237 mL Oral BID BM   insulin aspart  0-9 Units Subcutaneous TID WC   insulin glargine-yfgn  10 Units Subcutaneous BID   multivitamin with minerals  1 tablet Oral Daily   nutrition supplement (JUVEN)  1 packet Oral BID BM   [START ON 11/23/2023] pantoprazole  40 mg Oral Daily   sodium chloride flush  3 mL Intravenous Q12H   zinc sulfate (50mg  elemental zinc)  220 mg Oral Daily   Continuous Infusions:  sodium chloride 100 mL/hr at 11/22/23 0839   piperacillin-tazobactam (ZOSYN)  IV 3.375 g (11/22/23 0847)   vancomycin 1,500 mg (11/21/23 2351)     LOS: 3 days    Time spent: 48 minutes spent on 11/22/2023 caring for this patient face-to-face including chart review, ordering labs/tests, documenting, discussion with nursing staff, consultants, updating family and interview/physical exam    Marcellus Scott, DO [facility] Available via Epic secure chat 7am-7pm After these hours, please refer to coverage provider listed on [email or web address] 11/22/2023, 3:43 PM

## 2023-11-23 DIAGNOSIS — M868X7 Other osteomyelitis, ankle and foot: Secondary | ICD-10-CM

## 2023-11-23 LAB — GLUCOSE, CAPILLARY: Glucose-Capillary: 176 mg/dL — ABNORMAL HIGH (ref 70–99)

## 2023-11-23 LAB — BASIC METABOLIC PANEL WITH GFR
Anion gap: 8 (ref 5–15)
BUN: 24 mg/dL — ABNORMAL HIGH (ref 8–23)
CO2: 19 mmol/L — ABNORMAL LOW (ref 22–32)
Calcium: 8 mg/dL — ABNORMAL LOW (ref 8.9–10.3)
Chloride: 107 mmol/L (ref 98–111)
Creatinine, Ser: 1.5 mg/dL — ABNORMAL HIGH (ref 0.61–1.24)
GFR, Estimated: 51 mL/min — ABNORMAL LOW (ref >60)
Glucose, Bld: 181 mg/dL — ABNORMAL HIGH (ref 70–99)
Potassium: 4.4 mmol/L (ref 3.5–5.1)
Sodium: 134 mmol/L — ABNORMAL LOW (ref 135–145)

## 2023-11-23 MED ORDER — BISACODYL 5 MG PO TBEC: 5.0000 mg | DELAYED_RELEASE_TABLET | Freq: Every day | ORAL | Status: DC | PRN

## 2023-11-23 MED ORDER — SENNOSIDES-DOCUSATE SODIUM 8.6-50 MG PO TABS
1.0000 | ORAL_TABLET | Freq: Two times a day (BID) | ORAL | Status: DC
  Administered 2023-11-23 – 2023-11-28 (×11): 1 via ORAL
  Filled 2023-11-23 (×11): qty 1

## 2023-11-23 MED ORDER — POLYETHYLENE GLYCOL 3350 17 G PO PACK
17.0000 g | PACK | Freq: Every day | ORAL | Status: DC
  Administered 2023-11-23 – 2023-11-28 (×6): 17 g via ORAL
  Filled 2023-11-23 (×6): qty 1

## 2023-11-23 NOTE — Plan of Care (Signed)
  Problem: Health Behavior/Discharge Planning: Goal: Ability to manage health-related needs will improve Outcome: Progressing   Problem: Education: Goal: Knowledge of General Education information will improve Description: Including pain rating scale, medication(s)/side effects and non-pharmacologic comfort measures Outcome: Progressing   Problem: Clinical Measurements: Goal: Respiratory complications will improve Outcome: Progressing Goal: Cardiovascular complication will be avoided Outcome: Progressing   Problem: Elimination: Goal: Will not experience complications related to urinary retention Outcome: Progressing   Problem: Clinical Measurements: Goal: Will remain free from infection Outcome: Not Progressing   Problem: Activity: Goal: Risk for activity intolerance will decrease Outcome: Not Progressing   Problem: Pain Managment: Goal: General experience of comfort will improve and/or be controlled Outcome: Not Progressing

## 2023-11-23 NOTE — Plan of Care (Signed)
   Problem: Education: Goal: Knowledge of General Education information will improve Description: Including pain rating scale, medication(s)/side effects and non-pharmacologic comfort measures Outcome: Progressing   Problem: Clinical Measurements: Goal: Respiratory complications will improve Outcome: Progressing

## 2023-11-23 NOTE — Evaluation (Signed)
 Occupational Therapy Re-Evaluation Patient Details Name: Jeremiah Burns MRN: [id number] DOB: 1955-08-27 Today's Date: 11/23/2023   History of Present Illness   Pt is a 68 y.o male presented to ED 11/18/23 after podiatrist recommendation d/t diabetic ulcer on L foot. LLE angiography with L posterior tibial artery angioplasty and L above knee popliteal angioplasty on 4/3. Pt underwent I&D of L foot ulceration along with tibial sesamoidectomy. PMH; DM2 with neuropathy, PAD.     Clinical Impressions Pt re-evaluated given his new LLE NWB status, pt presents with limited insight into his deficits and poor compliance with WB precautions. Pt believes he can go home with assistance but pt not able to transfer safely to [facility] via pivot and maintain WB precautions, he is also unsure if his home is w/c accessible. Pt also needing Mod to setup A for ADLs as he is unable to do any dynamic standing ADL such as underwear management without keeping LLE toes from being supported on the ground. Pt reports he is aware that he is not allow to keep his LLE on the ground with mobility but continues to make efforts to support his balance with it. Pt would greatly benefit from placement at skilled rehab <3hrs of inpatient therapy before DC home. OT to continue efforts to progress pt as able, will look to see if pt can perform lateral scoots to/from but this will not matter if pt home is not w/c accessible.      If plan is discharge home, recommend the following:   A little help with walking and/or transfers;A little help with bathing/dressing/bathroom;Assistance with cooking/housework;Help with stairs or ramp for entrance     Functional Status Assessment   Patient has had a recent decline in their functional status and demonstrates the ability to make significant improvements in function in a reasonable and predictable amount of time.     Equipment Recommendations   BSC/3in1;Wheelchair (measurements OT);Wheelchair  cushion (measurements OT);Tub/shower seat     Recommendations for Other Services         Precautions/Restrictions   Precautions Precautions: Fall Recall of Precautions/Restrictions: Impaired Required Braces or Orthoses: Other Brace Other Brace: post-op shoe on L foot Restrictions Weight Bearing Restrictions Per Provider Order: Yes LLE Weight Bearing Per Provider Order: Non weight bearing Other Position/Activity Restrictions: NWB in post-op shoe     Mobility Bed Mobility Overal bed mobility: Needs Assistance Bed Mobility: Supine to Sit, Sit to Supine     Supine to sit: Supervision, HOB elevated, Used rails Sit to supine: Supervision, HOB elevated        Transfers Overall transfer level: Needs assistance Equipment used: Rolling walker (2 wheels) Transfers: Sit to/from Stand Sit to Stand: Contact guard assist Stand pivot transfers: Contact guard assist (poor ability to maintain precautions)         General transfer comment: STS was CGA, observed pt keep LLE off ground to maintain NWB precautions. Once he attempts dynamic mobility is when his ability to maintain WB status depreciates. Educated pt to use RLE to simply pivot along heel instead of attempting to initiate a step, even demonstrate this to pt before mobility. Pt not able to perform maneuver and performed small hop to [facility] successfully but proceeded to long pivot from [facility] to bed. Discussed with pt the safety concerns for such a maneuver.      Balance Overall balance assessment: Needs assistance, Mild deficits observed, not formally tested Sitting-balance support: No upper extremity supported, Feet supported Sitting balance-Leahy Scale: Fair  Standing balance support: Bilateral upper extremity supported, Reliant on assistive device for balance Standing balance-Leahy Scale: Poor Standing balance comment: reliant on RW and external support                           ADL either performed or  assessed with clinical judgement   ADL Overall ADL's : Needs assistance/impaired Eating/Feeding: Set up;Sitting   Grooming: Sitting;Set up   Upper Body Bathing: Sitting;Set up   Lower Body Bathing: Set up;Sitting/lateral leans   Upper Body Dressing : Set up;Sitting   Lower Body Dressing: Moderate assistance Lower Body Dressing Details (indicate cue type and reason): Pt able to complete sitting LBD with setup, needs assist to doff briefs while maintaining LLE NWB precautions Toilet Transfer: Contact guard assist;Stand-pivot;Rolling walker (2 wheels) Toilet Transfer Details (indicate cue type and reason): poor LLE NWB ability. Toileting- Clothing Manipulation and Hygiene: Maximal assistance;Sit to/from stand               Vision         Perception         Praxis         Pertinent Vitals/Pain Pain Assessment Pain Assessment: Faces Faces Pain Scale: Hurts a little bit Pain Location: L foot Pain Descriptors / Indicators: Sore Pain Intervention(s): Limited activity within patient's tolerance, Repositioned, Monitored during session     Extremity/Trunk Assessment Upper Extremity Assessment Upper Extremity Assessment: Overall WFL for tasks assessed   Lower Extremity Assessment Lower Extremity Assessment: LLE deficits/detail LLE Deficits / Details: LLE NWB       Communication Communication Communication: No apparent difficulties   Cognition Arousal: Alert Behavior During Therapy: WFL for tasks assessed/performed Cognition: No apparent impairments             OT - Cognition Comments: poor compliance with precautions, poor insight into deficits                 Following commands: Intact       Cueing  General Comments   Cueing Techniques: Verbal cues;Tactile cues;Visual cues  Pt LLE dressing assessed at end of session. dressing c/d/i   Exercises     Shoulder Instructions      Home Living Family/patient expects to be discharged to::  Private residence Living Arrangements: Alone Available Help at Discharge:  (no caregiver support identified) Type of Home: House Home Access: Stairs to enter [employer] of Steps: 3+2 Entrance Stairs-Rails: Can reach both Home Layout: One level     Bathroom Shower/Tub: Producer, television/film/video: Standard Bathroom Accessibility: Yes How Accessible: Accessible via walker Home Equipment: Cane - single point;Shower seat - built in;Grab bars - tub/shower;Hand held shower head          Prior Functioning/Environment Prior Level of Function : Independent/Modified Independent;Driving             Mobility Comments: ambulatory with SPC ADLs Comments: ind    OT Problem List: Decreased activity tolerance;Impaired balance (sitting and/or standing);Decreased safety awareness;Decreased knowledge of use of DME or AE;Decreased knowledge of precautions;Pain   OT Treatment/Interventions: Self-care/ADL training;Therapeutic exercise;DME and/or AE instruction;Therapeutic activities;Patient/family education;Balance training      OT Goals(Current goals can be found in the care plan section)   Acute Rehab OT Goals Patient Stated Goal: To go home; get help at home OT Goal Formulation: With patient Time For Goal Achievement: 12/07/23 Potential to Achieve Goals: Good ADL Goals Pt Will Transfer to Toilet: stand pivot  transfer;with supervision;bedside commode Pt Will Perform Toileting - Clothing Manipulation and hygiene: with supervision;sitting/lateral leans Pt Will Perform Tub/Shower Transfer: Shower transfer;shower seat;3 in 1 Additional ADL Goal #1: Pt will demonstrate compliance with NWB LLE precautions   OT Frequency:  Min 2X/week    Co-evaluation              AM-PAC OT "6 Clicks" Daily Activity     Outcome Measure Help from another person eating meals?: None Help from another person taking care of personal grooming?: A Little Help from another person  toileting, which includes using toliet, bedpan, or urinal?: A Little Help from another person bathing (including washing, rinsing, drying)?: A Little Help from another person to put on and taking off regular upper body clothing?: A Little Help from another person to put on and taking off regular lower body clothing?: A Lot 6 Click Score: 18   End of Session Equipment Utilized During Treatment: Gait belt;Rolling walker (2 wheels) Nurse Communication: Mobility status  Activity Tolerance: Other (comment) (poor compliance with WB precautions) Patient left: in bed;with call bell/phone within reach;with bed alarm set  OT Visit Diagnosis: Unsteadiness on feet (R26.81);Other abnormalities of gait and mobility (R26.89);Pain Pain - Right/Left: Left Pain - part of body: Ankle and joints of foot                Time: 1250-1326 OT Time Calculation (min): 36 min Charges:  OT General Charges $OT Visit: 1 Visit OT Evaluation $OT Re-eval: 1 Re-eval OT Treatments $Therapeutic Activity: 8-22 mins  11/23/2023  AB, OTR/L  Acute Rehabilitation Services  Office: [phone]   Tristan Schroeder 11/23/2023, 1:47 PM

## 2023-11-23 NOTE — Progress Notes (Signed)
 PROGRESS NOTE    Jeremiah Burns  NUU:[id number] DOB: [birth year] DOA: 11/18/2023 PCP: System, Provider Not In    Brief Narrative:   Jeremiah Burns is a 68 y.o. male with past medical history significant for type 2 diabetes mellitus with peripheral neuropathy, PAD, chronic ulceration left plantar first MPJ who was seen by podiatry in [place] on 4/1 and advised to go to the ED for further evaluation management for concern of diabetic foot infection versus osteomyelitis.  Patient was seen by vascular surgery and underwent angioplasty on 4/3.  MRI of foot negative for osteomyelitis but podiatry concerned and patient underwent I&D, tibial sesamoidectomy, bone biopsy with placement of Integra allograft and antibiotic beads by Dr. Annamary Rummage on 4/4.  Assessment & Plan:    Diabetic foot infection/cellulitis/bacteremia Patient presenting by discretion of outside podiatrist for concern of diabetic foot infection with possible underlying osteomyelitis.  MR left foot with and without contrast 4/2 with advanced degenerative arthropathy first MTP joint, unable to exclude septic arthritis, no definite osteomyelitis, plantar ulceration forefoot overlying first MTP joint with findings concerning for cellulitis without abscess. -- Blood cultures x 2/2: 1 of 4 bottles showed gram-negative rods, preliminary results.  Pharmacy aware. -- Operative culture 4/4: abundant Staphylococcus Simulans, Moderate Diphtheroids, sensitivities pending -- Bone biopsy/pathology: Pending Continue empirically started IV vancomycin and Zosyn.  Pending final culture sensitivity results, to determine choice and duration of antibiotics. -- Nonweightbearing per podiatry in postoperative shoe Ordered surveillance blood cultures 4/6. Dr. Ardelle Anton, Podiatry has seen today and changed to nonadherent dressing   Left lower extremity critical limb ischemia PAD/PVD Seen by vascular surgery underwent angioplasty on 4/3. -- Aspirin 81 mg  p.o. daily -- Plavix 75 mg p.o. daily -- Started on atorvastatin 40 mg p.o. daily  Type 2 diabetes mellitus with peripheral neuropathy, with hyperglycemia Hemoglobin A1c 8.9 on 4/2, poorly controlled. As communicated with pharmacy, initiating low-dose Semglee 10 units twice daily.  Was on higher dose Lantus PTA. Monitor closely and adjust insulins as needed.  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  Presenting with a creatinine of 1.45. Creatinine has been labile in the hospital but mostly in the 1.4 range, which may be his new stable baseline. Avoid nephrotoxins and monitor.  Hyperlipidemia -- Started on atorvastatin 40 mg p.o. daily  Hyponatremia: Resolved when corrected for hyperglycemia  DVT prophylaxis: enoxaparin (LOVENOX) injection 40 mg Start: 11/22/23 1000 SCDs Start: 11/19/23 0316    Code Status: Full Code Family Communication: No family present at bedside this morning  Disposition Plan:  Level of care: Med-Surg Status is: Inpatient Remains inpatient appropriate because: IV antibiotics    Consultants:  Vascular surgery Podiatry  Procedures:  LLE angiogram s/p left posterior tibial artery/popliteal artery angioplasty, Dr. Chestine Spore 4/3 I&D left foot with tibial sesamoidectomy, bone biopsy and placement of antibiotic beads Integra allograft, Dr. Annamary Rummage 4/4   Antimicrobials:  Vancomycin 4/1>> Zosyn 4/1>>   Subjective: When seen this morning, denied complaints.  Specifically denied pain.  Pain meds were adjusted yesterday.  Subsequently RN reported constipation and that patient did not want to keep on his telemetry.  Objective: Vitals:   11/23/23 0410 11/23/23 0726 11/23/23 0822 11/23/23 1145  BP: (!) 122/56  (!) 155/78 (!) 143/68  Pulse:    82  Resp: 18 15 20 18   Temp: 98.3 F (36.8 C)  98.1 F (36.7 C) 98.5 F (36.9 C)  TempSrc:   Oral Oral  SpO2: 96%  97% 98%  Weight:  Height:        Intake/Output Summary  (Last 24 hours) at 11/23/2023 1353 Last data filed at 11/23/2023 1211 Gross per 24 hour  Intake 1354.2 ml  Output 1820 ml  Net -465.8 ml   Filed Weights   11/18/23 1816 11/21/23 0500 11/21/23 1040  Weight: 81.6 kg 94.4 kg 94.4 kg    Examination: General exam: Middle-age male, looks older than stated age, moderately built and nourished lying comfortably supine in bed without distress.  Oral mucosa moist Respiratory system: Clear to auscultation.  No increased work of breathing. Cardiovascular system: S1 & S2 heard, RRR. No JVD, murmurs, rubs, gallops or clicks. No pedal edema.  Telemetry personally reviewed: Sinus rhythm, discontinued telemetry Gastrointestinal system: Abdomen is nondistended, soft and nontender. No organomegaly or masses felt. Normal bowel sounds heard. Central nervous system: Alert and oriented. No focal neurological deficits. Extremities: Symmetric 5 x 5 power. Skin: Left foot dressing clean, dry and intact. Psychiatry: Judgement and insight appear normal. Mood & affect appropriate.      Data Reviewed: I have personally reviewed following labs and imaging studies  CBC: Recent Labs  Lab 11/18/23 1823 11/19/23 0428 11/21/23 0359 11/22/23 0325  WBC 8.7 8.9 6.6 8.3  NEUTROABS 5.9  --   --   --   HGB 14.6 13.9 12.6* 11.9*  HCT 43.6 42.5 37.0* 35.2*  MCV 85.2 87.8 84.1 84.2  PLT 270 247 230 221   Basic Metabolic Panel: Recent Labs  Lab 11/19/23 0428 11/20/23 0713 11/21/23 0359 11/22/23 0325 11/23/23 0326  NA 132* 133* 134* 134* 134*  K 4.7 4.0 4.1 4.4 4.4  CL 104 105 106 106 107  CO2 19* 22 19* 19* 19*  GLUCOSE 131* 132* 187* 228* 181*  BUN 50* 33* 26* 27* 24*  CREATININE 1.60* 1.42* 1.37* 1.48* 1.50*  CALCIUM 9.0 8.3* 8.1* 8.0* 8.0*   GFR: Estimated Creatinine Clearance: 56 mL/min (A) (by C-G formula based on SCr of 1.5 mg/dL (H)). Liver Function Tests: Recent Labs  Lab 11/18/23 1823  AST 18  ALT 17  ALKPHOS 39  BILITOT 0.3  PROT 7.0   ALBUMIN 3.6    CBG: Recent Labs  Lab 11/21/23 1021 11/21/23 1310 11/21/23 1708 11/21/23 2057 11/22/23 1632  GLUCAP 162* 166* 184* 210* 206*   Lipid Profile: No results for input(s): "CHOL", "HDL", "LDLCALC", "TRIG", "CHOLHDL", "LDLDIRECT" in the last 72 hours.    Recent Results (from the past 240 hours)  Blood culture (routine x 2)     Status: None (Preliminary result)   Collection Time: 11/19/23 12:34 AM   Specimen: BLOOD RIGHT ARM  Result Value Ref Range Status   Specimen Description BLOOD RIGHT ARM  Final   Special Requests   Final    BOTTLES DRAWN AEROBIC AND ANAEROBIC Blood Culture adequate volume   Culture  Setup Time   Final    GRAM NEGATIVE RODS AEROBIC BOTTLE ONLY CRITICAL RESULT CALLED TO, READ BACK BY AND VERIFIED WITH: PHARMD Talmadge Chad [id number] @2122  FH    Culture   Final    GRAM NEGATIVE RODS CULTURE REINCUBATED FOR BETTER GROWTH Performed at [facility], [address], [place], [place] [address]    Report Status PENDING  Incomplete  Blood culture (routine x 2)     Status: None (Preliminary result)   Collection Time: 11/19/23 12:34 AM   Specimen: BLOOD LEFT ARM  Result Value Ref Range Status   Specimen Description BLOOD LEFT ARM  Final  Special Requests   Final    BOTTLES DRAWN AEROBIC AND ANAEROBIC Blood Culture adequate volume   Culture   Final    NO GROWTH 4 DAYS Performed at [facility], [address], [place], [place] [address]    Report Status PENDING  Incomplete  Blood Culture ID Panel (Reflexed)     Status: None   Collection Time: 11/19/23 12:34 AM  Result Value Ref Range Status   Enterococcus faecalis NOT DETECTED NOT DETECTED Final   Enterococcus Faecium NOT DETECTED NOT DETECTED Final   Listeria monocytogenes NOT DETECTED NOT DETECTED Final   Staphylococcus species NOT DETECTED NOT DETECTED Final   Staphylococcus aureus (BCID) NOT DETECTED NOT DETECTED Final   Staphylococcus epidermidis NOT DETECTED NOT DETECTED  Final   Staphylococcus lugdunensis NOT DETECTED NOT DETECTED Final   Streptococcus species NOT DETECTED NOT DETECTED Final   Streptococcus agalactiae NOT DETECTED NOT DETECTED Final   Streptococcus pneumoniae NOT DETECTED NOT DETECTED Final   Streptococcus pyogenes NOT DETECTED NOT DETECTED Final   A.calcoaceticus-baumannii NOT DETECTED NOT DETECTED Final   Bacteroides fragilis NOT DETECTED NOT DETECTED Final   Enterobacterales NOT DETECTED NOT DETECTED Final   Enterobacter cloacae complex NOT DETECTED NOT DETECTED Final   Escherichia coli NOT DETECTED NOT DETECTED Final   Klebsiella aerogenes NOT DETECTED NOT DETECTED Final   Klebsiella oxytoca NOT DETECTED NOT DETECTED Final   Klebsiella pneumoniae NOT DETECTED NOT DETECTED Final   Proteus species NOT DETECTED NOT DETECTED Final   Salmonella species NOT DETECTED NOT DETECTED Final   Serratia marcescens NOT DETECTED NOT DETECTED Final   Haemophilus influenzae NOT DETECTED NOT DETECTED Final   Neisseria meningitidis NOT DETECTED NOT DETECTED Final   Pseudomonas aeruginosa NOT DETECTED NOT DETECTED Final   Stenotrophomonas maltophilia NOT DETECTED NOT DETECTED Final   Candida albicans NOT DETECTED NOT DETECTED Final   Candida auris NOT DETECTED NOT DETECTED Final   Candida glabrata NOT DETECTED NOT DETECTED Final   Candida krusei NOT DETECTED NOT DETECTED Final   Candida parapsilosis NOT DETECTED NOT DETECTED Final   Candida tropicalis NOT DETECTED NOT DETECTED Final   Cryptococcus neoformans/gattii NOT DETECTED NOT DETECTED Final    Comment: Performed at [facility], [address], [place], [place] [address]  Surgical pcr screen     Status: None   Collection Time: 11/21/23  6:27 AM   Specimen: Nasal Mucosa; Nasal Swab  Result Value Ref Range Status   MRSA, PCR NEGATIVE NEGATIVE Final   Staphylococcus aureus NEGATIVE NEGATIVE Final    Comment: (NOTE) The Xpert SA Assay (FDA approved for NASAL specimens in patients 22 years  of age and older), is one component of a comprehensive surveillance program. It is not intended to diagnose infection nor to guide or monitor treatment. Performed at [facility], [address], [place], [place] [address]   Aerobic/Anaerobic Culture w Gram Stain (surgical/deep wound)     Status: None (Preliminary result)   Collection Time: 11/21/23 12:33 PM   Specimen: Path Tissue  Result Value Ref Range Status   Specimen Description TISSUE  Final   Special Requests left foot ulcer  Final   Gram Stain NO WBC SEEN RARE GRAM POSITIVE COCCI IN PAIRS   Final   Culture   Final    ABUNDANT STAPHYLOCOCCUS SIMULANS MODERATE DIPHTHEROIDS(CORYNEBACTERIUM SPECIES) Standardized susceptibility testing for this organism is not available. RARE GRAM NEGATIVE RODS SUSCEPTIBILITIES TO FOLLOW Performed at [facility], [address], [place], [place] [address]    Report Status PENDING  Incomplete         Radiology Studies: DG Foot 2 Views Left Result Date: 11/21/2023 CLINICAL DATA:  Postop. EXAM: LEFT FOOT - 2 VIEW COMPARISON:  Preoperative imaging FINDINGS: Presumed antibiotic beads project over the distal first metatarsal and metatarsal-phalangeal joint with overlying skin staples in place. Irregularity of the first metatarsal phalangeal joint is similar in appearance to preoperative radiograph. No evidence of acute fracture. IMPRESSION: Presumed antibiotic beads project over the distal first metatarsal and metatarsal-phalangeal joint. Electronically Signed   By: Narda Rutherford M.D.   On: 11/21/2023 16:03        Scheduled Meds:  (feeding supplement) PROSource Plus  30 mL Oral BID BM   ascorbic acid  500 mg Oral Daily   aspirin EC  81 mg Oral Daily   atorvastatin  40 mg Oral Daily   clopidogrel  75 mg Oral Q breakfast   enoxaparin (LOVENOX) injection  40 mg Subcutaneous Daily   feeding supplement  237 mL Oral BID BM   insulin aspart  0-5 Units Subcutaneous QHS   insulin  aspart  0-9 Units Subcutaneous TID WC   insulin glargine-yfgn  10 Units Subcutaneous BID   multivitamin with minerals  1 tablet Oral Daily   nutrition supplement (JUVEN)  1 packet Oral BID BM   pantoprazole  40 mg Oral Daily   polyethylene glycol  17 g Oral Daily   senna-docusate  1 tablet Oral BID   sodium chloride flush  3 mL Intravenous Q12H   zinc sulfate (50mg  elemental zinc)  220 mg Oral Daily   Continuous Infusions:  piperacillin-tazobactam (ZOSYN)  IV 3.375 g (11/23/23 0836)   vancomycin 1,500 mg (11/23/23 0048)     LOS: 4 days    Time spent: 48 minutes spent on 11/23/2023 caring for this patient face-to-face including chart review, ordering labs/tests, documenting, discussion with nursing staff, consultants, updating family and interview/physical exam    Marcellus Scott, DO [facility] Available via Epic secure chat 7am-7pm After these hours, please refer to coverage provider listed on [email or web address] 11/23/2023, 1:53 PM

## 2023-11-23 NOTE — Progress Notes (Signed)
 PODIATRY PROGRESS NOTE  NAME Jeremiah Burns MRN [id number] DOB 01-08-1956 DOA 11/18/2023   Reason for consult:  Chief Complaint  Patient presents with   Wound Infection    History of present illness: 68 y.o. male POD #2 s/p I&D, tibial sesamoidectomy, bone biopsy with graft application with Dr. Annamary Rummage.  Still having some pain.   Vitals:   11/23/23 0726 11/23/23 0822  BP:  (!) 155/78  Pulse:    Resp: 15 20  Temp:  98.1 F (36.7 C)  SpO2:  97%     CBC    Component Value Date/Time   WBC 8.3 11/22/2023 0325   RBC 4.18 (L) 11/22/2023 0325   HGB 11.9 (L) 11/22/2023 0325   HCT 35.2 (L) 11/22/2023 0325   PLT 221 11/22/2023 0325   MCV 84.2 11/22/2023 0325   MCH 28.5 11/22/2023 0325   MCHC 33.8 11/22/2023 0325   RDW 13.0 11/22/2023 0325   LYMPHSABS 1.5 11/18/2023 1823   MONOABS 1.0 11/18/2023 1823   EOSABS 0.2 11/18/2023 1823   BASOSABS 0.1 11/18/2023 1823         Latest Ref Rng & Units 11/23/2023    3:26 AM 11/22/2023    3:25 AM 11/21/2023    3:59 AM  BMP  Glucose 70 - 99 mg/dL 829  562  130   BUN 8 - 23 mg/dL 24  27  26    Creatinine 0.61 - 1.24 mg/dL 8.65  7.84  6.96   Sodium 135 - 145 mmol/L 134  134  134   Potassium 3.5 - 5.1 mmol/L 4.4  4.4  4.1   Chloride 98 - 111 mmol/L 107  106  106   CO2 22 - 32 mmol/L 19  19  19    Calcium 8.9 - 10.3 mg/dL 8.0  8.0  8.1        Physical Exam: General: AAO x 3, NAD- resting comfortably in bed.   Dermatology: Dressing clean, dry, intact without any strikethrough at this time.  Dressing changed today.  Graft intact submetatarsal 1.  There is no drainage or purulence noted.  There was some dried bloody drainage on the bandage.  There is no surrounding erythema, ascending cellulitis.  On the dorsal aspect of the hallux there is minimal erythema.  There is no fluctuation or crepitation.    Vascular: Immediate cap refill to the toes.  Musculoskeletal Exam: Dressing intact.  He does endorse pain to the foot but has not  worsened.    ASSESSMENT/PLAN OF CARE POD #2  - Dressing changed today.  Nonadherent dressing was applied to the wound followed by dry sterile dressing.  Keep the dressing clean, dry, intact for now. - Continue nonweightbearing - Continue current antibiotics. - Morphine and tylenol ordered for pain control - Tissue culture 11/21/23-  Rare GPC - Pathology 11/21/23- Pending - Plan: Continue current antibiotic and await pathology, culture results.  If abnormal will likely need long-term antibiotics and/or further surgery. - Podiatry will continue to follow.    Please contact me directly with any questions or concerns.     Ovid Curd, DPM [facility]  Dr. Lesia Sago. Keyanna Sandefer, DPM    [address], [place] [address]  Office [phone]  Fax [phone]

## 2023-11-24 DIAGNOSIS — E11628 Type 2 diabetes mellitus with other skin complications: Secondary | ICD-10-CM | POA: Diagnosis not present

## 2023-11-24 DIAGNOSIS — B964 Proteus (mirabilis) (morganii) as the cause of diseases classified elsewhere: Secondary | ICD-10-CM

## 2023-11-24 DIAGNOSIS — M71172 Other infective bursitis, left ankle and foot: Secondary | ICD-10-CM | POA: Diagnosis not present

## 2023-11-24 DIAGNOSIS — K5901 Slow transit constipation: Secondary | ICD-10-CM | POA: Diagnosis not present

## 2023-11-24 DIAGNOSIS — R7881 Bacteremia: Secondary | ICD-10-CM

## 2023-11-24 DIAGNOSIS — B958 Unspecified staphylococcus as the cause of diseases classified elsewhere: Secondary | ICD-10-CM

## 2023-11-24 DIAGNOSIS — L089 Local infection of the skin and subcutaneous tissue, unspecified: Secondary | ICD-10-CM | POA: Diagnosis not present

## 2023-11-24 LAB — CBC
HCT: 36.4 % — ABNORMAL LOW (ref 39.0–52.0)
Hemoglobin: 12.4 g/dL — ABNORMAL LOW (ref 13.0–17.0)
MCH: 28.5 pg (ref 26.0–34.0)
MCHC: 34.1 g/dL (ref 30.0–36.0)
MCV: 83.7 fL (ref 80.0–100.0)
Platelets: 230 10*3/uL (ref 150–400)
RBC: 4.35 MIL/uL (ref 4.22–5.81)
RDW: 13.1 % (ref 11.5–15.5)
WBC: 6.5 10*3/uL (ref 4.0–10.5)
nRBC: 0 % (ref 0.0–0.2)

## 2023-11-24 LAB — GLUCOSE, CAPILLARY
Glucose-Capillary: 156 mg/dL — ABNORMAL HIGH (ref 70–99)
Glucose-Capillary: 164 mg/dL — ABNORMAL HIGH (ref 70–99)
Glucose-Capillary: 242 mg/dL — ABNORMAL HIGH (ref 70–99)
Glucose-Capillary: 227 mg/dL — ABNORMAL HIGH (ref 70–99)
Glucose-Capillary: 296 mg/dL — ABNORMAL HIGH (ref 70–99)
Glucose-Capillary: 196 mg/dL — ABNORMAL HIGH (ref 70–99)
Glucose-Capillary: 183 mg/dL — ABNORMAL HIGH (ref 70–99)
Glucose-Capillary: 159 mg/dL — ABNORMAL HIGH (ref 70–99)
Glucose-Capillary: 212 mg/dL — ABNORMAL HIGH (ref 70–99)
Glucose-Capillary: 165 mg/dL — ABNORMAL HIGH (ref 70–99)
Glucose-Capillary: 156 mg/dL — ABNORMAL HIGH (ref 70–99)
Glucose-Capillary: 175 mg/dL — ABNORMAL HIGH (ref 70–99)

## 2023-11-24 LAB — CULTURE, BLOOD (ROUTINE X 2)
Culture: NO GROWTH
Special Requests: ADEQUATE

## 2023-11-24 LAB — BASIC METABOLIC PANEL WITH GFR
Anion gap: 8 (ref 5–15)
BUN: 21 mg/dL (ref 8–23)
CO2: 21 mmol/L — ABNORMAL LOW (ref 22–32)
Calcium: 8.5 mg/dL — ABNORMAL LOW (ref 8.9–10.3)
Chloride: 105 mmol/L (ref 98–111)
Creatinine, Ser: 1.49 mg/dL — ABNORMAL HIGH (ref 0.61–1.24)
GFR, Estimated: 51 mL/min — ABNORMAL LOW (ref >60)
Glucose, Bld: 180 mg/dL — ABNORMAL HIGH (ref 70–99)
Potassium: 4.1 mmol/L (ref 3.5–5.1)
Sodium: 134 mmol/L — ABNORMAL LOW (ref 135–145)

## 2023-11-24 LAB — SURGICAL PATHOLOGY

## 2023-11-24 LAB — VANCOMYCIN, PEAK: Vancomycin Pk: 36 ug/mL (ref 30–40)

## 2023-11-24 LAB — VANCOMYCIN, TROUGH: Vancomycin Tr: 14 ug/mL — ABNORMAL LOW (ref 15–20)

## 2023-11-24 MED ORDER — HYDRALAZINE HCL 20 MG/ML IJ SOLN
5.0000 mg | Freq: Four times a day (QID) | INTRAMUSCULAR | Status: DC | PRN
  Administered 2023-11-24: 5 mg via INTRAVENOUS
  Filled 2023-11-24: qty 1

## 2023-11-24 MED ORDER — BISACODYL 10 MG RE SUPP
10.0000 mg | Freq: Once | RECTAL | Status: AC
  Administered 2023-11-24: 10 mg via RECTAL
  Filled 2023-11-24: qty 1

## 2023-11-24 MED ORDER — QUETIAPINE FUMARATE 25 MG PO TABS
50.0000 mg | ORAL_TABLET | Freq: Every day | ORAL | Status: DC
  Administered 2023-11-24 – 2023-11-27 (×4): 50 mg via ORAL
  Filled 2023-11-24 (×4): qty 2

## 2023-11-24 MED ORDER — INSULIN ASPART 100 UNIT/ML IJ SOLN
3.0000 [IU] | Freq: Three times a day (TID) | INTRAMUSCULAR | Status: DC
  Administered 2023-11-24 – 2023-11-28 (×12): 3 [IU] via SUBCUTANEOUS

## 2023-11-24 NOTE — Plan of Care (Signed)
  Problem: Education: Goal: Knowledge of General Education information will improve Description: Including pain rating scale, medication(s)/side effects and non-pharmacologic comfort measures Outcome: Progressing   Problem: Clinical Measurements: Goal: Respiratory complications will improve Outcome: Progressing Goal: Cardiovascular complication will be avoided Outcome: Progressing   Problem: Health Behavior/Discharge Planning: Goal: Ability to manage health-related needs will improve Outcome: Not Progressing   Problem: Skin Integrity: Goal: Risk for impaired skin integrity will decrease Outcome: Not Progressing   Problem: Clinical Measurements: Goal: Will remain free from infection Outcome: Not Progressing   Problem: Activity: Goal: Risk for activity intolerance will decrease Outcome: Not Progressing   Problem: Pain Managment: Goal: General experience of comfort will improve and/or be controlled Outcome: Not Progressing

## 2023-11-24 NOTE — Progress Notes (Signed)
 Pharmacy Antibiotic Note  Jeremiah Burns is a 68 y.o. male admitted on 11/18/2023 with osteomyelitis and possible septic arthritis. Referred from outpatient podiatry for possible acute infection. Pharmacy has been consulted for zosyn and vancomycin dosing.  S/p I&D, tibial sesamoidectomy, bone biopsy with graft application with Dr. Annamary Rummage. Awaiting finalization of cultures to adjust antibiotics.  Renal function is relatively stable and vancomycin AUC is 545 (goal 400-600).  Afebrile, WBC WNL  Plan: Continue Zosyn EID 3.375g IV Q8H Continue vanc 1500mg  IV Q24H F/u renal function, cultures, and length of therapy  Height: 5\' 11"  (180.3 cm) Weight: 94.4 kg (208 lb 1.6 oz) IBW/kg (Calculated) : 75.3  Temp (24hrs), Avg:98.3 F (36.8 C), Min:98 F (36.7 C), Max:98.6 F (37 C)  Recent Labs  Lab 11/18/23 1823 11/18/23 1840 11/19/23 0013 11/19/23 0428 11/20/23 0713 11/21/23 0359 11/22/23 0325 11/23/23 0326 11/24/23 0347  WBC 8.7  --   --  8.9  --  6.6 8.3  --  6.5  CREATININE 1.45*  --   --  1.60* 1.42* 1.37* 1.48* 1.50* 1.49*  LATICACIDVEN  --  0.9 0.7  --   --   --   --   --   --   VANCOPEAK  --   --   --   --   --   --   --   --  36    Estimated Creatinine Clearance: 56.4 mL/min (A) (by C-G formula based on SCr of 1.49 mg/dL (H)).    No Known Allergies  Vanc 4/2 >> Zosyn 4/2 >>  4/7 VP/VT 36/14, AUC 545 on 1500mg  q24 >> no change   4/2 BCx - 1/4 Sphingomonas paucimobilis  4/4 tissue cx - Staph simulans, Corynebacterium, Proteus 4/4 MRSA neg   Solomia Harrell D. Laney Potash, PharmD, BCPS, BCCCP 11/24/2023, 10:00 PM

## 2023-11-24 NOTE — Consult Note (Signed)
 [facility]    Date of Admission:  11/18/2023     Total days of antibiotics 7              Reason for Consult: Osteomyelitis   Referring Provider: Dr. Waymon Amato Primary Care Provider: System, Provider Not In   ASSESSMENT:  Jeremiah Burns is a 68 year old Caucasian male with poorly controlled type 2 diabetes and chronic wound of the left foot admitted with increased drainage and foul odor and found to have infected bursitis and possible osteomyelitis of the first metatarsal phalangeal joint with surgical specimens growing Proteus species, Staphylococcus simulans, and diphtheroids.  Blood cultures positive in 1 out of 4 bottles for Sphingomonas paucimobilis. POD #4 and remains clinically stable with no further surgical intervention planned at this time. Continue current dose of vancomycin and piperacillin-tazobactam while awaiting sensitivities. Post-operative wound care per Podiatry. Discussed importance of protein intake and control of blood sugars to reduce risk for complicated healing and further infection. Therapeutic drug monitoring of renal function and vancomycin levels. Standard/universal precautions. Remaining medical and supportive care per Primary Team.    PLAN:  Continue current dose of vancomycin and piperacillin-tazobactam. Therapeutic drug monitoring of renal function and vancomycin levels. Monitor blood cultures for clearance of bacteremia and surgical cultures for organism sensitivities. Postoperative wound care per podiatry. Optimize blood sugar control and protein intake for healing. Standard/universal precautions. Remaining medical and supportive care per primary team.   Principal Problem:   Diabetic foot infection (HCC) Active Problems:   Hyponatremia   AKI (acute kidney injury) (HCC)   Insulin dependent type 2 diabetes mellitus (HCC)   Pyogenic inflammation of bone (HCC)   Bursitis of left foot    (feeding supplement) PROSource  Plus  30 mL Oral BID BM   ascorbic acid  500 mg Oral Daily   aspirin EC  81 mg Oral Daily   atorvastatin  40 mg Oral Daily   clopidogrel  75 mg Oral Q breakfast   enoxaparin (LOVENOX) injection  40 mg Subcutaneous Daily   feeding supplement  237 mL Oral BID BM   insulin aspart  0-5 Units Subcutaneous QHS   insulin aspart  0-9 Units Subcutaneous TID WC   insulin aspart  3 Units Subcutaneous TID WC   insulin glargine-yfgn  10 Units Subcutaneous BID   multivitamin with minerals  1 tablet Oral Daily   nutrition supplement (JUVEN)  1 packet Oral BID BM   pantoprazole  40 mg Oral Daily   polyethylene glycol  17 g Oral Daily   senna-docusate  1 tablet Oral BID   sodium chloride flush  3 mL Intravenous Q12H   zinc sulfate (50mg  elemental zinc)  220 mg Oral Daily     HPI: Jeremiah Burns is a 69 y.o. male with previous medical history of poorly controlled type 2 diabetes presenting from podiatry office with concern for osteomyelitis.  [name] has been followed by podiatry for a chronic wound of the left foot for approximately 1 year.  Noted to have increased drainage and foul odor.  X-ray performed in podiatry office concerning for osteolysis of the first MTP joint and concerning for osteomyelitis.  Afebrile on admission with no leukocytosis.  MRI of the right foot with no definite evidence of osteomyelitis although slight interval progression of advanced degenerative arthropathy of the first MTP joint and cannot rule out septic arthritis.  Brought to the OR for irrigation and debridement and found to have infected bursitis of  the plantar first MPJ of the left foot and possible osteomyelitis of the first metatarsal phalangeal joint of the left foot.  Blood cultures from 11/19/2023 with 1 out of 4 bottles growing Sphingomonas paucimobilis.  Surgical specimens from 4/4 showing abundant Staphylococcus simulans, moderate diphtheroids, and Proteus species.  Sensitivities remain pending.  Repeat blood  cultures from 11/23/2023 are without growth to date in <24 hours.  Albumin levels normal.   Review of Systems: Review of Systems  Constitutional:  Negative for chills, fever and weight loss.  Respiratory:  Negative for cough, shortness of breath and wheezing.   Cardiovascular:  Negative for chest pain and leg swelling.  Gastrointestinal:  Negative for abdominal pain, constipation, diarrhea, nausea and vomiting.  Skin:  Negative for rash.     Past Medical History:  Diagnosis Date   Diabetes mellitus (HCC)     Social History   Tobacco Use   Smoking status: Never   Smokeless tobacco: Never  Vaping Use   Vaping status: Never Used  Substance Use Topics   Alcohol use: Never   Drug use: Never    History reviewed. No pertinent family history.  No Known Allergies  OBJECTIVE: Blood pressure (!) 181/91, pulse 72, temperature 98.2 F (36.8 C), temperature source Oral, resp. rate 19, height 5\' 11"  (1.803 m), weight 94.4 kg, SpO2 96%.  Physical Exam Constitutional:      General: He is not in acute distress.    Appearance: He is well-developed.  Cardiovascular:     Rate and Rhythm: Normal rate and regular rhythm.     Heart sounds: Normal heart sounds.  Pulmonary:     Effort: Pulmonary effort is normal.     Breath sounds: Normal breath sounds.  Skin:    General: Skin is warm and dry.  Neurological:     Mental Status: He is alert and oriented to person, place, and time.     Lab Results Lab Results  Component Value Date   WBC 6.5 11/24/2023   HGB 12.4 (L) 11/24/2023   HCT 36.4 (L) 11/24/2023   MCV 83.7 11/24/2023   PLT 230 11/24/2023    Lab Results  Component Value Date   CREATININE 1.49 (H) 11/24/2023   BUN 21 11/24/2023   NA 134 (L) 11/24/2023   K 4.1 11/24/2023   CL 105 11/24/2023   CO2 21 (L) 11/24/2023    Lab Results  Component Value Date   ALT 17 11/18/2023   AST 18 11/18/2023   ALKPHOS 39 11/18/2023   BILITOT 0.3 11/18/2023     Microbiology: Recent  Results (from the past 240 hours)  Blood culture (routine x 2)     Status: Abnormal (Preliminary result)   Collection Time: 11/19/23 12:34 AM   Specimen: BLOOD RIGHT ARM  Result Value Ref Range Status   Specimen Description BLOOD RIGHT ARM  Final   Special Requests   Final    BOTTLES DRAWN AEROBIC AND ANAEROBIC Blood Culture adequate volume   Culture  Setup Time   Final    GRAM NEGATIVE RODS AEROBIC BOTTLE ONLY CRITICAL RESULT CALLED TO, READ BACK BY AND VERIFIED WITH: PHARMD Talmadge Chad [id number] @2122  FH    Culture (A)  Final    SPHINGOMONAS PAUCIMOBILIS SUSCEPTIBILITIES TO FOLLOW Performed at [facility], [address] [place], [place] [address]    Report Status PENDING  Incomplete  Blood culture (routine x 2)     Status: None   Collection Time: 11/19/23 12:34 AM  Specimen: BLOOD LEFT ARM  Result Value Ref Range Status   Specimen Description BLOOD LEFT ARM  Final   Special Requests   Final    BOTTLES DRAWN AEROBIC AND ANAEROBIC Blood Culture adequate volume   Culture   Final    NO GROWTH 5 DAYS Performed at [facility], [address] [place], [place] [address]    Report Status 11/24/2023 FINAL  Final  Blood Culture ID Panel (Reflexed)     Status: None   Collection Time: 11/19/23 12:34 AM  Result Value Ref Range Status   Enterococcus faecalis NOT DETECTED NOT DETECTED Final   Enterococcus Faecium NOT DETECTED NOT DETECTED Final   Listeria monocytogenes NOT DETECTED NOT DETECTED Final   Staphylococcus species NOT DETECTED NOT DETECTED Final   Staphylococcus aureus (BCID) NOT DETECTED NOT DETECTED Final   Staphylococcus epidermidis NOT DETECTED NOT DETECTED Final   Staphylococcus lugdunensis NOT DETECTED NOT DETECTED Final   Streptococcus species NOT DETECTED NOT DETECTED Final   Streptococcus agalactiae NOT DETECTED NOT DETECTED Final   Streptococcus pneumoniae NOT DETECTED NOT DETECTED Final   Streptococcus pyogenes NOT DETECTED NOT DETECTED Final    A.calcoaceticus-baumannii NOT DETECTED NOT DETECTED Final   Bacteroides fragilis NOT DETECTED NOT DETECTED Final   Enterobacterales NOT DETECTED NOT DETECTED Final   Enterobacter cloacae complex NOT DETECTED NOT DETECTED Final   Escherichia coli NOT DETECTED NOT DETECTED Final   Klebsiella aerogenes NOT DETECTED NOT DETECTED Final   Klebsiella oxytoca NOT DETECTED NOT DETECTED Final   Klebsiella pneumoniae NOT DETECTED NOT DETECTED Final   Proteus species NOT DETECTED NOT DETECTED Final   Salmonella species NOT DETECTED NOT DETECTED Final   Serratia marcescens NOT DETECTED NOT DETECTED Final   Haemophilus influenzae NOT DETECTED NOT DETECTED Final   Neisseria meningitidis NOT DETECTED NOT DETECTED Final   Pseudomonas aeruginosa NOT DETECTED NOT DETECTED Final   Stenotrophomonas maltophilia NOT DETECTED NOT DETECTED Final   Candida albicans NOT DETECTED NOT DETECTED Final   Candida auris NOT DETECTED NOT DETECTED Final   Candida glabrata NOT DETECTED NOT DETECTED Final   Candida krusei NOT DETECTED NOT DETECTED Final   Candida parapsilosis NOT DETECTED NOT DETECTED Final   Candida tropicalis NOT DETECTED NOT DETECTED Final   Cryptococcus neoformans/gattii NOT DETECTED NOT DETECTED Final    Comment: Performed at [facility], [address] [place], [place] [address]  Surgical pcr screen     Status: None   Collection Time: 11/21/23  6:27 AM   Specimen: Nasal Mucosa; Nasal Swab  Result Value Ref Range Status   MRSA, PCR NEGATIVE NEGATIVE Final   Staphylococcus aureus NEGATIVE NEGATIVE Final    Comment: (NOTE) The Xpert SA Assay (FDA approved for NASAL specimens in patients 76 years of age and older), is one component of a comprehensive surveillance program. It is not intended to diagnose infection nor to guide or monitor treatment. Performed at [facility], [address], [place], [place] [address]   Aerobic/Anaerobic Culture w Gram Stain (surgical/deep wound)      Status: None (Preliminary result)   Collection Time: 11/21/23 12:33 PM   Specimen: Path Tissue  Result Value Ref Range Status   Specimen Description TISSUE  Final   Special Requests left foot ulcer  Final   Gram Stain   Final    NO WBC SEEN RARE GRAM POSITIVE COCCI IN PAIRS Performed at [facility], [address] [place], [place] [address]  Culture   Final    ABUNDANT STAPHYLOCOCCUS SIMULANS MODERATE DIPHTHEROIDS(CORYNEBACTERIUM SPECIES) Standardized susceptibility testing for this organism is not available. RARE PROTEUS SPECIES SUSCEPTIBILITIES TO FOLLOW NO ANAEROBES ISOLATED; CULTURE IN PROGRESS FOR 5 DAYS    Report Status PENDING  Incomplete  Culture, blood (Routine X 2) w Reflex to ID Panel     Status: None (Preliminary result)   Collection Time: 11/23/23  4:39 PM   Specimen: BLOOD  Result Value Ref Range Status   Specimen Description BLOOD BLOOD LEFT ARM  Final   Special Requests   Final    AEROBIC BOTTLE ONLY Blood Culture results may not be optimal due to an inadequate volume of blood received in culture bottles   Culture   Final    NO GROWTH < 24 HOURS Performed at [facility], [address], [place], [place] [address]    Report Status PENDING  Incomplete  Culture, blood (Routine X 2) w Reflex to ID Panel     Status: None (Preliminary result)   Collection Time: 11/23/23  4:40 PM   Specimen: BLOOD  Result Value Ref Range Status   Specimen Description BLOOD BLOOD LEFT ARM  Final   Special Requests   Final    AEROBIC BOTTLE ONLY Blood Culture results may not be optimal due to an inadequate volume of blood received in culture bottles   Culture   Final    NO GROWTH < 24 HOURS Performed at [facility], [address], [place], [place] [address]    Report Status PENDING  Incomplete    I have personally spent 32 minutes involved in face-to-face and non-face-to-face activities for this patient on the day of the visit. Professional time spent  includes the following activities: Preparing to see the patient (review of tests), Obtaining and/or reviewing separately obtained history (admission/discharge record), Performing a medically appropriate examination and/or evaluation , Ordering medications/tests/procedures, referring and communicating with other health care professionals, Documenting clinical information in the EMR, Independently interpreting results (not separately reported), Communicating results to the patient/family/caregiver, Counseling and educating the patient/family/caregiver and Care coordination (not separately reported).    Marcos Eke, NP [facility]  11/24/2023  3:57 PM

## 2023-11-24 NOTE — TOC Initial Note (Signed)
 Transition of Care [facility]) - Initial/Assessment Note    Patient Details  Name: Jeremiah Burns MRN: [id number] Date of Birth: 04/30/1956  [facility] CM/SW Contact:    Eduard Roux, LCSW Phone Number: 11/24/2023, 3:01 PM  Clinical Narrative:                  CSW met with patient at bedside. CSW introduced self and explained role. Patient states he lives home alone. Patient states he is aware of recommendations for short term rehab at SNF. He acknowledges the need for rehab and is agreeable to SNF placement. CSW explained the SNF process. No preferred SNF at this time.   TOC will provide bed offers once available  Antony Blackbird, MSW, LCSW Clinical Social Worker    Expected Discharge Plan: Skilled Nursing Facility Barriers to Discharge: Continued Medical Work up   Patient Goals and CMS Choice            Expected Discharge Plan and Services In-house Referral: Clinical Social Work     Living arrangements for the past 2 months: Mobile Home                                      Prior Living Arrangements/Services Living arrangements for the past 2 months: Mobile Home Lives with:: Self Patient language and need for interpreter reviewed:: Yes Do you feel safe going back to the place where you live?: Yes      Need for Family Participation in Patient Care: Yes (Comment) Care giver support system in place?: No (comment)   Criminal Activity/Legal Involvement Pertinent to Current Situation/Hospitalization: No - Comment as needed  Activities of Daily Living   ADL Screening (condition at time of admission) Independently performs ADLs?: Yes (appropriate for developmental age) Is the patient deaf or have difficulty hearing?: No Does the patient have difficulty seeing, even when wearing glasses/contacts?: No Does the patient have difficulty concentrating, remembering, or making decisions?: No  Permission Sought/Granted   Permission granted to share  information with : Yes, Verbal Permission Granted  Share Information with NAME: Jonny Ruiz  Permission granted to share info w AGENCY: [facility]  Permission granted to share info w Relationship: friend  Permission granted to share info w Contact Information: [phone]  Emotional Assessment Appearance:: Appears stated age Attitude/Demeanor/Rapport: Engaged Affect (typically observed): Appropriate Orientation: : Oriented to Self, Oriented to Place, Oriented to  Time, Oriented to Situation Alcohol / Substance Use: Never Used, Tobacco Use, Alcohol Use, Illicit Drugs Psych Involvement: No (comment)  Admission diagnosis:  Diabetic foot infection (HCC) [X52.841, L08.9] Other osteomyelitis of left foot (HCC) [M86.8X7] Patient Active Problem List   Diagnosis Date Noted   Bursitis of left foot 11/21/2023   Diabetic foot infection (HCC) 11/19/2023   Hyponatremia 11/19/2023   AKI (acute kidney injury) (HCC) 11/19/2023   Insulin dependent type 2 diabetes mellitus (HCC) 11/19/2023   Pyogenic inflammation of bone (HCC) 11/19/2023   PCP:  System, Provider Not In Pharmacy:   [facility] [address] [place] - [address] [facility] [place] [address] Phone: [phone] Fax: [phone]     Social Drivers of Health (SDOH) Social History: SDOH Screenings   Food Insecurity: No Food Insecurity (11/19/2023)  Housing: Low Risk  (11/19/2023)  Transportation Needs: No Transportation Needs (11/19/2023)  Utilities: At Risk (11/19/2023)  Financial Resource Strain: Not at Risk (08/07/2023)   Received from  [facility]  Physical Activity: At Risk (08/07/2023)   [facility]  Social Connections: Moderately Integrated (11/19/2023)  Stress: Not at Risk (08/07/2023)   [facility] from [facility]  Tobacco Use: Low Risk  (11/21/2023)   SDOH Interventions: Utilities Interventions: Inpatient TOC   Readmission Risk Interventions     No data to display

## 2023-11-24 NOTE — Progress Notes (Addendum)
 PROGRESS NOTE    Jeremiah Burns  WJX:[id number] DOB: [birth year] DOA: 11/18/2023 PCP: System, Provider Not In    Brief Narrative:   Jeremiah Burns is a 68 y.o. male with past medical history significant for type 2 diabetes mellitus with peripheral neuropathy, PAD, chronic ulceration left plantar first MPJ who was seen by podiatry in [place] on 4/1 and advised to go to the ED for further evaluation management for concern of diabetic foot infection versus osteomyelitis.  Patient was seen by vascular surgery and underwent angioplasty on 4/3.  MRI of foot negative for osteomyelitis but podiatry concerned and patient underwent I&D, tibial sesamoidectomy, bone biopsy with placement of Integra allograft and antibiotic beads by Dr. Annamary Rummage on 4/4.  Assessment & Plan:    Diabetic Left foot infection/cellulitis/bacteremia Patient presenting by discretion of outside podiatrist for concern of diabetic foot infection with possible underlying osteomyelitis.  MR left foot with and without contrast 4/2 with advanced degenerative arthropathy first MTP joint, unable to exclude septic arthritis, no definite osteomyelitis, plantar ulceration forefoot overlying first MTP joint with findings concerning for cellulitis without abscess. Following left lower extremity angioplasty by vascular surgery, underwent I&D, tibial sesamoidectomy, bone biopsy with placement of Integra allograft and antibiotic beads by Dr. Annamary Rummage on 4/4. -- Blood cultures x 2/2: 1 of 4 bottles shows Sphingomonas Paucimobilis-susceptibilities pending. -- Operative culture 4/4: abundant Staphylococcus Simulans, Moderate Diphtheroids, rare Proteus species, susceptibilities pending. -- Bone biopsy/pathology: Pending Continue empirically started IV vancomycin and Zosyn.  Pending final culture sensitivity results, to determine choice and duration of antibiotics. -- Nonweightbearing per podiatry in postoperative shoe Surveillance blood cultures  from 4/6: Negative to date. Dr. Ardelle Anton, Podiatry has seen today and changed to nonadherent dressing.  Podiatry to advise when they feel patient is medically ready for DC to next level of care.  PT and OT recommend SNF, TOC consulted for same. Requested ID consultation to assist with antimicrobial therapies.  Left lower extremity critical limb ischemia PAD/PVD Seen by vascular surgery and s/p left PTA angioplasty and AK popliteal artery shockwave angioplasty 11/20/2023 by Dr. Chestine Spore.   -- Aspirin 81 mg p.o. daily -- Plavix 75 mg p.o. daily -- Started on atorvastatin 40 mg p.o. daily Vascular surgery signed off and have follow-up arranged in May.  Type 2 diabetes mellitus with peripheral neuropathy, with hyperglycemia Hemoglobin A1c 8.9 on 4/2, poorly controlled. As communicated with pharmacy, initiating low-dose Semglee 10 units twice daily.  Was on higher dose Lantus PTA. Monitor closely and adjust insulins as needed..  Will add low-dose mealtime NovoLog.  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  Presenting with a creatinine of 1.45. Creatinine has been labile in the hospital but mostly in the 1.4 range, which may be his new stable baseline. Avoid nephrotoxins and monitor.  Hyperlipidemia -- Started on atorvastatin 40 mg p.o. daily  Hyponatremia: Resolved when corrected for hyperglycemia  Constipation due to slow transit Ongoing despite bowel regimen, Dulcolax suppository today.  If fails then enema   DVT prophylaxis: enoxaparin (LOVENOX) injection 40 mg Start: 11/22/23 1000 SCDs Start: 11/19/23 0316    Code Status: Full Code Family Communication: No family present at bedside this morning  Disposition Plan:  Level of care: Med-Surg Status is: Inpatient Remains inpatient appropriate because: IV antibiotics.  Pending podiatry clearance and ID input, SNF.  TOC consulted.    Consultants:  Vascular surgery Podiatry Infectious  disease  Procedures:  LLE angiogram s/p left posterior tibial artery/popliteal artery angioplasty, Dr. Chestine Spore  4/3 I&D left foot with tibial sesamoidectomy, bone biopsy and placement of antibiotic beads Integra allograft, Dr. Annamary Rummage 4/4   Antimicrobials:  Vancomycin 4/1>> Zosyn 4/1>>   Subjective: Despite initiating bowel regimen, patient reports no BM for the last 6 days.  Abdomen soft but not painful.  No nausea or vomiting.  Passing some flatus.  Willing to try a suppository and if that fails, enema enema.  Objective: Vitals:   11/23/23 2308 11/24/23 0231 11/24/23 0811 11/24/23 1237  BP: (!) 152/81 (!) 159/77 (!) 142/92 (!) 168/92  Pulse: 76 77 73 77  Resp: 18 18 18 18   Temp: 98.5 F (36.9 C) 98.6 F (37 C) 98 F (36.7 C) 98.2 F (36.8 C)  TempSrc: Oral Oral Oral Oral  SpO2: 100% 100% 99% 96%  Weight:      Height:        Intake/Output Summary (Last 24 hours) at 11/24/2023 1338 Last data filed at 11/24/2023 1238 Gross per 24 hour  Intake 932.6 ml  Output 2150 ml  Net -1217.4 ml   Filed Weights   11/18/23 1816 11/21/23 0500 11/21/23 1040  Weight: 81.6 kg 94.4 kg 94.4 kg    Examination: General exam: Middle-age male, looks older than stated age, moderately built and nourished lying comfortably supine in bed without distress.  Oral mucosa moist Respiratory system: Clear to auscultation.  No increased work of breathing. Cardiovascular system: S1 & S2 heard, RRR. No JVD, murmurs, rubs, gallops or clicks. No pedal edema.  Off of telemetry Gastrointestinal system: Abdomen is soft, nondistended and nontender. No organomegaly or masses felt. Normal bowel sounds heard. Central nervous system: Alert and oriented. No focal neurological deficits. Extremities: Symmetric 5 x 5 power. Skin: Left foot dressing remains clean, dry and intact. Psychiatry: Judgement and insight appear normal. Mood & affect appropriate.      Data Reviewed: I have personally reviewed following labs  and imaging studies  CBC: Recent Labs  Lab 11/18/23 1823 11/19/23 0428 11/21/23 0359 11/22/23 0325 11/24/23 0347  WBC 8.7 8.9 6.6 8.3 6.5  NEUTROABS 5.9  --   --   --   --   HGB 14.6 13.9 12.6* 11.9* 12.4*  HCT 43.6 42.5 37.0* 35.2* 36.4*  MCV 85.2 87.8 84.1 84.2 83.7  PLT 270 247 230 221 230   Basic Metabolic Panel: Recent Labs  Lab 11/20/23 0713 11/21/23 0359 11/22/23 0325 11/23/23 0326 11/24/23 0347  NA 133* 134* 134* 134* 134*  K 4.0 4.1 4.4 4.4 4.1  CL 105 106 106 107 105  CO2 22 19* 19* 19* 21*  GLUCOSE 132* 187* 228* 181* 180*  BUN 33* 26* 27* 24* 21  CREATININE 1.42* 1.37* 1.48* 1.50* 1.49*  CALCIUM 8.3* 8.1* 8.0* 8.0* 8.5*   GFR: Estimated Creatinine Clearance: 56.4 mL/min (A) (by C-G formula based on SCr of 1.49 mg/dL (H)). Liver Function Tests: Recent Labs  Lab 11/18/23 1823  AST 18  ALT 17  ALKPHOS 39  BILITOT 0.3  PROT 7.0  ALBUMIN 3.6    CBG: Recent Labs  Lab 11/23/23 1141 11/23/23 1620 11/23/23 2138 11/24/23 0604 11/24/23 1233  GLUCAP 196* 176* 183* 159* 212*   Lipid Profile: No results for input(s): "CHOL", "HDL", "LDLCALC", "TRIG", "CHOLHDL", "LDLDIRECT" in the last 72 hours.    Recent Results (from the past 240 hours)  Blood culture (routine x 2)     Status: Abnormal (Preliminary result)   Collection Time: 11/19/23 12:34 AM   Specimen: BLOOD RIGHT ARM  Result Value  Ref Range Status   Specimen Description BLOOD RIGHT ARM  Final   Special Requests   Final    BOTTLES DRAWN AEROBIC AND ANAEROBIC Blood Culture adequate volume   Culture  Setup Time   Final    GRAM NEGATIVE RODS AEROBIC BOTTLE ONLY CRITICAL RESULT CALLED TO, READ BACK BY AND VERIFIED WITH: PHARMD Talmadge Chad [id number] @2122  FH    Culture (A)  Final    SPHINGOMONAS PAUCIMOBILIS SUSCEPTIBILITIES TO FOLLOW Performed at [facility], [address], [place], [place] [address]    Report Status PENDING  Incomplete  Blood culture (routine x 2)     Status: None    Collection Time: 11/19/23 12:34 AM   Specimen: BLOOD LEFT ARM  Result Value Ref Range Status   Specimen Description BLOOD LEFT ARM  Final   Special Requests   Final    BOTTLES DRAWN AEROBIC AND ANAEROBIC Blood Culture adequate volume   Culture   Final    NO GROWTH 5 DAYS Performed at [facility], [address], [place], [place] [address]    Report Status 11/24/2023 FINAL  Final  Blood Culture ID Panel (Reflexed)     Status: None   Collection Time: 11/19/23 12:34 AM  Result Value Ref Range Status   Enterococcus faecalis NOT DETECTED NOT DETECTED Final   Enterococcus Faecium NOT DETECTED NOT DETECTED Final   Listeria monocytogenes NOT DETECTED NOT DETECTED Final   Staphylococcus species NOT DETECTED NOT DETECTED Final   Staphylococcus aureus (BCID) NOT DETECTED NOT DETECTED Final   Staphylococcus epidermidis NOT DETECTED NOT DETECTED Final   Staphylococcus lugdunensis NOT DETECTED NOT DETECTED Final   Streptococcus species NOT DETECTED NOT DETECTED Final   Streptococcus agalactiae NOT DETECTED NOT DETECTED Final   Streptococcus pneumoniae NOT DETECTED NOT DETECTED Final   Streptococcus pyogenes NOT DETECTED NOT DETECTED Final   A.calcoaceticus-baumannii NOT DETECTED NOT DETECTED Final   Bacteroides fragilis NOT DETECTED NOT DETECTED Final   Enterobacterales NOT DETECTED NOT DETECTED Final   Enterobacter cloacae complex NOT DETECTED NOT DETECTED Final   Escherichia coli NOT DETECTED NOT DETECTED Final   Klebsiella aerogenes NOT DETECTED NOT DETECTED Final   Klebsiella oxytoca NOT DETECTED NOT DETECTED Final   Klebsiella pneumoniae NOT DETECTED NOT DETECTED Final   Proteus species NOT DETECTED NOT DETECTED Final   Salmonella species NOT DETECTED NOT DETECTED Final   Serratia marcescens NOT DETECTED NOT DETECTED Final   Haemophilus influenzae NOT DETECTED NOT DETECTED Final   Neisseria meningitidis NOT DETECTED NOT DETECTED Final   Pseudomonas aeruginosa NOT DETECTED NOT  DETECTED Final   Stenotrophomonas maltophilia NOT DETECTED NOT DETECTED Final   Candida albicans NOT DETECTED NOT DETECTED Final   Candida auris NOT DETECTED NOT DETECTED Final   Candida glabrata NOT DETECTED NOT DETECTED Final   Candida krusei NOT DETECTED NOT DETECTED Final   Candida parapsilosis NOT DETECTED NOT DETECTED Final   Candida tropicalis NOT DETECTED NOT DETECTED Final   Cryptococcus neoformans/gattii NOT DETECTED NOT DETECTED Final    Comment: Performed at [facility], [address] [place], [place] [address]  Surgical pcr screen     Status: None   Collection Time: 11/21/23  6:27 AM   Specimen: Nasal Mucosa; Nasal Swab  Result Value Ref Range Status   MRSA, PCR NEGATIVE NEGATIVE Final   Staphylococcus aureus NEGATIVE NEGATIVE Final    Comment: (NOTE) The Xpert SA Assay (FDA approved for NASAL specimens in patients 22 years of  age and older), is one component of a comprehensive surveillance program. It is not intended to diagnose infection nor to guide or monitor treatment. Performed at [facility], [address], [place], [place] [address]   Aerobic/Anaerobic Culture w Gram Stain (surgical/deep wound)     Status: None (Preliminary result)   Collection Time: 11/21/23 12:33 PM   Specimen: Path Tissue  Result Value Ref Range Status   Specimen Description TISSUE  Final   Special Requests left foot ulcer  Final   Gram Stain   Final    NO WBC SEEN RARE GRAM POSITIVE COCCI IN PAIRS Performed at [facility], [address] [place], [place] [address]    Culture   Final    ABUNDANT STAPHYLOCOCCUS SIMULANS MODERATE DIPHTHEROIDS(CORYNEBACTERIUM SPECIES) Standardized susceptibility testing for this organism is not available. RARE PROTEUS SPECIES SUSCEPTIBILITIES TO FOLLOW NO ANAEROBES ISOLATED; CULTURE IN PROGRESS FOR 5 DAYS    Report Status PENDING  Incomplete  Culture, blood (Routine X 2) w Reflex to ID Panel     Status: None (Preliminary  result)   Collection Time: 11/23/23  4:39 PM   Specimen: BLOOD  Result Value Ref Range Status   Specimen Description BLOOD BLOOD LEFT ARM  Final   Special Requests   Final    AEROBIC BOTTLE ONLY Blood Culture results may not be optimal due to an inadequate volume of blood received in culture bottles   Culture   Final    NO GROWTH < 24 HOURS Performed at [facility], [address], [place], [place] [address]    Report Status PENDING  Incomplete  Culture, blood (Routine X 2) w Reflex to ID Panel     Status: None (Preliminary result)   Collection Time: 11/23/23  4:40 PM   Specimen: BLOOD  Result Value Ref Range Status   Specimen Description BLOOD BLOOD LEFT ARM  Final   Special Requests   Final    AEROBIC BOTTLE ONLY Blood Culture results may not be optimal due to an inadequate volume of blood received in culture bottles   Culture   Final    NO GROWTH < 24 HOURS Performed at [facility], [address] [place], [place] [address]    Report Status PENDING  Incomplete         Radiology Studies: No results found.       Scheduled Meds:  (feeding supplement) PROSource Plus  30 mL Oral BID BM   ascorbic acid  500 mg Oral Daily   aspirin EC  81 mg Oral Daily   atorvastatin  40 mg Oral Daily   clopidogrel  75 mg Oral Q breakfast   enoxaparin (LOVENOX) injection  40 mg Subcutaneous Daily   feeding supplement  237 mL Oral BID BM   insulin aspart  0-5 Units Subcutaneous QHS   insulin aspart  0-9 Units Subcutaneous TID WC   insulin glargine-yfgn  10 Units Subcutaneous BID   multivitamin with minerals  1 tablet Oral Daily   nutrition supplement (JUVEN)  1 packet Oral BID BM   pantoprazole  40 mg Oral Daily   polyethylene glycol  17 g Oral Daily   senna-docusate  1 tablet Oral BID   sodium chloride flush  3 mL Intravenous Q12H   zinc sulfate (50mg  elemental zinc)  220 mg Oral Daily   Continuous Infusions:  piperacillin-tazobactam (ZOSYN)  IV 3.375 g (11/24/23  0949)   vancomycin 1,500 mg (11/24/23 0025)  LOS: 5 days    Time spent: 48 minutes spent on 11/24/2023 caring for this patient face-to-face including chart review, ordering labs/tests, documenting, discussion with nursing staff, consultants, updating family and interview/physical exam    Marcellus Scott, DO [facility] Available via Epic secure chat 7am-7pm After these hours, please refer to coverage provider listed on [email or web address] 11/24/2023, 1:38 PM

## 2023-11-24 NOTE — [place] FL2 (Signed)
 [place] MEDICAID FL2 LEVEL OF CARE FORM     IDENTIFICATION  Patient Name: Jeremiah Burns Birthdate: [birth year] Sex: male Admission Date (Current Location): 11/18/2023  [facility] and [place] Number:  [employer] and Address:  [place]. [facility], [address] [place], [place] [address]      Provider Number: [id number]  Attending Physician Name and Address:  Elease Etienne, MD  Relative Name and Phone Number:       Current Level of Care: Hospital Recommended Level of Care: Skilled Nursing Facility Prior Approval Number:    Date Approved/Denied:   PASRR Number: [id number]  Discharge Plan: SNF    Current Diagnoses: Patient Active Problem List   Diagnosis Date Noted   Bursitis of left foot 11/21/2023   Diabetic foot infection (HCC) 11/19/2023   Hyponatremia 11/19/2023   AKI (acute kidney injury) (HCC) 11/19/2023   Insulin dependent type 2 diabetes mellitus (HCC) 11/19/2023   Pyogenic inflammation of bone (HCC) 11/19/2023    Orientation RESPIRATION BLADDER Height & Weight     Self, Time, Situation, Place  Normal Continent Weight: 208 lb 1.6 oz (94.4 kg) Height:  5\' 11"  (180.3 cm)  BEHAVIORAL SYMPTOMS/MOOD NEUROLOGICAL BOWEL NUTRITION STATUS      Continent Diet (please see discharge summary)  AMBULATORY STATUS COMMUNICATION OF NEEDS Skin   Limited Assist Verbally Surgical wounds (closed incision left anterior foot, wound incision diabetic foot ulcer, left foot posterior)                       Personal Care Assistance Level of Assistance  Bathing, Feeding, Dressing Bathing Assistance: Limited assistance Feeding assistance: Independent Dressing Assistance: Limited assistance     Functional Limitations Info  Sight, Hearing, Speech Sight Info: Impaired Hearing Info: Adequate Speech Info: Adequate    SPECIAL CARE FACTORS FREQUENCY  PT (By licensed PT), OT (By licensed OT)     PT Frequency: 5x per week OT Frequency: 5x per  week            Contractures Contractures Info: Not present    Additional Factors Info  Code Status, Allergies Code Status Info: FULL Allergies Info: NKA           Current Medications (11/24/2023):  This is the current hospital active medication list Current Facility-Administered Medications  Medication Dose Route Frequency Provider Last Rate Last Admin   (feeding supplement) PROSource Plus liquid 30 mL  30 mL Oral BID BM Cephus Shelling, MD   30 mL at 11/24/23 0934   acetaminophen (TYLENOL) tablet 650 mg  650 mg Oral Q4H PRN Cephus Shelling, MD       ascorbic acid (VITAMIN C) tablet 500 mg  500 mg Oral Daily Cephus Shelling, MD   500 mg at 11/24/23 5621   aspirin EC tablet 81 mg  81 mg Oral Daily Cephus Shelling, MD   81 mg at 11/24/23 0934   atorvastatin (LIPITOR) tablet 40 mg  40 mg Oral Daily Cephus Shelling, MD   40 mg at 11/24/23 0934   bisacodyl (DULCOLAX) EC tablet 5 mg  5 mg Oral Daily PRN Elease Etienne, MD       clopidogrel (PLAVIX) tablet 75 mg  75 mg Oral Q breakfast Cephus Shelling, MD   75 mg at 11/24/23 0934   enoxaparin (LOVENOX) injection 40 mg  40 mg Subcutaneous Daily Cephus Shelling, MD   40 mg at 11/24/23 [id number]  feeding supplement (ENSURE ENLIVE / ENSURE PLUS) liquid 237 mL  237 mL Oral BID BM Cephus Shelling, MD   237 mL at 11/24/23 0937   insulin aspart (novoLOG) injection 0-5 Units  0-5 Units Subcutaneous QHS Marcellus Scott D, MD   2 Units at 11/22/23 2244   insulin aspart (novoLOG) injection 0-9 Units  0-9 Units Subcutaneous TID WC Hongalgi, Maximino Greenland, MD   2 Units at 11/24/23 1247   insulin aspart (novoLOG) injection 3 Units  3 Units Subcutaneous TID WC Hongalgi, Maximino Greenland, MD       insulin glargine-yfgn (SEMGLEE) injection 10 Units  10 Units Subcutaneous BID Elease Etienne, MD   10 Units at 11/24/23 0934   morphine (PF) 2 MG/ML injection 1 mg  1 mg Intravenous Q4H PRN Cephus Shelling, MD   1 mg at 11/24/23  0946   multivitamin with minerals tablet 1 tablet  1 tablet Oral Daily Cephus Shelling, MD   1 tablet at 11/24/23 0934   naloxone (NARCAN) injection 0.4 mg  0.4 mg Intravenous PRN Cephus Shelling, MD       nutrition supplement (JUVEN) (JUVEN) powder packet 1 packet  1 packet Oral BID BM Cephus Shelling, MD   1 packet at 11/24/23 0934   ondansetron (ZOFRAN) injection 4 mg  4 mg Intravenous Q6H PRN Cephus Shelling, MD   4 mg at 11/20/23 1439   oxyCODONE (Oxy IR/ROXICODONE) immediate release tablet 5 mg  5 mg Oral Q4H PRN Marcellus Scott D, MD   5 mg at 11/24/23 0611   pantoprazole (PROTONIX) EC tablet 40 mg  40 mg Oral Daily Jenita Seashore, RPH   40 mg at 11/24/23 0934   piperacillin-tazobactam (ZOSYN) IVPB 3.375 g  3.375 g Intravenous Q8H Cephus Shelling, MD 12.5 mL/hr at 11/24/23 0949 3.375 g at 11/24/23 0949   polyethylene glycol (MIRALAX / GLYCOLAX) packet 17 g  17 g Oral Daily Marcellus Scott D, MD   17 g at 11/24/23 0933   senna-docusate (Senokot-S) tablet 1 tablet  1 tablet Oral BID Marcellus Scott D, MD   1 tablet at 11/24/23 0934   sodium chloride flush (NS) 0.9 % injection 3 mL  3 mL Intravenous Q12H Cephus Shelling, MD   3 mL at 11/24/23 0938   sodium chloride flush (NS) 0.9 % injection 3 mL  3 mL Intravenous PRN Cephus Shelling, MD       vancomycin (VANCOREADY) IVPB 1500 mg/300 mL  1,500 mg Intravenous Q24H Cephus Shelling, MD 150 mL/hr at 11/24/23 0025 1,500 mg at 11/24/23 0025   zinc sulfate (50mg  elemental zinc) capsule 220 mg  220 mg Oral Daily Cephus Shelling, MD   220 mg at 11/24/23 3244     Discharge Medications: Please see discharge summary for a list of discharge medications.  Relevant Imaging Results:  Relevant Lab Results:   Additional Information SSN [id number]  Eduard Roux, LCSW

## 2023-11-24 NOTE — Progress Notes (Signed)
  Subjective:  Patient ID: Jeremiah Burns, male    DOB: Sep 12, 1955,  MRN: [id number]  Chief Complaint  Patient presents with   Wound Infection    DOS: 11/21/23 Procedure:  1.  Irrigation and debridement of ulceration to bone level with prep for graft, left foot 2.  Tibial sesamoidectomy, left foot 3.  Bone biopsy via trocar open superficial proximal phalanx and first metatarsal head, left foot 4.  Application of dissolvable antibiotic beads, left foot 5.  Application Integra bilayer dermal allograft, 3 x 2.5 cm, left foot    68 y.o. male seen for post op check. He denies pain. States he is going to nursing facility. Discussed follow up plans.   Review of Systems: Negative except as noted in the HPI. Denies N/V/F/Ch.   Objective:   Vitals:   11/24/23 0811 11/24/23 1237  BP: (!) 142/92 (!) 168/92  Pulse: 73 77  Resp: 18 18  Temp: 98 F (36.7 C) 98.2 F (36.8 C)  SpO2: 99% 96%   Body mass index is 29.02 kg/m. Constitutional Well developed. Well nourished.  Vascular Foot warm and well perfused. Capillary refill normal to all digits.   No calf pain with palpation  Neurologic Normal speech. Oriented to person, place, and time. Epicritic sensation diminished  Dermatologic Dressing C/D/I to left foot  Orthopedic: S/p I&D left plantar 1st MPJ   Radiographs:  Tibial sesamoidectomy and antibiotic bead placement left foot.  Pathology:  A. BONE, LEFT FOOT TIBIAL, RESECTION:  Benign bone with attached connective tissue.  Negative for inflammation and malignancy.   B. BONE, PROXIMAL PHALANX, BIOPSY:  Benign bone with focal fibrosis and reactive bone suggestive of healed osteomyelitis.  Negative for inflammation and malignancy.   C. BONE, FIRST METATARSAL, BIOPSY:  Benign bone and cartilage.  Negative for inflammation and malignancy.   Micro: S. 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  POD # 3 s/p above procedures, healing as expected post op. Graft was intact during dressing change yesterday.  -Progressing well with graft intact yesterday, will change again tmrw.  -XR: expected post op changes. - Given finding of possible healed chronic OM in proximal phalanx likely need to treat with extended duration abx. Discussed with ID, appreciate recs -WB Status: NWB in post op shoe to LLE -Sutures: staples remain intact. -Medications/ABX: per ID, follow cultures/ sens        Corinna Gab, DPM [facility] / [facility]

## 2023-11-24 NOTE — Progress Notes (Signed)
   11/24/23 1600  Spiritual Encounters  Type of Visit Initial  Care provided to: Patient  Referral source Patient request  Reason for visit Routine spiritual support  Spiritual Framework  Presenting Themes Meaning/purpose/sources of inspiration;Values and beliefs;Coping tools;Impactful experiences and emotions;Significant life change  Community/Connection Friend(s)  Patient Stress Factors Health changes  Interventions  Spiritual Care Interventions Made Established relationship of care and support;Compassionate presence;Reflective listening  Intervention Outcomes  Outcomes Awareness of support;Connection to spiritual care   Chaplain responded to consult request for prayer. Patient stated that it has been tiring being in the hospital for a while. 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 He shared some of the experiences that he had in different countries. He has a strong belief system. Chaplain provided emotional and spiritual support, brought Bible upon is request, and offered a prayer.   M.Kubra Delano Metz Resident [phone]

## 2023-11-24 NOTE — Progress Notes (Signed)
  Progress Note    11/24/2023 7:18 AM 3 Days Post-Op  Subjective:  no complaints  afebrile  Vitals:   11/23/23 2308 11/24/23 0231  BP: (!) 152/81 (!) 159/77  Pulse: 76 77  Resp: 18 18  Temp: 98.5 F (36.9 C) 98.6 F (37 C)  SpO2: 100% 100%    Physical Exam: General:  no distress Cardiac:  regular Lungs:  non labored Extremities:  brisk multiphasic left PT and peroneal doppler flow; ace wrap in place left foot.    CBC    Component Value Date/Time   WBC 6.5 11/24/2023 0347   RBC 4.35 11/24/2023 0347   HGB 12.4 (L) 11/24/2023 0347   HCT 36.4 (L) 11/24/2023 0347   PLT 230 11/24/2023 0347   MCV 83.7 11/24/2023 0347   MCH 28.5 11/24/2023 0347   MCHC 34.1 11/24/2023 0347   RDW 13.1 11/24/2023 0347   LYMPHSABS 1.5 11/18/2023 1823   MONOABS 1.0 11/18/2023 1823   EOSABS 0.2 11/18/2023 1823   BASOSABS 0.1 11/18/2023 1823    BMET    Component Value Date/Time   NA 134 (L) 11/24/2023 0347   K 4.1 11/24/2023 0347   CL 105 11/24/2023 0347   CO2 21 (L) 11/24/2023 0347   GLUCOSE 180 (H) 11/24/2023 0347   BUN 21 11/24/2023 0347   CREATININE 1.49 (H) 11/24/2023 0347   CALCIUM 8.5 (L) 11/24/2023 0347   GFRNONAA 51 (L) 11/24/2023 0347    INR No results found for: "INR"   Intake/Output Summary (Last 24 hours) at 11/24/2023 0718 Last data filed at 11/24/2023 0500 Gross per 24 hour  Intake 932.6 ml  Output 2120 ml  Net -1187.4 ml      Assessment/Plan:  68 y.o. male is s/p:  Agm with left PTA angioplasty and AK popliteal artery shockwave angioplasty 11/20/2023 by Dr. Chestine Spore.      -pt with brisk doppler flow left PT and peroneal -foot surgery wounds per podiatry -pt has follow up arranged in our office in May.  -please call with questions.    Doreatha Massed, PA-C [facility] [phone] 11/24/2023 7:18 AM

## 2023-11-25 DIAGNOSIS — E11628 Type 2 diabetes mellitus with other skin complications: Secondary | ICD-10-CM | POA: Diagnosis not present

## 2023-11-25 DIAGNOSIS — L089 Local infection of the skin and subcutaneous tissue, unspecified: Secondary | ICD-10-CM | POA: Diagnosis not present

## 2023-11-25 LAB — GLUCOSE, CAPILLARY
Glucose-Capillary: 162 mg/dL — ABNORMAL HIGH (ref 70–99)
Glucose-Capillary: 119 mg/dL — ABNORMAL HIGH (ref 70–99)
Glucose-Capillary: 190 mg/dL — ABNORMAL HIGH (ref 70–99)
Glucose-Capillary: 146 mg/dL — ABNORMAL HIGH (ref 70–99)
Glucose-Capillary: 186 mg/dL — ABNORMAL HIGH (ref 70–99)

## 2023-11-25 NOTE — Plan of Care (Signed)
  Problem: Fluid Volume: Goal: Ability to maintain a balanced intake and output will improve Outcome: Progressing   Problem: Nutritional: Goal: Maintenance of adequate nutrition will improve Outcome: Progressing   Problem: Skin Integrity: Goal: Risk for impaired skin integrity will decrease Outcome: Progressing   Problem: Tissue Perfusion: Goal: Adequacy of tissue perfusion will improve Outcome: Progressing   

## 2023-11-25 NOTE — Progress Notes (Signed)
 PROGRESS NOTE    Jeremiah Burns  ZOX:[id number] DOB: 07-03-1956 DOA: 11/18/2023 PCP: System, Provider Not In    Brief Narrative:   Jeremiah Burns is a 68 y.o. male with past medical history significant for type 2 diabetes mellitus with peripheral neuropathy, PAD, chronic ulceration left plantar first MPJ who was seen by podiatry in [place] on 4/1 and advised to go to the ED for further evaluation management for concern of diabetic foot infection versus osteomyelitis.  Patient was seen by vascular surgery and underwent angioplasty on 4/3.  MRI of foot negative for osteomyelitis but podiatry concerned and patient underwent I&D, tibial sesamoidectomy, bone biopsy with placement of Integra allograft and antibiotic beads by Dr. Annamary Rummage on 4/4.  Assessment & Plan:    Diabetic Left foot infection (polymicrobial)/cellulitis/bacteremia Patient presenting by discretion of outside podiatrist for concern of diabetic foot infection with possible underlying osteomyelitis.  MR left foot with and without contrast 4/2 with advanced degenerative arthropathy first MTP joint, unable to exclude septic arthritis, no definite osteomyelitis, plantar ulceration forefoot overlying first MTP joint with findings concerning for cellulitis without abscess. Following left lower extremity angioplasty by vascular surgery, underwent I&D, tibial sesamoidectomy, bone biopsy with placement of Integra allograft and antibiotic beads by Dr. Annamary Rummage on 4/4. -- Blood cultures x 2/2: 1 of 4 bottles shows Sphingomonas Paucimobilis-susceptibilities remain pending.  Discussed with whole ID team on rounds-suspect that this is not significant since it cleared even prior to initiation of antibiotics. -- Operative culture 4/4: abundant Staphylococcus Simulans, Moderate Diphtheroids, rare Proteus species, susceptibilities are now back for Staphylococcus and Proteus -- Bone biopsy/pathology: Pending Continue empirically started IV  vancomycin and Zosyn as per ID and await their final recommendations for choice and duration of antibiotics. -- Nonweightbearing per podiatry in postoperative shoe Surveillance blood cultures from 4/6: Negative to date. Podiatry continue to see.  Nonweightbearing in postop shoe to LLE.  Left lower extremity critical limb ischemia PAD/PVD Seen by vascular surgery and s/p left PTA angioplasty and AK popliteal artery shockwave angioplasty 11/20/2023 by Dr. Chestine Spore.   -- Aspirin 81 mg p.o. daily -- Plavix 75 mg p.o. daily -- Started on atorvastatin 40 mg p.o. daily Vascular surgery signed off and have follow-up arranged in May.  Type 2 diabetes mellitus with peripheral neuropathy, with hyperglycemia Hemoglobin A1c 8.9 on 4/2, poorly controlled. As communicated with pharmacy, initiating low-dose Semglee 10 units twice daily.  Was on higher dose Lantus PTA. Monitor closely and adjust insulins as needed..  Added low-dose mealtime NovoLog.  Better controlled.  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  Presenting with a creatinine of 1.45. Creatinine has been labile in the hospital but mostly in the 1.4 range, which may be his new stable baseline. Avoid nephrotoxins and monitor. Follow BMP in AM.  Hyperlipidemia -- Started on atorvastatin 40 mg p.o. daily  Hyponatremia: Resolved when corrected for hyperglycemia  Constipation due to slow transit Ongoing despite bowel regimen, responded to collect suppository and had BM on 4/7.  Continue bowel regimen.   DVT prophylaxis: enoxaparin (LOVENOX) injection 40 mg Start: 11/22/23 1000 SCDs Start: 11/19/23 0316    Code Status: Full Code Family Communication: No family present at bedside this morning  Disposition Plan:  Level of care: Med-Surg Status is: Inpatient Remains inpatient appropriate because: IV antibiotics.  Pending podiatry clearance and ID clearance, SNF.  TOC consulted.    Consultants:  Vascular  surgery Podiatry Infectious disease  Procedures:  LLE angiogram s/p left posterior  tibial artery/popliteal artery angioplasty, Dr. Chestine Spore 4/3 I&D left foot with tibial sesamoidectomy, bone biopsy and placement of antibiotic beads Integra allograft, Dr. Annamary Rummage 4/4   Antimicrobials:  Vancomycin 4/1>> Zosyn 4/1>>   Subjective: Had a BM yesterday.  Denies complaints.  Did not report of pain.  Objective: Vitals:   11/24/23 2201 11/25/23 0346 11/25/23 0355 11/25/23 0730  BP:  118/65  (!) 141/83  Pulse:  94  85  Resp: 20 20 17 18   Temp:  98.3 F (36.8 C)  98 F (36.7 C)  TempSrc:    Oral  SpO2:  96%  98%  Weight:      Height:        Intake/Output Summary (Last 24 hours) at 11/25/2023 1118 Last data filed at 11/25/2023 0349 Gross per 24 hour  Intake 143.9 ml  Output 1650 ml  Net -1506.1 ml   Filed Weights   11/18/23 1816 11/21/23 0500 11/21/23 1040  Weight: 81.6 kg 94.4 kg 94.4 kg    Examination: General exam: Middle-age male, looks older than stated age, moderately built and nourished lying comfortably supine in bed without distress.  Oral mucosa moist Respiratory system: Clear to auscultation.  No increased work of breathing. Cardiovascular system: S1 & S2 heard, RRR. No JVD, murmurs, rubs, gallops or clicks. No pedal edema.  Off of telemetry Gastrointestinal system: Abdomen is soft, nondistended and nontender. No organomegaly or masses felt. Normal bowel sounds heard. Central nervous system: Alert and oriented. No focal neurological deficits. Extremities: Symmetric 5 x 5 power. Skin: Left foot dressing clean and dry and intact. Psychiatry: Judgement and insight appear normal. Mood & affect appropriate.      Data Reviewed: I have personally reviewed following labs and imaging studies  CBC: Recent Labs  Lab 11/18/23 1823 11/19/23 0428 11/21/23 0359 11/22/23 0325 11/24/23 0347  WBC 8.7 8.9 6.6 8.3 6.5  NEUTROABS 5.9  --   --   --   --   HGB 14.6 13.9 12.6*  11.9* 12.4*  HCT 43.6 42.5 37.0* 35.2* 36.4*  MCV 85.2 87.8 84.1 84.2 83.7  PLT 270 247 230 221 230   Basic Metabolic Panel: Recent Labs  Lab 11/20/23 0713 11/21/23 0359 11/22/23 0325 11/23/23 0326 11/24/23 0347  NA 133* 134* 134* 134* 134*  K 4.0 4.1 4.4 4.4 4.1  CL 105 106 106 107 105  CO2 22 19* 19* 19* 21*  GLUCOSE 132* 187* 228* 181* 180*  BUN 33* 26* 27* 24* 21  CREATININE 1.42* 1.37* 1.48* 1.50* 1.49*  CALCIUM 8.3* 8.1* 8.0* 8.0* 8.5*   GFR: Estimated Creatinine Clearance: 56.4 mL/min (A) (by C-G formula based on SCr of 1.49 mg/dL (H)). Liver Function Tests: Recent Labs  Lab 11/18/23 1823  AST 18  ALT 17  ALKPHOS 39  BILITOT 0.3  PROT 7.0  ALBUMIN 3.6    CBG: Recent Labs  Lab 11/24/23 1627 11/24/23 2159 11/24/23 2329 11/25/23 0347 11/25/23 0808  GLUCAP 165* 156* 175* 162* 119*   Lipid Profile: No results for input(s): "CHOL", "HDL", "LDLCALC", "TRIG", "CHOLHDL", "LDLDIRECT" in the last 72 hours.    Recent Results (from the past 240 hours)  Blood culture (routine x 2)     Status: Abnormal (Preliminary result)   Collection Time: 11/19/23 12:34 AM   Specimen: BLOOD RIGHT ARM  Result Value Ref Range Status   Specimen Description BLOOD RIGHT ARM  Final   Special Requests   Final    BOTTLES DRAWN AEROBIC AND ANAEROBIC Blood Culture adequate  volume   Culture  Setup Time   Final    GRAM NEGATIVE RODS AEROBIC BOTTLE ONLY CRITICAL RESULT CALLED TO, READ BACK BY AND VERIFIED WITH: PHARMD Talmadge Chad [id number] @2122  FH    Culture (A)  Final    SPHINGOMONAS PAUCIMOBILIS CULTURE REINCUBATED FOR BETTER GROWTH Performed at [facility], [address], [place], [place] [address]    Report Status PENDING  Incomplete  Blood culture (routine x 2)     Status: None   Collection Time: 11/19/23 12:34 AM   Specimen: BLOOD LEFT ARM  Result Value Ref Range Status   Specimen Description BLOOD LEFT ARM  Final   Special Requests   Final    BOTTLES DRAWN  AEROBIC AND ANAEROBIC Blood Culture adequate volume   Culture   Final    NO GROWTH 5 DAYS Performed at [facility], [address] [place], [place] [address]    Report Status 11/24/2023 FINAL  Final  Blood Culture ID Panel (Reflexed)     Status: None   Collection Time: 11/19/23 12:34 AM  Result Value Ref Range Status   Enterococcus faecalis NOT DETECTED NOT DETECTED Final   Enterococcus Faecium NOT DETECTED NOT DETECTED Final   Listeria monocytogenes NOT DETECTED NOT DETECTED Final   Staphylococcus species NOT DETECTED NOT DETECTED Final   Staphylococcus aureus (BCID) NOT DETECTED NOT DETECTED Final   Staphylococcus epidermidis NOT DETECTED NOT DETECTED Final   Staphylococcus lugdunensis NOT DETECTED NOT DETECTED Final   Streptococcus species NOT DETECTED NOT DETECTED Final   Streptococcus agalactiae NOT DETECTED NOT DETECTED Final   Streptococcus pneumoniae NOT DETECTED NOT DETECTED Final   Streptococcus pyogenes NOT DETECTED NOT DETECTED Final   A.calcoaceticus-baumannii NOT DETECTED NOT DETECTED Final   Bacteroides fragilis NOT DETECTED NOT DETECTED Final   Enterobacterales NOT DETECTED NOT DETECTED Final   Enterobacter cloacae complex NOT DETECTED NOT DETECTED Final   Escherichia coli NOT DETECTED NOT DETECTED Final   Klebsiella aerogenes NOT DETECTED NOT DETECTED Final   Klebsiella oxytoca NOT DETECTED NOT DETECTED Final   Klebsiella pneumoniae NOT DETECTED NOT DETECTED Final   Proteus species NOT DETECTED NOT DETECTED Final   Salmonella species NOT DETECTED NOT DETECTED Final   Serratia marcescens NOT DETECTED NOT DETECTED Final   Haemophilus influenzae NOT DETECTED NOT DETECTED Final   Neisseria meningitidis NOT DETECTED NOT DETECTED Final   Pseudomonas aeruginosa NOT DETECTED NOT DETECTED Final   Stenotrophomonas maltophilia NOT DETECTED NOT DETECTED Final   Candida albicans NOT DETECTED NOT DETECTED Final   Candida auris NOT DETECTED NOT DETECTED Final   Candida  glabrata NOT DETECTED NOT DETECTED Final   Candida krusei NOT DETECTED NOT DETECTED Final   Candida parapsilosis NOT DETECTED NOT DETECTED Final   Candida tropicalis NOT DETECTED NOT DETECTED Final   Cryptococcus neoformans/gattii NOT DETECTED NOT DETECTED Final    Comment: Performed at [facility], [address], [place], [place] [address]  Surgical pcr screen     Status: None   Collection Time: 11/21/23  6:27 AM   Specimen: Nasal Mucosa; Nasal Swab  Result Value Ref Range Status   MRSA, PCR NEGATIVE NEGATIVE Final   Staphylococcus aureus NEGATIVE NEGATIVE Final    Comment: (NOTE) The Xpert SA Assay (FDA approved for NASAL specimens in patients 42 years of age and older), is one component of a comprehensive surveillance program. It is not intended to diagnose infection nor to guide or monitor treatment. Performed at [facility], [address] [place], [place] [address]   Aerobic/Anaerobic Culture w Gram Stain (surgical/deep wound)     Status: None (Preliminary result)   Collection Time: 11/21/23 12:33 PM   Specimen: Path Tissue  Result Value Ref Range Status   Specimen Description TISSUE  Final   Special Requests left foot ulcer  Final   Gram Stain   Final    NO WBC SEEN RARE GRAM POSITIVE COCCI IN PAIRS Performed at [facility], [address] [place], [place] [address]    Culture   Final    ABUNDANT STAPHYLOCOCCUS SIMULANS MODERATE DIPHTHEROIDS(CORYNEBACTERIUM SPECIES) Standardized susceptibility testing for this organism is not available. RARE PROTEUS PENNERI NO ANAEROBES ISOLATED; CULTURE IN PROGRESS FOR 5 DAYS    Report Status PENDING  Incomplete   Organism ID, Bacteria PROTEUS PENNERI  Final   Organism ID, Bacteria STAPHYLOCOCCUS SIMULANS  Final      Susceptibility   Proteus penneri - MIC*    AMPICILLIN >=32 RESISTANT Resistant     CEFEPIME <=0.12 SENSITIVE Sensitive     CEFTAZIDIME <=1 SENSITIVE Sensitive     CEFTRIAXONE <=0.25 SENSITIVE  Sensitive     CIPROFLOXACIN <=0.25 SENSITIVE Sensitive     GENTAMICIN <=1 SENSITIVE Sensitive     IMIPENEM 4 SENSITIVE Sensitive     TRIMETH/SULFA <=20 SENSITIVE Sensitive     AMPICILLIN/SULBACTAM 16 INTERMEDIATE Intermediate     PIP/TAZO <=4 SENSITIVE Sensitive ug/mL    * RARE PROTEUS PENNERI   Staphylococcus simulans - MIC*    CIPROFLOXACIN >=8 RESISTANT Resistant     ERYTHROMYCIN >=8 RESISTANT Resistant     GENTAMICIN <=0.5 SENSITIVE Sensitive     OXACILLIN >=4 RESISTANT Resistant     TETRACYCLINE <=1 SENSITIVE Sensitive     VANCOMYCIN <=0.5 SENSITIVE Sensitive     TRIMETH/SULFA <=10 SENSITIVE Sensitive     CLINDAMYCIN RESISTANT Resistant     RIFAMPIN <=0.5 SENSITIVE Sensitive     Inducible Clindamycin POSITIVE Resistant     * ABUNDANT STAPHYLOCOCCUS SIMULANS  Culture, blood (Routine X 2) w Reflex to ID Panel     Status: None (Preliminary result)   Collection Time: 11/23/23  4:39 PM   Specimen: BLOOD  Result Value Ref Range Status   Specimen Description BLOOD BLOOD LEFT ARM  Final   Special Requests   Final    AEROBIC BOTTLE ONLY Blood Culture results may not be optimal due to an inadequate volume of blood received in culture bottles   Culture   Final    NO GROWTH 2 DAYS Performed at [facility], [address] [place], [place] [address]    Report Status PENDING  Incomplete  Culture, blood (Routine X 2) w Reflex to ID Panel     Status: None (Preliminary result)   Collection Time: 11/23/23  4:40 PM   Specimen: BLOOD  Result Value Ref Range Status   Specimen Description BLOOD BLOOD LEFT ARM  Final   Special Requests   Final    AEROBIC BOTTLE ONLY Blood Culture results may not be optimal due to an inadequate volume of blood received in culture bottles   Culture   Final    NO GROWTH 2 DAYS Performed at [facility], [address], [place], [place] [address]    Report Status PENDING  Incomplete         Radiology Studies: No results  found.       Scheduled Meds:  (feeding supplement) PROSource Plus  30  mL Oral BID BM   ascorbic acid  500 mg Oral Daily   aspirin EC  81 mg Oral Daily   atorvastatin  40 mg Oral Daily   clopidogrel  75 mg Oral Q breakfast   enoxaparin (LOVENOX) injection  40 mg Subcutaneous Daily   feeding supplement  237 mL Oral BID BM   insulin aspart  0-5 Units Subcutaneous QHS   insulin aspart  0-9 Units Subcutaneous TID WC   insulin aspart  3 Units Subcutaneous TID WC   insulin glargine-yfgn  10 Units Subcutaneous BID   multivitamin with minerals  1 tablet Oral Daily   nutrition supplement (JUVEN)  1 packet Oral BID BM   pantoprazole  40 mg Oral Daily   polyethylene glycol  17 g Oral Daily   QUEtiapine  50 mg Oral QHS   senna-docusate  1 tablet Oral BID   sodium chloride flush  3 mL Intravenous Q12H   zinc sulfate (50mg  elemental zinc)  220 mg Oral Daily   Continuous Infusions:  piperacillin-tazobactam (ZOSYN)  IV 3.375 g (11/25/23 0820)   vancomycin 1,500 mg (11/25/23 0400)     LOS: 6 days    Time spent: 48 minutes spent on 11/25/2023 caring for this patient face-to-face including chart review, ordering labs/tests, documenting, discussion with nursing staff, consultants, updating family and interview/physical exam    Marcellus Scott, DO [facility] Available via Epic secure chat 7am-7pm After these hours, please refer to coverage provider listed on [email or web address] 11/25/2023, 11:18 AM

## 2023-11-25 NOTE — Progress Notes (Signed)
  Subjective:  Patient ID: Jeremiah Burns, male    DOB: 1956-06-02,  MRN: [id number]  Chief Complaint  Patient presents with   Wound Infection    DOS: 11/21/23 Procedure:  1.  Irrigation and debridement of ulceration to bone level with prep for graft, left foot 2.  Tibial sesamoidectomy, left foot 3.  Bone biopsy via trocar open superficial proximal phalanx and first metatarsal head, left foot 4.  Application of dissolvable antibiotic beads, left foot 5.  Application Integra bilayer dermal allograft, 3 x 2.5 cm, left foot    68 y.o. male seen for post op check. Was sleeping on arrival. He is tired today, aware we are waiting for culture data to determine outpatient abx. Discussed follow up.   Review of Systems: Negative except as noted in the HPI. Denies N/V/F/Ch.   Objective:   Vitals:   11/25/23 0355 11/25/23 0730  BP:  (!) 141/83  Pulse:  85  Resp: 17 18  Temp:  98 F (36.7 C)  SpO2:  98%   Body mass index is 29.02 kg/m. Constitutional Well developed. Well nourished.  Vascular Foot warm and well perfused. Capillary refill normal to all digits.   No calf pain with palpation  Neurologic Normal speech. Oriented to person, place, and time. Epicritic sensation diminished  Dermatologic Ulceration with integra graft intact, graft appears to be filling in underlying with appropriate early appearance.    Orthopedic: S/p I&D left plantar 1st MPJ   Radiographs:  Tibial sesamoidectomy and antibiotic bead placement left foot.  Pathology:  A. BONE, LEFT FOOT TIBIAL, RESECTION:  Benign bone with attached connective tissue.  Negative for inflammation and malignancy.   B. BONE, PROXIMAL PHALANX, BIOPSY:  Benign bone with focal fibrosis and reactive bone suggestive of healed osteomyelitis.  Negative for inflammation and malignancy.   C. BONE, FIRST METATARSAL, BIOPSY:  Benign bone and cartilage.  Negative for inflammation and malignancy.   Micro: S. 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  POD # 4 s/p above procedures, healing as expected post op -Progressing well with graft intact today, appears to be healing in appropriately. -XR: expected post op changes. - Given finding of possible healed chronic OM in proximal phalanx likely need to treat with extended duration abx. Discussed with ID, appreciate recs -WB Status: NWB in post op shoe to LLE -Sutures: staples remain intact. -Medications/ABX: per ID, follow cultures/ sens - Dressing changed and applied non adherent contact layer, 4x4 kerlix and ace - leave this dressing clean dry and intact until follow up  Next week Monday or Tuesday in [place] office. Will have office call to arrange.  -Will sign off at this time please message with questions.         Corinna Gab, DPM [facility]

## 2023-11-25 NOTE — Progress Notes (Signed)
 PT Cancellation Note  Patient Details Name: Jeremiah Burns MRN: [id number] DOB: 1956/04/11   Cancelled Treatment:    Reason Eval/Treat Not Completed: Patient declined, reported he didn't get rest last night.   [name] [facility] 11/25/2023, 11:06 AM Skip Mayer PT Acute [employer] [phone]

## 2023-11-25 NOTE — Care Management Important Message (Signed)
 Important Message  Patient Details  Name: Jeremiah Burns MRN: [id number] Date of Birth: [birth year]   Important Message Given:  Yes - Medicare IM     Dorena Bodo 11/25/2023, 2:32 PM

## 2023-11-25 NOTE — Progress Notes (Signed)
   11/25/23 1738  Mobility  Activity Dangled on edge of bed  Level of Assistance Standby assist, set-up cues, supervision of patient - no hands on  Assistive Device None  Range of Motion/Exercises Right leg;Left leg  LLE Weight Bearing Per Provider Order NWB  Activity Response Tolerated fair  Mobility Referral Yes  Mobility visit 1 Mobility  Mobility Specialist Start Time (ACUTE ONLY) 1735  Mobility Specialist Stop Time (ACUTE ONLY) 1738  Mobility Specialist Time Calculation (min) (ACUTE ONLY) 3 min   Mobility Specialist: Progress Note   Pt agreeable to mobility session - received in bed. Pt deferred further mobility stating "I went to the bathroom is that good enough?" Pt with no complaints, impulsively performing BLE leg extensions (x10). Declined wanting to sit higher in bed.  Returned to bed with all needs met - call bell within reach.  Barnie Mort, BS Mobility Specialist Please contact via SecureChat or  Rehab office at [phone].

## 2023-11-26 ENCOUNTER — Telehealth (HOSPITAL_COMMUNITY): Payer: Self-pay | Admitting: Pharmacy Technician

## 2023-11-26 ENCOUNTER — Other Ambulatory Visit (HOSPITAL_COMMUNITY): Payer: Self-pay

## 2023-11-26 DIAGNOSIS — E11628 Type 2 diabetes mellitus with other skin complications: Secondary | ICD-10-CM | POA: Diagnosis not present

## 2023-11-26 DIAGNOSIS — L089 Local infection of the skin and subcutaneous tissue, unspecified: Secondary | ICD-10-CM | POA: Diagnosis not present

## 2023-11-26 DIAGNOSIS — E1169 Type 2 diabetes mellitus with other specified complication: Principal | ICD-10-CM

## 2023-11-26 DIAGNOSIS — Z794 Long term (current) use of insulin: Secondary | ICD-10-CM

## 2023-11-26 LAB — BASIC METABOLIC PANEL WITH GFR
Anion gap: 9 (ref 5–15)
BUN: 21 mg/dL (ref 8–23)
CO2: 21 mmol/L — ABNORMAL LOW (ref 22–32)
Calcium: 8.5 mg/dL — ABNORMAL LOW (ref 8.9–10.3)
Chloride: 103 mmol/L (ref 98–111)
Creatinine, Ser: 1.32 mg/dL — ABNORMAL HIGH (ref 0.61–1.24)
GFR, Estimated: 59 mL/min — ABNORMAL LOW (ref >60)
Glucose, Bld: 209 mg/dL — ABNORMAL HIGH (ref 70–99)
Potassium: 4.2 mmol/L (ref 3.5–5.1)
Sodium: 133 mmol/L — ABNORMAL LOW (ref 135–145)

## 2023-11-26 LAB — AEROBIC/ANAEROBIC CULTURE W GRAM STAIN (SURGICAL/DEEP WOUND): Gram Stain: NONE SEEN

## 2023-11-26 LAB — GLUCOSE, CAPILLARY
Glucose-Capillary: 226 mg/dL — ABNORMAL HIGH (ref 70–99)
Glucose-Capillary: 214 mg/dL — ABNORMAL HIGH (ref 70–99)
Glucose-Capillary: 136 mg/dL — ABNORMAL HIGH (ref 70–99)
Glucose-Capillary: 167 mg/dL — ABNORMAL HIGH (ref 70–99)

## 2023-11-26 MED ORDER — SULFAMETHOXAZOLE-TRIMETHOPRIM 800-160 MG PO TABS
1.0000 | ORAL_TABLET | Freq: Two times a day (BID) | ORAL | Status: DC
  Administered 2023-11-26 – 2023-11-28 (×5): 1 via ORAL
  Filled 2023-11-26 (×5): qty 1

## 2023-11-26 MED ORDER — LINEZOLID 600 MG PO TABS
600.0000 mg | ORAL_TABLET | Freq: Two times a day (BID) | ORAL | Status: DC
  Administered 2023-11-26 – 2023-11-28 (×4): 600 mg via ORAL
  Filled 2023-11-26 (×4): qty 1

## 2023-11-26 NOTE — Progress Notes (Addendum)
 Nutrition Follow-up  DOCUMENTATION CODES:   Not applicable  INTERVENTION:  Ensure Enlive po BID, each supplement provides 350 kcal and 20 grams of protein.  D/c juven and prosource due to pt refusal  Order 2 snacks daily  Continue vit C and Zinc for wound healing  Continue MVI with minerals.   NUTRITION DIAGNOSIS:   Increased nutrient needs related to wound healing as evidenced by estimated needs. *continues  GOAL:   Patient will meet greater than or equal to 90% of their needs *ummet, progressing  MONITOR:   PO intake, Supplement acceptance, Labs, I & O's, Weight trends, Skin  REASON FOR ASSESSMENT:   Consult Assessment of nutrition requirement/status, Wound healing  ASSESSMENT:   68 y.o male with PMH of uncontrolled T2DM with peripheral neuropathy, CKD II, PAD. Presented from podiatry for further evaluation of left foot infection; diabetic foot ulcer vs osteomyelitis. MRI negative for osteomyelitis. 4/3- underwent angioplasty  4/4- bone biopsy with placement of Integra allograft and antibiotic beads   Pt finishing up with therapies upon RD arrival and had just placed lunch order. Found inchair with nonweightbearing postop shoe. Breakfast tray observed with 100% completion. Pt refusing prosource and juven. Pending SNF placement.   Medications reviewed and include: Vit C, Novolog, MVI, protonix, miralax, senna, zinc, IV antibiotics complete today.   Labs reviewed: Sodium 133 L, CBGs 119-226 x24h   Intake/Output Summary (Last 24 hours) at 11/26/2023 1407 Last data filed at 11/26/2023 0220 Gross per 24 hour  Intake 410.17 ml  Output 900 ml  Net -489.83 ml     NUTRITION - FOCUSED PHYSICAL EXAM:  Flowsheet Row Most Recent Value  Orbital Region No depletion  Upper Arm Region No depletion  Thoracic and Lumbar Region No depletion  Buccal Region No depletion  Temple Region Mild depletion  Clavicle Bone Region No depletion  Clavicle and Acromion Bone Region No  depletion  Scapular Bone Region No depletion  Dorsal Hand No depletion  Patellar Region No depletion  Anterior Thigh Region No depletion  Posterior Calf Region No depletion  Edema (RD Assessment) None  Hair Reviewed  Eyes Reviewed  Mouth Reviewed  Skin Reviewed  Nails Reviewed       Diet Order:   Diet Order             Diet regular Room service appropriate? Yes; Fluid consistency: Thin  Diet effective now                   EDUCATION NEEDS:   Not appropriate for education at this time  Skin:  Skin Assessment: Skin Integrity Issues: Skin Integrity Issues:: Diabetic Ulcer Diabetic Ulcer: Left foot  Last BM:  4/9  Height:   Ht Readings from Last 1 Encounters:  11/21/23 5\' 11"  (1.803 m)    Weight:   Wt Readings from Last 1 Encounters:  11/21/23 94.4 kg    Ideal Body Weight:  78.2 kg  BMI:  Body mass index is 29.02 kg/m.  Estimated Nutritional Needs:   Kcal:  2200-2400 kcal  Protein:  120-140 gm  Fluid:  >2L/day  Kathrynn Speed, MPH, RD, LDN Clinical Dietitian Contact information can be found at [facility].

## 2023-11-26 NOTE — Progress Notes (Signed)
 [facility]  Date of Admission:  11/18/2023     Reason for Follow Up: Diabetic foot infection (HCC)  Total days of antibiotics 9         ASSESSMENT:  Mr. Litaker sensitivities have returned in the setting of polymicrobial diabetic foot infection.  Discussed plan of care to change antibiotics to linezolid 600 mg p.o. twice daily and Bactrim 800/160 mg p.o. twice daily for additional 16 days and total of 3 weeks from surgery.  Continue postoperative wound care per podiatry.  Continue universal/standard precautions.  Monitor renal function while on Bactrim and platelet levels on linezolid.  Will arrange follow-up in ID office.  Okay for discharge from ID standpoint.  Remaining medical and supportive care per internal medicine.    PLAN:  Change antibiotics to linezolid 600 mg p.o. twice daily and Bactrim 800/160 mg p.o. twice daily for 16 days for a total of 3 weeks postsurgery. Therapeutic drug monitoring of renal function and platelet levels. Postoperative wound care per podiatry. Continue standard/universal precautions. Follow-up in ID clinic and okay for discharge from ID standpoint Remaining medical and supportive care per internal medicine.  Principal Problem:   Diabetic foot infection (HCC) Active Problems:   Hyponatremia   AKI (acute kidney injury) (HCC)   Insulin dependent type 2 diabetes mellitus (HCC)   Pyogenic inflammation of bone (HCC)   Bursitis of left foot    ascorbic acid  500 mg Oral Daily   aspirin EC  81 mg Oral Daily   atorvastatin  40 mg Oral Daily   clopidogrel  75 mg Oral Q breakfast   enoxaparin (LOVENOX) injection  40 mg Subcutaneous Daily   feeding supplement  237 mL Oral BID BM   insulin aspart  0-5 Units Subcutaneous QHS   insulin aspart  0-9 Units Subcutaneous TID WC   insulin aspart  3 Units Subcutaneous TID WC   insulin glargine-yfgn  10 Units Subcutaneous BID   linezolid  600 mg Oral Q12H   multivitamin with  minerals  1 tablet Oral Daily   pantoprazole  40 mg Oral Daily   polyethylene glycol  17 g Oral Daily   QUEtiapine  50 mg Oral QHS   senna-docusate  1 tablet Oral BID   sodium chloride flush  3 mL Intravenous Q12H   sulfamethoxazole-trimethoprim  1 tablet Oral Q12H   zinc sulfate (50mg  elemental zinc)  220 mg Oral Daily    SUBJECTIVE:  Afebrile overnight with no acute events.  Tolerating antibiotics.  Denies fevers, chills, or sweats.  Pain is slightly elevated as it was bumped into earlier this morning.  No Known Allergies   Review of Systems: Review of Systems  Constitutional:  Negative for chills, fever and weight loss.  Respiratory:  Negative for cough, shortness of breath and wheezing.   Cardiovascular:  Negative for chest pain and leg swelling.  Gastrointestinal:  Negative for abdominal pain, constipation, diarrhea, nausea and vomiting.  Musculoskeletal:        Positive for foot pain  Skin:  Negative for rash.      OBJECTIVE: Vitals:   11/25/23 2115 11/26/23 0410 11/26/23 0807 11/26/23 1210  BP:  136/81 (!) 141/88 115/68  Pulse:  83 82 88  Resp: 17 18 18 18   Temp:  97.8 F (36.6 C) 97.8 F (36.6 C)   TempSrc:  Oral Oral Oral  SpO2:  98% 97% 98%  Weight:      Height:  Body mass index is 29.02 kg/m.  Physical Exam Constitutional:      General: He is not in acute distress.    Appearance: He is well-developed.  Cardiovascular:     Rate and Rhythm: Normal rate and regular rhythm.     Heart sounds: Normal heart sounds.  Pulmonary:     Effort: Pulmonary effort is normal.     Breath sounds: Normal breath sounds.  Skin:    General: Skin is warm and dry.  Neurological:     Mental Status: He is alert and oriented to person, place, and time.  Psychiatric:        Mood and Affect: Mood normal.     Lab Results Lab Results  Component Value Date   WBC 6.5 11/24/2023   HGB 12.4 (L) 11/24/2023   HCT 36.4 (L) 11/24/2023   MCV 83.7 11/24/2023   PLT 230  11/24/2023    Lab Results  Component Value Date   CREATININE 1.32 (H) 11/26/2023   BUN 21 11/26/2023   NA 133 (L) 11/26/2023   K 4.2 11/26/2023   CL 103 11/26/2023   CO2 21 (L) 11/26/2023    Lab Results  Component Value Date   ALT 17 11/18/2023   AST 18 11/18/2023   ALKPHOS 39 11/18/2023   BILITOT 0.3 11/18/2023     Microbiology: Recent Results (from the past 240 hours)  Blood culture (routine x 2)     Status: Abnormal (Preliminary result)   Collection Time: 11/19/23 12:34 AM   Specimen: BLOOD RIGHT ARM  Result Value Ref Range Status   Specimen Description BLOOD RIGHT ARM  Final   Special Requests   Final    BOTTLES DRAWN AEROBIC AND ANAEROBIC Blood Culture adequate volume   Culture  Setup Time   Final    GRAM NEGATIVE RODS AEROBIC BOTTLE ONLY CRITICAL RESULT CALLED TO, READ BACK BY AND VERIFIED WITH: PHARMD EClinton Sawyer [id number] @2122  FH    Culture (A)  Final    SPHINGOMONAS PAUCIMOBILIS Sent to [facility] for further susceptibility testing. Performed at [facility], [address], [place], [place] [address]    Report Status PENDING  Incomplete  Blood culture (routine x 2)     Status: None   Collection Time: 11/19/23 12:34 AM   Specimen: BLOOD LEFT ARM  Result Value Ref Range Status   Specimen Description BLOOD LEFT ARM  Final   Special Requests   Final    BOTTLES DRAWN AEROBIC AND ANAEROBIC Blood Culture adequate volume   Culture   Final    NO GROWTH 5 DAYS Performed at [facility], [address] [place], [place] [address]    Report Status 11/24/2023 FINAL  Final  Blood Culture ID Panel (Reflexed)     Status: None   Collection Time: 11/19/23 12:34 AM  Result Value Ref Range Status   Enterococcus faecalis NOT DETECTED NOT DETECTED Final   Enterococcus Faecium NOT DETECTED NOT DETECTED Final   Listeria monocytogenes NOT DETECTED NOT DETECTED Final   Staphylococcus species NOT DETECTED NOT DETECTED Final   Staphylococcus aureus (BCID) NOT DETECTED  NOT DETECTED Final   Staphylococcus epidermidis NOT DETECTED NOT DETECTED Final   Staphylococcus lugdunensis NOT DETECTED NOT DETECTED Final   Streptococcus species NOT DETECTED NOT DETECTED Final   Streptococcus agalactiae NOT DETECTED NOT DETECTED Final   Streptococcus pneumoniae NOT DETECTED NOT DETECTED Final   Streptococcus pyogenes NOT DETECTED NOT DETECTED Final   A.calcoaceticus-baumannii NOT DETECTED NOT DETECTED  Final   Bacteroides fragilis NOT DETECTED NOT DETECTED Final   Enterobacterales NOT DETECTED NOT DETECTED Final   Enterobacter cloacae complex NOT DETECTED NOT DETECTED Final   Escherichia coli NOT DETECTED NOT DETECTED Final   Klebsiella aerogenes NOT DETECTED NOT DETECTED Final   Klebsiella oxytoca NOT DETECTED NOT DETECTED Final   Klebsiella pneumoniae NOT DETECTED NOT DETECTED Final   Proteus species NOT DETECTED NOT DETECTED Final   Salmonella species NOT DETECTED NOT DETECTED Final   Serratia marcescens NOT DETECTED NOT DETECTED Final   Haemophilus influenzae NOT DETECTED NOT DETECTED Final   Neisseria meningitidis NOT DETECTED NOT DETECTED Final   Pseudomonas aeruginosa NOT DETECTED NOT DETECTED Final   Stenotrophomonas maltophilia NOT DETECTED NOT DETECTED Final   Candida albicans NOT DETECTED NOT DETECTED Final   Candida auris NOT DETECTED NOT DETECTED Final   Candida glabrata NOT DETECTED NOT DETECTED Final   Candida krusei NOT DETECTED NOT DETECTED Final   Candida parapsilosis NOT DETECTED NOT DETECTED Final   Candida tropicalis NOT DETECTED NOT DETECTED Final   Cryptococcus neoformans/gattii NOT DETECTED NOT DETECTED Final    Comment: Performed at [facility], [address], [place], [place] [address]  Surgical pcr screen     Status: None   Collection Time: 11/21/23  6:27 AM   Specimen: Nasal Mucosa; Nasal Swab  Result Value Ref Range Status   MRSA, PCR NEGATIVE NEGATIVE Final   Staphylococcus aureus NEGATIVE NEGATIVE Final    Comment:  (NOTE) The Xpert SA Assay (FDA approved for NASAL specimens in patients 49 years of age and older), is one component of a comprehensive surveillance program. It is not intended to diagnose infection nor to guide or monitor treatment. Performed at [facility], [address] [place], [place] [address]   Aerobic/Anaerobic Culture w Gram Stain (surgical/deep wound)     Status: None   Collection Time: 11/21/23 12:33 PM   Specimen: Path Tissue  Result Value Ref Range Status   Specimen Description TISSUE  Final   Special Requests left foot ulcer  Final   Gram Stain NO WBC SEEN RARE GRAM POSITIVE COCCI IN PAIRS   Final   Culture   Final    ABUNDANT STAPHYLOCOCCUS SIMULANS MODERATE DIPHTHEROIDS(CORYNEBACTERIUM SPECIES) Standardized susceptibility testing for this organism is not available. RARE PROTEUS PENNERI NO ANAEROBES ISOLATED Performed at [facility], [address] [place], [place] [address]    Report Status 11/26/2023 FINAL  Final   Organism ID, Bacteria PROTEUS PENNERI  Final   Organism ID, Bacteria STAPHYLOCOCCUS SIMULANS  Final      Susceptibility   Proteus penneri - MIC*    AMPICILLIN >=32 RESISTANT Resistant     CEFEPIME <=0.12 SENSITIVE Sensitive     CEFTAZIDIME <=1 SENSITIVE Sensitive     CEFTRIAXONE <=0.25 SENSITIVE Sensitive     CIPROFLOXACIN <=0.25 SENSITIVE Sensitive     GENTAMICIN <=1 SENSITIVE Sensitive     IMIPENEM 4 SENSITIVE Sensitive     TRIMETH/SULFA <=20 SENSITIVE Sensitive     AMPICILLIN/SULBACTAM 16 INTERMEDIATE Intermediate     PIP/TAZO <=4 SENSITIVE Sensitive ug/mL    * RARE PROTEUS PENNERI   Staphylococcus simulans - MIC*    CIPROFLOXACIN >=8 RESISTANT Resistant     ERYTHROMYCIN >=8 RESISTANT Resistant     GENTAMICIN <=0.5 SENSITIVE Sensitive     OXACILLIN >=4 RESISTANT Resistant     TETRACYCLINE <=1 SENSITIVE Sensitive     VANCOMYCIN <=0.5 SENSITIVE Sensitive  TRIMETH/SULFA <=10 SENSITIVE Sensitive     CLINDAMYCIN RESISTANT  Resistant     RIFAMPIN <=0.5 SENSITIVE Sensitive     Inducible Clindamycin POSITIVE Resistant     * ABUNDANT STAPHYLOCOCCUS SIMULANS  Culture, blood (Routine X 2) w Reflex to ID Panel     Status: None (Preliminary result)   Collection Time: 11/23/23  4:39 PM   Specimen: BLOOD  Result Value Ref Range Status   Specimen Description BLOOD BLOOD LEFT ARM  Final   Special Requests   Final    AEROBIC BOTTLE ONLY Blood Culture results may not be optimal due to an inadequate volume of blood received in culture bottles   Culture   Final    NO GROWTH 3 DAYS Performed at [facility], [address] [place], [place] [address]    Report Status PENDING  Incomplete  Culture, blood (Routine X 2) w Reflex to ID Panel     Status: None (Preliminary result)   Collection Time: 11/23/23  4:40 PM   Specimen: BLOOD  Result Value Ref Range Status   Specimen Description BLOOD BLOOD LEFT ARM  Final   Special Requests   Final    AEROBIC BOTTLE ONLY Blood Culture results may not be optimal due to an inadequate volume of blood received in culture bottles   Culture   Final    NO GROWTH 3 DAYS Performed at [facility], [address], [place], [place] [address]    Report Status PENDING  Incomplete     Marcos Eke, NP [facility]  11/26/2023  3:26 PM

## 2023-11-26 NOTE — Progress Notes (Signed)
 Progress Note   Patient: Jeremiah Burns UEA:[id number] DOB: [birth year] DOA: 11/18/2023     7 DOS: the patient was seen and examined on 11/26/2023   Brief hospital course: Jeremiah Burns is a 68 y.o. male with past medical history significant for type 2 diabetes mellitus with peripheral neuropathy, PAD, chronic ulceration left plantar first MPJ who was seen by podiatry in [place] on 4/1 and advised to go to the ED for further evaluation management for concern of diabetic foot infection versus osteomyelitis.  Patient was seen by vascular surgery and underwent angioplasty on 4/3.  MRI of foot negative for osteomyelitis but podiatry concerned and patient underwent I&D, tibial sesamoidectomy, bone biopsy with placement of Integra allograft and antibiotic beads by Dr. Annamary Rummage on 4/4.   Assessment and Plan: L Diabetic foot infection w/ cellulitis and bacteremia  - Bactrim DS 1 tab PO q12  - Linezolid 600 mg PO q12  - Vit C 500 mg PO daily  - Zinc 220 mg PO daily  - MVI PO daily - Juven 1 packet PO bid  - PROsource plus liquid 30 mL PO bid - Ensure PO bid   - Tylenol PRN  - IV morphine 1 mg q4 hr PRN  - Oxycodone 5 mg PO q4 hr PRN   L lower extremity critical limb ischemia w/ PAD/PVD - ASA 81 mg PO daily  - Lipitor 40 mg PO daily  - Plavix 75 mg PO daily   DM2 w/ peripheral neuropathy  - Novolog SS tid and bedtime  - Novolog 3 units sq tid  - Semglee 10 units sq bid   Acute renal failure CKD 2  - Monitor   HLD - Lipitor 40 mg PO daily   Constipation  - Bisacodyl 5 mg PO daily PRN  - Miralax 17 g PO daily  - Senokot-S 1 tab PO bid   Subjective: Pt seen and examined at the bedside. Doing well with the PO antibx. Pt has been presented with bed offers and needs some time to discuss/think about it and will get back to the social worker with his final decision.   Physical Exam: Vitals:   11/25/23 1942 11/25/23 2115 11/26/23 0410 11/26/23 0807  BP: (!) 159/86  136/81 (!) 141/88   Pulse: 90  83 82  Resp: 18 17 18 18   Temp: 98 F (36.7 C)  97.8 F (36.6 C) 97.8 F (36.6 C)  TempSrc: Oral  Oral Oral  SpO2: 98%  98% 97%  Weight:      Height:       Physical Exam HENT:     Head: Normocephalic.     Mouth/Throat:     Mouth: Mucous membranes are moist.  Cardiovascular:     Rate and Rhythm: Normal rate and regular rhythm.  Pulmonary:     Effort: Pulmonary effort is normal.  Abdominal:     Palpations: Abdomen is soft.  Musculoskeletal:     Cervical back: Neck supple.     Comments: L foot wrapped   Skin:    General: Skin is warm.  Neurological:     Mental Status: He is alert. Mental status is at baseline.  Psychiatric:        Mood and Affect: Mood normal.     Disposition: Status is: Inpatient Remains inpatient appropriate because: awaiting SNF placement   Planned Discharge Destination: Skilled nursing facility    Time spent: 35 minutes  Author: Baron Hamper , MD 11/26/2023 12:09 PM  For on call review  [email or web address]

## 2023-11-26 NOTE — Plan of Care (Signed)

## 2023-11-26 NOTE — TOC Progression Note (Signed)
 Transition of Care [facility]) - Progression Note    Patient Details  Name: Jeremiah Burns MRN: [id number] Date of Birth: February 22, 1956  [facility] CM/SW Contact  Elfrieda Espino A [place], LCSW Phone Number: 11/26/2023, 12:07 PM  Clinical Narrative:     CSW met with pt at bedside to provide bed offers with Medicare.gov ratings. He stated they would discuss and make a decision on a facility and let CSW know. CSW contact information provided.   TOC will continue to follow.    Expected Discharge Plan: Skilled Nursing Facility Barriers to Discharge: Continued Medical Work up  Expected Discharge Plan and Services In-house Referral: Clinical Social Work     Living arrangements for the past 2 months: Mobile Home                                       Social Determinants of Health (SDOH) Interventions SDOH Screenings   Food Insecurity: No Food Insecurity (11/19/2023)  Housing: Low Risk  (11/19/2023)  Transportation Needs: No Transportation Needs (11/19/2023)  Utilities: At Risk (11/19/2023)  Financial Resource Strain: Not at Risk (08/07/2023)   Received from [facility]  Physical Activity: At Risk (08/07/2023)   Received from [facility]  Social Connections: Moderately Integrated (11/19/2023)  Stress: Not at Risk (08/07/2023)   Received from [facility]  Tobacco Use: Low Risk  (11/21/2023)    Readmission Risk Interventions     No data to display

## 2023-11-26 NOTE — Telephone Encounter (Signed)
 Patient Product/process development scientist completed.    The patient is insured through [facility]. Patient has Medicare and is not eligible for a copay card, but may be able to apply for patient assistance or Medicare RX Payment Plan (Patient Must reach out to their plan, if eligible for payment plan), if available.    Ran test claim for linezolid (Zyvox) 600 mg and the current 7 day co-pay is $0.00.   This test claim was processed through [facility]- copay amounts may vary at other pharmacies due to pharmacy/plan contracts, or as the patient moves through the different stages of their insurance plan.     Roland Earl, CPHT Pharmacy Technician III Certified Patient Advocate [facility] Patient Advocate Team Direct Number: [phone]  Fax: [phone]

## 2023-11-26 NOTE — Progress Notes (Signed)
 Physical Therapy Treatment Patient Details Name: Jeremiah Burns MRN: [id number] DOB: [birth year] Today's Date: 11/26/2023   History of Present Illness Pt is a 68 y.o male presented to ED 11/18/23 after podiatrist recommendation d/t diabetic ulcer on L foot. LLE angiography with L posterior tibial artery angioplasty and L above knee popliteal angioplasty on 4/3. Pt underwent I&D of L foot ulceration along with tibial sesamoidectomy. PMH; DM2 with neuropathy, PAD.    PT Comments  Pt received in supine, sleeping but easily awoken and agreeable to therapy session with heavy encouragement. Pt reports he typically gets up "about 10am" at home, needed reorientation to time of day and able to verbalize LLE NWB precs but needs verbal and tactile assist to maintain precs during functional tasks. Pt needing consistent minA for sit<>stand and shuffled hop-pivot steps from bed to chair. Once in chair, pt c/o severe fatigue and defers further gait or stair training. Patient will benefit from continued inpatient follow up therapy, <3 hours/day, he remains below functional baseline and needs consistent physical assist to safely perform transfers/gait and reminders for self-care tasks including ordering food, getting OOB, hygiene, etc, not currently able to perform tasks he will need to be safe at home alone.     If plan is discharge home, recommend the following: A lot of help with walking and/or transfers;A lot of help with bathing/dressing/bathroom;Assistance with cooking/housework;Assist for transportation;Help with stairs or ramp for entrance   Can travel by private vehicle     Yes (may do better with WC Zenaida Niece)  Equipment Recommendations  Wheelchair (measurements PT);Wheelchair cushion (measurements PT);BSC/3in1;Rolling walker (2 wheels)    Recommendations for Other Services       Precautions / Restrictions Precautions Precautions: Fall Recall of Precautions/Restrictions: Impaired Precaution/Restrictions  Comments: inconsistent compliance with LLE precs Required Braces or Orthoses: Other Brace Other Brace: post-op shoe on L foot Restrictions Weight Bearing Restrictions Per Provider Order: Yes LLE Weight Bearing Per Provider Order: Non weight bearing Other Position/Activity Restrictions: NWB in post-op shoe     Mobility  Bed Mobility Overal bed mobility: Needs Assistance Bed Mobility: Supine to Sit     Supine to sit: Contact guard     General bed mobility comments: From flat bed CGA, pt needs assist to remove blankets/covers and cues for IV line awareness    Transfers Overall transfer level: Needs assistance Equipment used: Rolling walker (2 wheels) Transfers: Sit to/from Stand Sit to Stand: Min assist Stand pivot transfers: Min assist         General transfer comment: Pt needing CGA to minA to prevent WB through LLE with sit>stand from lower bed height with posterior instability upon standing, LUE on RW and RUE pushing from bed with cues not to pull up on RW with both hands. Pt placing LLE on floor with stand>sit despite cues not to.    Ambulation/Gait Ambulation/Gait assistance: Min assist Gait Distance (Feet): 4 Feet Assistive device: Rolling walker (2 wheels) Gait Pattern/deviations: Step-to pattern, Shuffle, Trunk flexed   Gait velocity interpretation: <1.31 ft/sec, indicative of household ambulator   General Gait Details: unsteady with utilizing hop-to gait pattern, pt with increased fatigue and requested to sit in chair after ~41ft, chair pulled closer for pt safety, poor eecentric control for stand>sit. Pt with flexed trunk and downward gaze, shuffled hop toward chair on his R and forward.   Stairs Stairs:  (pt defers due to pain/fatigue)           Wheelchair Mobility     Tilt Bed  Modified Rankin (Stroke Patients Only)       Balance Overall balance assessment: Needs assistance, Mild deficits observed, not formally tested Sitting-balance  support: No upper extremity supported, Feet supported Sitting balance-Leahy Scale: Fair     Standing balance support: Bilateral upper extremity supported, Reliant on assistive device for balance Standing balance-Leahy Scale: Poor Standing balance comment: reliant on RW and external support due to WB restrictions and instability                            Communication Communication Communication: No apparent difficulties  Cognition Arousal: Alert (drowsy initially but more alert once edge of bed) Behavior During Therapy: Flat affect   PT - Cognitive impairments: Safety/Judgement, Sequencing, Attention, Initiation, Problem solving, Memory                       PT - Cognition Comments: pt unable to maintain WB precautions when moving from sit/stand, decreased awareness into deficits and safety. Decreased motivation initially, but participates with increased time/encouragement. When RD entering room at end of session, pt unable to recall most items he ordered for lunch from menu <5 mins prior. Following commands: Intact      Cueing Cueing Techniques: Verbal cues, Tactile cues, Gestural cues  Exercises General Exercises - Lower Extremity Long Arc Quad:  (verbal cues for technique but pt does not perform back due to c/o pain and fatigue and RD entering room to speak with him, pt distracted) Hip Flexion/Marching:  (verbal cues but pt does not perform back due to c/o pain and fatigue and RD entering room to speak with him, pt distracted)    General Comments General comments (skin integrity, edema, etc.): LLE covered with dressing and ace wraps, no visible drainage; Pt needing totalA to don post-op shoe on LLE and pt able to state "NWB" when prompted by PTA prior to transfers but does not maintain without physical assist.      Pertinent Vitals/Pain Pain Assessment Pain Assessment: Faces Faces Pain Scale: Hurts little more Pain Location: L foot Pain Descriptors /  Indicators: Sore, Grimacing Pain Intervention(s): Limited activity within patient's tolerance, Monitored during session, Repositioned (per RN pt not due for pain meds for ~90 mins)    Home Living                          Prior Function            PT Goals (current goals can now be found in the care plan section) Acute Rehab PT Goals Patient Stated Goal: to allow for L foot wound to heal PT Goal Formulation: With patient Time For Goal Achievement: 12/05/23 Progress towards PT goals: Progressing toward goals    Frequency    Min 2X/week      PT Plan      Co-evaluation              AM-PAC PT "6 Clicks" Mobility   Outcome Measure  Help needed turning from your back to your side while in a flat bed without using bedrails?: A Little Help needed moving from lying on your back to sitting on the side of a flat bed without using bedrails?: A Little Help needed moving to and from a bed to a chair (including a wheelchair)?: A Little Help needed standing up from a chair using your arms (e.g., wheelchair or bedside chair)?: A Little Help needed  to walk in hospital room?: Total (<74ft) Help needed climbing 3-5 steps with a railing? : Total 6 Click Score: 14    End of Session Equipment Utilized During Treatment: Gait belt;Other (comment) (LLE post-op shoe) Activity Tolerance: Patient limited by fatigue Patient left: in chair;with call bell/phone within reach;with chair alarm set (pillow under BLE to keep LLE elevated, pt given menu and landline phone and calling cafeteria to order lunch with PTA encouragement) Nurse Communication: Mobility status;Other (comment);Precautions;Weight bearing status (use gait belt, pt not always compliant with LLE NWB with transfers) PT Visit Diagnosis: Other abnormalities of gait and mobility (R26.89);Muscle weakness (generalized) (M62.81)     Time: 1115-1140 PT Time Calculation (min) (ACUTE ONLY): 25 min  Charges:    $Therapeutic  Activity: 23-37 mins PT General Charges $$ ACUTE PT VISIT: 1 Visit                     Devonte Migues P., PTA Acute Rehabilitation Services Secure Chat Preferred 9a-5:30pm Office: [phone]    Dorathy Kinsman [facility] 11/26/2023, 1:25 PM

## 2023-11-26 NOTE — Progress Notes (Signed)
 OT Cancellation Note  Patient Details Name: Jeremiah Burns MRN: [id number] DOB: 1955/11/21   Cancelled Treatment:    Reason Eval/Treat Not Completed: Patient declined, no reason specified;Fatigue/lethargy limiting ability to participate (Attempted to engage with pt in therapy to further educated on lateral scoot transfers. Pt declined, reports being too fatigued. OT will follow-up with pt as able)  11/26/2023  AB, OTR/L  Acute Rehabilitation Services  Office: [phone]   Tristan Schroeder 11/26/2023, 5:26 PM

## 2023-11-26 NOTE — Plan of Care (Signed)
  Problem: Coping: Goal: Ability to adjust to condition or change in health will improve Outcome: Progressing   Problem: Skin Integrity: Goal: Risk for impaired skin integrity will decrease Outcome: Progressing   Problem: Nutrition: Goal: Adequate nutrition will be maintained Outcome: Progressing

## 2023-11-27 ENCOUNTER — Encounter (HOSPITAL_COMMUNITY): Payer: Self-pay | Admitting: Podiatry

## 2023-11-27 DIAGNOSIS — E11628 Type 2 diabetes mellitus with other skin complications: Secondary | ICD-10-CM | POA: Diagnosis not present

## 2023-11-27 DIAGNOSIS — L089 Local infection of the skin and subcutaneous tissue, unspecified: Secondary | ICD-10-CM | POA: Diagnosis not present

## 2023-11-27 LAB — BACTERIAL ORGANISM REFLEX

## 2023-11-27 LAB — GLUCOSE, CAPILLARY
Glucose-Capillary: 223 mg/dL — ABNORMAL HIGH (ref 70–99)
Glucose-Capillary: 186 mg/dL — ABNORMAL HIGH (ref 70–99)
Glucose-Capillary: 162 mg/dL — ABNORMAL HIGH (ref 70–99)
Glucose-Capillary: 147 mg/dL — ABNORMAL HIGH (ref 70–99)

## 2023-11-27 NOTE — Plan of Care (Signed)
  Problem: Education: Goal: Ability to describe self-care measures that may prevent or decrease complications (Diabetes Survival Skills Education) will improve Outcome: Progressing Goal: Individualized Educational Video(s) Outcome: Progressing   Problem: Coping: Goal: Ability to adjust to condition or change in health will improve Outcome: Progressing   Problem: Health Behavior/Discharge Planning: Goal: Ability to identify and utilize available resources and services will improve Outcome: Progressing Goal: Ability to manage health-related needs will improve Outcome: Progressing   Problem: Metabolic: Goal: Ability to maintain appropriate glucose levels will improve Outcome: Progressing

## 2023-11-27 NOTE — Progress Notes (Signed)
 OT Cancellation Note  Patient Details Name: Stpehen Petitjean MRN: [id number] DOB: 06/12/1956   Cancelled Treatment:    Reason Eval/Treat Not Completed: Patient declined, no reason specified pt declined session stating he "had been busy all morning and getting up is too painful." Will f/u as time allows for OT intervention.  Lenor Derrick., COTA/L Acute Rehabilitation Services [phone]   Barron Schmid 11/27/2023, 10:36 AM

## 2023-11-27 NOTE — Progress Notes (Signed)
  Progress Note   Patient: Jeremiah Burns ZOX:[id number] DOB: 03/27/1956 DOA: 11/18/2023     8 DOS: the patient was seen and examined on 11/27/2023   Brief hospital course: Jeremiah Burns is a 68 y.o. male with past medical history significant for type 2 diabetes mellitus with peripheral neuropathy, PAD, chronic ulceration left plantar first MPJ who was seen by podiatry in [place] on 4/1 and advised to go to the ED for further evaluation management for concern of diabetic foot infection versus osteomyelitis. Patient was seen by vascular surgery and underwent angioplasty on 4/3. MRI of foot negative for osteomyelitis but podiatry concerned and patient underwent I&D, tibial sesamoidectomy, bone biopsy with placement of Integra allograft and antibiotic beads by Dr. Annamary Rummage on 4/4.   Assessment and Plan: L Diabetic foot infection w/ cellulitis and bacteremia  - Bactrim DS 1 tab PO q12  - Linezolid 600 mg PO q12  - Vit C 500 mg PO daily  - Zinc 220 mg PO daily  - MVI PO daily - Juven 1 packet PO bid  - PROsource plus liquid 30 mL PO bid - Ensure PO bid   - Tylenol PRN  - IV morphine 1 mg q4 hr PRN  - Oxycodone 5 mg PO q4 hr PRN    L lower extremity critical limb ischemia w/ PAD/PVD - ASA 81 mg PO daily  - Lipitor 40 mg PO daily  - Plavix 75 mg PO daily    DM2 w/ peripheral neuropathy  - Novolog SS tid and bedtime  - Novolog 3 units sq tid  - Semglee 10 units sq bid    Acute renal failure CKD 2  - Monitor    HLD - Lipitor 40 mg PO daily    Constipation  - Bisacodyl 5 mg PO daily PRN  - Miralax 17 g PO daily  - Senokot-S 1 tab PO bid   Subjective: Pt seen and examined at the bedside. Appreciate the ID follow up and antibx duration treatment plan. Pt awaiting placement via SW/case mgmt dept .  Physical Exam: Vitals:   11/26/23 1737 11/26/23 2100 11/27/23 0406 11/27/23 0732  BP: (!) 165/97 (!) 166/87 129/69 119/74  Pulse: 76 77 92 91  Resp: 18 18 18 16   Temp:  97.9 F  (36.6 C) 97.7 F (36.5 C) 97.8 F (36.6 C)  TempSrc:  Oral Oral Oral  SpO2:  99% 96% 96%  Weight:      Height:       HENT:     Head: Normocephalic.     Mouth/Throat:     Mouth: Mucous membranes are moist.  Cardiovascular:     Rate and Rhythm: Normal rate and regular rhythm.  Pulmonary:     Effort: Pulmonary effort is normal.  Abdominal:     Palpations: Abdomen is soft.  Musculoskeletal:     Cervical back: Neck supple.     Comments: L foot wrapped   Skin:    General: Skin is warm.  Neurological:     Mental Status: He is alert. Mental status is at baseline.  Psychiatric:        Mood and Affect: Mood normal.      Disposition: Status is: Inpatient Remains inpatient appropriate because: awaiting SNF placement   Planned Discharge Destination: Skilled nursing facility    Time spent: 35 minutes  Author: Baron Hamper , MD 11/27/2023 10:20 AM  For on call review [email or web address].

## 2023-11-27 NOTE — Progress Notes (Signed)
 Physical Therapy Treatment Patient Details Name: Jeremiah Burns MRN: [id number] DOB: 1955-12-30 Today's Date: 11/27/2023   History of Present Illness Pt is a 68 y.o male presented to ED 11/18/23 after podiatrist recommendation d/t diabetic ulcer on L foot. LLE angiography with L posterior tibial artery angioplasty and L above knee popliteal angioplasty on 4/3. Pt underwent I&D of L foot ulceration along with tibial sesamoidectomy. PMH; DM2 with neuropathy, PAD.    PT Comments  Pt received in wheelchair after working with OTA then eating lunch, pt agreeable to therapy session with emphasis on wheelchair parts/mgmt, wheelchair mobility, transfer safety within LLE WB restrictions. Pt limited due to c/o fatigue but much safer when mobilizing with wheelchair, needing minA to prevent veering too far to R side of hallway and heavy minA for stand pivot transfer due to instability standing at RW when compliant with LLE NWB. Patient will benefit from continued inpatient follow up therapy, <3 hours/day, pt lives alone and is not currently safe to transfer on his own without +1 physical assist.    If plan is discharge home, recommend the following: A lot of help with walking and/or transfers;A lot of help with bathing/dressing/bathroom;Assistance with cooking/housework;Assist for transportation;Help with stairs or ramp for entrance   Can travel by private vehicle      (Recommend wheelchair Zenaida Niece or PTAR if able)  Geophysical data processor (measurements PT);Wheelchair cushion (measurements PT);BSC/3in1;Rolling walker (2 wheels)    Recommendations for Other Services       Precautions / Restrictions Precautions Precautions: Fall Recall of Precautions/Restrictions: Impaired Precaution/Restrictions Comments: inconsistent compliance with LLE precs Required Braces or Orthoses: Other Brace Other Brace: post-op shoe on L foot Restrictions Weight Bearing Restrictions Per Provider Order: Yes LLE  Weight Bearing Per Provider Order: Non weight bearing Other Position/Activity Restrictions: NWB in post-op shoe     Mobility  Bed Mobility Overal bed mobility: Needs Assistance Bed Mobility: Sit to Supine       Sit to supine: Supervision   General bed mobility comments: Cues for scooting higher to [facility] prior to rotating hips from EOB to supine    Transfers Overall transfer level: Needs assistance Equipment used: Rolling walker (2 wheels) Transfers: Sit to/from Stand, Bed to chair/wheelchair/BSC Sit to Stand: Min assist Stand pivot transfers: Min assist         General transfer comment: Wheelchair>EOB using RW, CGA for STS from WC>RW with cues, but minA for pivot toward his R side with poor eccentric control to sit.    Ambulation/Gait                   Psychologist, counselling mobility: Yes Wheelchair propulsion: Both upper extremities Wheelchair parts: Needs assistance (minA) Distance: 150 Wheelchair Assistance Details (indicate cue type and reason): minA for part of distance to prevent pt from veering to R side of hallway, but good effort to propel and turn chair, min cues for improved technique for energy conservation. Pt c/o fatigue after ~158ft and needing totalA to wheel final 48ft back into his room. Pt given instruction on use of brakes, leg rest placement/removal and leg rest elevation. Pt needs reminders to place brakes prior to standing.   Tilt Bed    Modified Rankin (Stroke Patients Only)       Balance Overall balance assessment: Needs assistance, Mild deficits observed, not formally tested Sitting-balance support: No upper extremity supported, Feet supported Sitting balance-Leahy Scale:  Good     Standing balance support: Bilateral upper extremity supported, Reliant on assistive device for balance Standing balance-Leahy Scale: Poor Standing balance comment: Reliant on RW and external support  due to WB restrictions and instability                            Communication Communication Communication: No apparent difficulties  Cognition Arousal: Alert Behavior During Therapy: Flat affect   PT - Cognitive impairments: Problem solving, Safety/Judgement, Sequencing                       PT - Cognition Comments: Pt aware of precs and making effort to maintain this date but with standing activity, very poor safety awareness and difficulty attending to task unless seated. Participates with encouragement, pt seems to respond better to goal-directed tasks. Pt states "the illuminati is trying to find me a different hospital". Apparent difficulty with dual tasking. Following commands: Intact      Cueing Cueing Techniques: Verbal cues, Tactile cues, Gestural cues  Exercises      General Comments General comments (skin integrity, edema, etc.): Pt LLE dressing appears c/d/i, covered in ace bandage; pt received with post-op shoe donned      Pertinent Vitals/Pain Pain Assessment Pain Assessment: Faces Faces Pain Scale: Hurts even more Pain Location: L foot Pain Descriptors / Indicators: Sore, Grimacing, Discomfort, Moaning Pain Intervention(s): Limited activity within patient's tolerance, Monitored during session, Repositioned, Premedicated before session    Home Living                          Prior Function            PT Goals (current goals can now be found in the care plan section) Acute Rehab PT Goals Patient Stated Goal: to allow for L foot wound to heal PT Goal Formulation: With patient Time For Goal Achievement: 12/05/23 Progress towards PT goals: Progressing toward goals    Frequency    Min 2X/week      PT Plan      Co-evaluation              AM-PAC PT "6 Clicks" Mobility   Outcome Measure  Help needed turning from your back to your side while in a flat bed without using bedrails?: A Little Help needed moving from  lying on your back to sitting on the side of a flat bed without using bedrails?: A Little Help needed moving to and from a bed to a chair (including a wheelchair)?: A Little Help needed standing up from a chair using your arms (e.g., wheelchair or bedside chair)?: A Little Help needed to walk in hospital room?: Total (<46ft) Help needed climbing 3-5 steps with a railing? : Total 6 Click Score: 14    End of Session Equipment Utilized During Treatment: Gait belt;Other (comment) (LLE post-op shoe) Activity Tolerance: Patient limited by fatigue Patient left: in bed;with call bell/phone within reach;with bed alarm set;Other (comment) (LLE elevated) Nurse Communication: Mobility status;Weight bearing status;Other (comment) (pt requesting to speak with social worker about SNF) PT Visit Diagnosis: Other abnormalities of gait and mobility (R26.89);Muscle weakness (generalized) (M62.81)     Time: 6962-9528 PT Time Calculation (min) (ACUTE ONLY): 35 min  Charges:    $Therapeutic Activity: 8-22 mins $Wheel Chair Management: 8-22 mins PT General Charges $$ ACUTE PT VISIT: 1 Visit  Florina Ou., PTA Acute Rehabilitation Services Secure Chat Preferred 9a-5:30pm Office: [phone]    Dorathy Kinsman [facility] 11/27/2023, 3:51 PM

## 2023-11-27 NOTE — TOC Progression Note (Signed)
 Transition of Care [facility]) - Progression Note    Patient Details  Name: Jeremiah Burns MRN: [id number] Date of Birth: 01/21/1956  [facility] CM/SW Contact  Yarely Bebee A [place], LCSW Phone Number: 11/27/2023, 3:59 PM  Clinical Narrative:     CSW met with pt at bedside, he requested [facility] for SNF bed choice. CSW followed up with [facility], bed possibly available tomorrow, but more likely Saturday or over the weekend.   TOC team to start authorization.   Auth started, status pending, Vesta Mixer WU#[id number]    [facility] will continue to follow.  Expected Discharge Plan: Skilled Nursing Facility Barriers to Discharge: Continued Medical Work up  Expected Discharge Plan and Services In-house Referral: Clinical Social Work     Living arrangements for the past 2 months: Mobile Home                                       Social Determinants of Health (SDOH) Interventions SDOH Screenings   Food Insecurity: No Food Insecurity (11/19/2023)  Housing: Low Risk  (11/19/2023)  Transportation Needs: No Transportation Needs (11/19/2023)  Utilities: At Risk (11/19/2023)  Financial Resource Strain: Not at Risk (08/07/2023)   Received from [facility]  Physical Activity: At Risk (08/07/2023)   Received from [facility]  Social Connections: Moderately Integrated (11/19/2023)  Stress: Not at Risk (08/07/2023)   Received from [facility]  Tobacco Use: Low Risk  (11/21/2023)    Readmission Risk Interventions     No data to display

## 2023-11-27 NOTE — Plan of Care (Signed)

## 2023-11-27 NOTE — TOC Progression Note (Signed)
 Transition of Care [facility]) - Progression Note    Patient Details  Name: Jeremiah Burns MRN: [id number] Date of Birth: [birth year]  Transition of Care [facility]) CM/SW Contact  Tanyon Alipio A [place], LCSW Phone Number: 11/27/2023, 12:02 PM  Clinical Narrative:     CSW made attempt to get bed decision from pt this afternoon, pt was sleeping soundly and could not be roused. CSW will make another attempt later today.   TOC will continue to follow.   Expected Discharge Plan: Skilled Nursing Facility Barriers to Discharge: Continued Medical Work up  Expected Discharge Plan and Services In-house Referral: Clinical Social Work     Living arrangements for the past 2 months: Mobile Home                                       Social Determinants of Health (SDOH) Interventions SDOH Screenings   Food Insecurity: No Food Insecurity (11/19/2023)  Housing: Low Risk  (11/19/2023)  Transportation Needs: No Transportation Needs (11/19/2023)  Utilities: At Risk (11/19/2023)  Financial Resource Strain: Not at Risk (08/07/2023)   Received from [facility]  Physical Activity: At Risk (08/07/2023)   Received from [facility]  Social Connections: Moderately Integrated (11/19/2023)  Stress: Not at Risk (08/07/2023)   Received from [facility]  Tobacco Use: Low Risk  (11/21/2023)    Readmission Risk Interventions     No data to display

## 2023-11-27 NOTE — Progress Notes (Signed)
 Occupational Therapy Treatment Patient Details Name: Jeremiah Burns MRN: [id number] DOB: [birth year] Today's Date: 11/27/2023   History of present illness Pt is a 68 y.o male presented to ED 11/18/23 after podiatrist recommendation d/t diabetic ulcer on L foot. LLE angiography with L posterior tibial artery angioplasty and L above knee popliteal angioplasty on 4/3. Pt underwent I&D of L foot ulceration along with tibial sesamoidectomy. PMH; DM2 with neuropathy, PAD.   OT comments  Pt begrudgingly agreeable to OT intervention with a focus on practicing transfers to w/c. Pt agitated at start of session pushing his tray table across the room in frustration. Overall, pt completed supine>sit with supervision d/t impulsivity. Pt continues to have difficulty maintaining non weightbearing in LLE during transfer but pt was able to complete a stand pivot transfer to w/c with RW and CGA, +2 for safety. Patient will benefit from continued inpatient follow up therapy, <3 hours/day       If plan is discharge home, recommend the following:  A little help with walking and/or transfers;A little help with bathing/dressing/bathroom;Assistance with cooking/housework;Help with stairs or ramp for entrance   Equipment Recommendations  BSC/3in1;Wheelchair (measurements OT);Wheelchair cushion (measurements OT);Tub/shower seat    Recommendations for Other Services      Precautions / Restrictions Precautions Precautions: Fall Recall of Precautions/Restrictions: Impaired Precaution/Restrictions Comments: inconsistent compliance with LLE precs Required Braces or Orthoses: Other Brace Other Brace: post-op shoe on L foot Restrictions Weight Bearing Restrictions Per Provider Order: Yes LLE Weight Bearing Per Provider Order: Non weight bearing Other Position/Activity Restrictions: NWB in post-op shoe       Mobility Bed Mobility   Bed Mobility: Supine to Sit     Supine to sit: Supervision     General bed  mobility comments: supervision for safety however pt impulsively transitioned to EOB out of agitation    Transfers Overall transfer level: Needs assistance Equipment used: Rolling walker (2 wheels) Transfers: Sit to/from Stand, Bed to chair/wheelchair/BSC Sit to Stand: Contact guard assist, +2 safety/equipment Stand pivot transfers: Contact guard assist, +2 safety/equipment         General transfer comment: pt impulsively stood from EOB with CGA +2 for safety, pt pulling up on RW despite cues. pt completed stand pivot to w/c to L side with RW with CGA, poor adherence to NWB despite education and cues     Balance Overall balance assessment: Needs assistance, Mild deficits observed, not formally tested Sitting-balance support: No upper extremity supported, Feet supported Sitting balance-Leahy Scale: Fair     Standing balance support: Bilateral upper extremity supported, Reliant on assistive device for balance Standing balance-Leahy Scale: Poor                             ADL either performed or assessed with clinical judgement   ADL Overall ADL's : Needs assistance/impaired Eating/Feeding: Set up;Sitting                 Upper Body Dressing Details (indicate cue type and reason): declined wearing gown Lower Body Dressing: Maximal assistance;Sit to/from stand Lower Body Dressing Details (indicate cue type and reason): to don post op shoe from EOB Toilet Transfer: Contact guard assist;Stand-pivot;Rolling walker (2 wheels) Toilet Transfer Details (indicate cue type and reason): poor LLE NWB ability.         Functional mobility during ADLs: Contact guard assist;Rolling walker (2 wheels) (stand pivot only to w/c) General ADL Comments: ADL participation greatly limited by agitation and  non compliance to weightbearing restrictions    Extremity/Trunk Assessment Upper Extremity Assessment Upper Extremity Assessment: Overall WFL for tasks assessed   Lower Extremity  Assessment LLE Deficits / Details: LLE NWB   Cervical / Trunk Assessment Cervical / Trunk Assessment: Normal    Vision Baseline Vision/History: 1 Wears glasses Vision Assessment?: No apparent visual deficits   Perception Perception Perception: Not tested   Praxis Praxis Praxis: Not tested   Communication     Cognition Arousal: Alert Behavior During Therapy: Agitated Cognition: Cognition impaired     Awareness: Intellectual awareness intact, Online awareness impaired       OT - Cognition Comments: impaired awareness to situation and implications related to noncompliance with weightbearing restrictions                 Following commands: Intact        Cueing      Exercises      Shoulder Instructions       General Comments      Pertinent Vitals/ Pain       Pain Assessment Pain Assessment: 0-10 Pain Score: 10-Worst pain ever Pain Descriptors / Indicators: Sore, Grimacing, Discomfort, Moaning Pain Intervention(s): Limited activity within patient's tolerance, Monitored during session, Repositioned  Home Living                                          Prior Functioning/Environment              Frequency  Min 2X/week        Progress Toward Goals  OT Goals(current goals can now be found in the care plan section)  Progress towards OT goals: Progressing toward goals  Acute Rehab OT Goals Patient Stated Goal: "to get out of this hospital" OT Goal Formulation: With patient Time For Goal Achievement: 12/07/23 Potential to Achieve Goals: Fair  Plan      Co-evaluation                 AM-PAC OT "6 Clicks" Daily Activity     Outcome Measure   Help from another person eating meals?: None Help from another person taking care of personal grooming?: A Little Help from another person toileting, which includes using toliet, bedpan, or urinal?: A Little Help from another person bathing (including washing, rinsing,  drying)?: A Lot Help from another person to put on and taking off regular upper body clothing?: A Little Help from another person to put on and taking off regular lower body clothing?: A Lot 6 Click Score: 17    End of Session Equipment Utilized During Treatment: Gait belt;Rolling walker (2 wheels);Other (comment) (post op shoe, w/c)  OT Visit Diagnosis: Unsteadiness on feet (R26.81);Other abnormalities of gait and mobility (R26.89);Pain Pain - Right/Left: Left Pain - part of body: Ankle and joints of foot   Activity Tolerance Treatment limited secondary to agitation   Patient Left in chair;with chair alarm set;with call bell/phone within reach;Other (comment) (in w/c with alarm activated and call bell in pts lap)   Nurse Communication Mobility status        Time: 1610-9604 OT Time Calculation (min): 11 min  Charges: OT General Charges $OT Visit: 1 Visit OT Treatments $Self Care/Home Management : 8-22 mins Lenor Derrick., COTA/L Acute Rehabilitation Services [phone]   Barron Schmid 11/27/2023, 2:53 PM

## 2023-11-28 DIAGNOSIS — E11628 Type 2 diabetes mellitus with other skin complications: Secondary | ICD-10-CM | POA: Diagnosis not present

## 2023-11-28 DIAGNOSIS — L089 Local infection of the skin and subcutaneous tissue, unspecified: Secondary | ICD-10-CM | POA: Diagnosis not present

## 2023-11-28 LAB — CULTURE, BLOOD (ROUTINE X 2)
Culture: NO GROWTH
Culture: NO GROWTH
Special Requests: ADEQUATE

## 2023-11-28 LAB — CBC
HCT: 37.5 % — ABNORMAL LOW (ref 39.0–52.0)
Hemoglobin: 12.7 g/dL — ABNORMAL LOW (ref 13.0–17.0)
MCH: 28.5 pg (ref 26.0–34.0)
MCHC: 33.9 g/dL (ref 30.0–36.0)
MCV: 84.3 fL (ref 80.0–100.0)
Platelets: 264 10*3/uL (ref 150–400)
RBC: 4.45 MIL/uL (ref 4.22–5.81)
RDW: 13.4 % (ref 11.5–15.5)
WBC: 7.2 10*3/uL (ref 4.0–10.5)
nRBC: 0 % (ref 0.0–0.2)

## 2023-11-28 LAB — BASIC METABOLIC PANEL WITH GFR
Anion gap: 9 (ref 5–15)
BUN: 28 mg/dL — ABNORMAL HIGH (ref 8–23)
CO2: 20 mmol/L — ABNORMAL LOW (ref 22–32)
Calcium: 8.9 mg/dL (ref 8.9–10.3)
Chloride: 107 mmol/L (ref 98–111)
Creatinine, Ser: 1.61 mg/dL — ABNORMAL HIGH (ref 0.61–1.24)
GFR, Estimated: 47 mL/min — ABNORMAL LOW (ref >60)
Glucose, Bld: 154 mg/dL — ABNORMAL HIGH (ref 70–99)
Potassium: 4.5 mmol/L (ref 3.5–5.1)
Sodium: 136 mmol/L (ref 135–145)

## 2023-11-28 LAB — GLUCOSE, CAPILLARY
Glucose-Capillary: 240 mg/dL — ABNORMAL HIGH (ref 70–99)
Glucose-Capillary: 196 mg/dL — ABNORMAL HIGH (ref 70–99)
Glucose-Capillary: 140 mg/dL — ABNORMAL HIGH (ref 70–99)

## 2023-11-28 MED ORDER — SULFAMETHOXAZOLE-TRIMETHOPRIM 800-160 MG PO TABS: 1.0000 | ORAL_TABLET | Freq: Two times a day (BID) | ORAL | Status: AC

## 2023-11-28 MED ORDER — ASPIRIN 81 MG PO TBEC
81.0000 mg | DELAYED_RELEASE_TABLET | Freq: Every day | ORAL | Status: AC
Start: 2023-11-29 — End: ?

## 2023-11-28 MED ORDER — CLOPIDOGREL BISULFATE 75 MG PO TABS: 75.0000 mg | ORAL_TABLET | Freq: Every day | ORAL | Status: DC

## 2023-11-28 MED ORDER — SENNOSIDES-DOCUSATE SODIUM 8.6-50 MG PO TABS: 1.0000 | ORAL_TABLET | Freq: Two times a day (BID) | ORAL | Status: DC

## 2023-11-28 MED ORDER — SEMGLEE (YFGN) 100 UNIT/ML ~~LOC~~ SOPN: 12.0000 [IU] | PEN_INJECTOR | Freq: Two times a day (BID) | SUBCUTANEOUS | Status: DC

## 2023-11-28 MED ORDER — MORPHINE SULFATE (PF) 2 MG/ML IV SOLN: 1.0000 mg | Freq: Three times a day (TID) | INTRAVENOUS | Status: DC | PRN

## 2023-11-28 MED ORDER — ENSURE ENLIVE PO LIQD: 237.0000 mL | Freq: Two times a day (BID) | ORAL | Status: DC

## 2023-11-28 MED ORDER — ATORVASTATIN CALCIUM 40 MG PO TABS: 40.0000 mg | ORAL_TABLET | Freq: Every day | ORAL | Status: DC

## 2023-11-28 MED ORDER — INSULIN GLARGINE-YFGN 100 UNIT/ML ~~LOC~~ SOLN
12.0000 [IU] | Freq: Two times a day (BID) | SUBCUTANEOUS | Status: DC
  Filled 2023-11-28: qty 0.12

## 2023-11-28 MED ORDER — LINEZOLID 600 MG PO TABS: 600.0000 mg | ORAL_TABLET | Freq: Two times a day (BID) | ORAL | Status: AC

## 2023-11-28 MED ORDER — INSULIN ASPART 100 UNIT/ML IJ SOLN
0.0000 [IU] | Freq: Every day | INTRAMUSCULAR | Status: DC
Start: 2023-11-28 — End: 2023-12-13

## 2023-11-28 MED ORDER — POLYETHYLENE GLYCOL 3350 17 G PO PACK: 17.0000 g | PACK | Freq: Every day | ORAL | Status: DC | PRN

## 2023-11-28 MED ORDER — ACETAMINOPHEN 500 MG PO TABS
1000.0000 mg | ORAL_TABLET | Freq: Three times a day (TID) | ORAL | Status: DC
  Administered 2023-11-28 (×2): 1000 mg via ORAL
  Filled 2023-11-28 (×2): qty 2

## 2023-11-28 MED ORDER — INSULIN ASPART 100 UNIT/ML IJ SOLN: 0.0000 [IU] | Freq: Three times a day (TID) | INTRAMUSCULAR | Status: DC

## 2023-11-28 MED ORDER — OXYCODONE HCL 5 MG PO TABS: 5.0000 mg | ORAL_TABLET | Freq: Four times a day (QID) | ORAL | 0 refills | Status: DC | PRN

## 2023-11-28 NOTE — TOC Transition Note (Addendum)
 Transition of Care [facility]) - Discharge Note   Patient Details  Name: Jeremiah Burns MRN: [id number] Date of Birth: September 09, 1955  Transition of Care [facility]) CM/SW Contact:  Angelique Chevalier A [place], LCSW Phone Number: 11/28/2023, 3:53 PM   Clinical Narrative:     Patient will DC to: [facility]  Anticipated DC date: 11/28/23  Family notified: Pt declined collateral contact  Transport by: Theodoro Grist ID: #[id number]    Approval Dates: 4/11-4/13   Per MD patient ready for DC to [facility]. RN, patient, patient's family, and facility notified of DC. Discharge Summary and FL2 sent to facility. RN to call report prior to discharge (104p, [phone]). DC packet on chart. Ambulance transport requested for patient.     CSW will sign off for now as social work intervention is no longer needed. Please consult [place] again if new needs arise.   Final next level of care: Skilled Nursing Facility Barriers to Discharge: Barriers Resolved   Patient Goals and CMS Choice            Discharge Placement              Patient chooses bed at: [facility] Patient to be transferred to facility by: [facility] Name of family member notified: Pt declined Patient and family notified of of transfer: 11/28/23  Discharge Plan and Services Additional resources added to the After Visit Summary for   In-house Referral: Clinical Social Work                                   Social Drivers of Health (SDOH) Interventions SDOH Screenings   Food Insecurity: No Food Insecurity (11/19/2023)  Housing: Low Risk  (11/19/2023)  Transportation Needs: No Transportation Needs (11/19/2023)  Utilities: At Risk (11/19/2023)  Financial Resource Strain: Not at Risk (08/07/2023)   [facility]  Physical Activity: At Risk (08/07/2023)   Received from [facility]  Social Connections: Moderately Integrated (11/19/2023)  Stress: Not at Risk (08/07/2023)   Received from [facility]  Tobacco Use: Low  Risk  (11/21/2023)     Readmission Risk Interventions     No data to display

## 2023-11-28 NOTE — Plan of Care (Signed)

## 2023-11-28 NOTE — Plan of Care (Signed)
  Problem: Education: Goal: Ability to describe self-care measures that may prevent or decrease complications (Diabetes Survival Skills Education) will improve Outcome: Adequate for Discharge Goal: Individualized Educational Video(s) Outcome: Adequate for Discharge   Problem: Coping: Goal: Ability to adjust to condition or change in health will improve Outcome: Adequate for Discharge   Problem: Fluid Volume: Goal: Ability to maintain a balanced intake and output will improve Outcome: Adequate for Discharge   Problem: Health Behavior/Discharge Planning: Goal: Ability to identify and utilize available resources and services will improve Outcome: Adequate for Discharge Goal: Ability to manage health-related needs will improve Outcome: Adequate for Discharge   Problem: Metabolic: Goal: Ability to maintain appropriate glucose levels will improve Outcome: Adequate for Discharge   Problem: Nutritional: Goal: Maintenance of adequate nutrition will improve Outcome: Adequate for Discharge Goal: Progress toward achieving an optimal weight will improve Outcome: Adequate for Discharge   Problem: Skin Integrity: Goal: Risk for impaired skin integrity will decrease Outcome: Adequate for Discharge   Problem: Tissue Perfusion: Goal: Adequacy of tissue perfusion will improve Outcome: Adequate for Discharge   Problem: Education: Goal: Knowledge of General Education information will improve Description: Including pain rating scale, medication(s)/side effects and non-pharmacologic comfort measures Outcome: Adequate for Discharge   Problem: Health Behavior/Discharge Planning: Goal: Ability to manage health-related needs will improve Outcome: Adequate for Discharge   Problem: Clinical Measurements: Goal: Ability to maintain clinical measurements within normal limits will improve Outcome: Adequate for Discharge Goal: Will remain free from infection Outcome: Adequate for Discharge Goal:  Diagnostic test results will improve Outcome: Adequate for Discharge Goal: Respiratory complications will improve Outcome: Adequate for Discharge Goal: Cardiovascular complication will be avoided Outcome: Adequate for Discharge   Problem: Activity: Goal: Risk for activity intolerance will decrease Outcome: Adequate for Discharge   Problem: Nutrition: Goal: Adequate nutrition will be maintained Outcome: Adequate for Discharge   Problem: Coping: Goal: Level of anxiety will decrease Outcome: Adequate for Discharge   Problem: Elimination: Goal: Will not experience complications related to bowel motility Outcome: Adequate for Discharge Goal: Will not experience complications related to urinary retention Outcome: Adequate for Discharge   Problem: Pain Managment: Goal: General experience of comfort will improve and/or be controlled Outcome: Adequate for Discharge   Problem: Safety: Goal: Ability to remain free from injury will improve Outcome: Adequate for Discharge   Problem: Skin Integrity: Goal: Risk for impaired skin integrity will decrease Outcome: Adequate for Discharge   Problem: Education: Goal: Understanding of CV disease, CV risk reduction, and recovery process will improve Outcome: Adequate for Discharge Goal: Individualized Educational Video(s) Outcome: Adequate for Discharge   Problem: Activity: Goal: Ability to return to baseline activity level will improve Outcome: Adequate for Discharge   Problem: Cardiovascular: Goal: Ability to achieve and maintain adequate cardiovascular perfusion will improve Outcome: Adequate for Discharge Goal: Vascular access site(s) Level 0-1 will be maintained Outcome: Adequate for Discharge   Problem: Health Behavior/Discharge Planning: Goal: Ability to safely manage health-related needs after discharge will improve Outcome: Adequate for Discharge

## 2023-11-28 NOTE — Discharge Summary (Signed)
 Physician Discharge Summary  Jeremiah Burns OZH:[id number] DOB: 04/09/1956 DOA: 11/18/2023  PCP: System, Provider Not In  Admit date: 11/18/2023 Discharge date: 11/28/2023  Admitted From: Home Discharge disposition: SNF  Recommendations at discharge:  Continue antibiotics to complete 3 weeks course Zyvox 600 mg twice daily and Bactrim 800/160 mg twice daily on 12/12/2023   Brief narrative: Jeremiah Burns is a 68 y.o. male with PMH significant for DM2, HLD, PAD, peripheral neuropathy, chronic diabetic foot infection. 4/1, patient was seen at podiatry office, noted to have diabetic foot infection versus osteomyelitis, sent to ED for further evaluation management Admitted to [facility] Seen by podiatry and vascular surgery 4/2, MRI of the foot was negative for osteomyelitis but podiatry had clinical concerns 4/3, underwent angioplasty 4/4, underwent I&D, tibial sesamoidectomy, bone biopsy with placement of Integra allograft and antibiotic beads by Dr. Annamary Rummage  IntraOp culture showed Staphylococcus simulans, Moderate Diphtheroids, rare Proteus species. ID was consulted  Subjective: Patient was seen and examined this morning.  Pleasant elderly Caucasian male.  Lying on bed.  Not in distress.  No new symptoms. 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 Per ID recommendation, patient is currently on Zyvox 600 mg twice daily and Bactrim  800/160 mg twice daily for a total of 3 weeks postsurgery, EOT 12/12/2023 Recommendations: NWB in postop shoe to LLE, to leave the dressing clean dry and intact until follow-up in the office Pain control with scheduled Tylenol, as needed oxycodone.  Minimize use of IV opioids No fever.  WBC count normal. Recent Labs  Lab 11/22/23 0325 11/24/23 0347 11/28/23 1035  WBC 8.3 6.5 7.2   Blood culture positive for Sphingomonas Paucimobilis  Grew in 1 out of 4 bottles.  Likely contaminant per ID  PAD, HLD L lower extremity critical limb ischemia S/p angioplasty of left posterior tibial artery -4/3 Continue aspirin, Plavix, Lipitor   Type 2 diabetes mellitus Peripheral neuropathy A1c 8.9 on 11/19/2023 PTA meds-Lantus 50 units twice daily, Jardiance 25 mg daily, Actos 15 mg daily In the hospital, patient is using basal and bolus insulin regimen. Based on current requirement, will discharge on Semglee 12 units twice daily.  Resume Actos and Jardiance at discharge.  Continue SSI with Accu-Cheks. Recent Labs  Lab 11/27/23 1635 11/27/23 1927 11/28/23 0426 11/28/23 0812 11/28/23 1137  GLUCAP 162* 147* 240* 196* 140*   AKI on CKD 2 Creatinine improved back to baseline Recent Labs    11/18/23 1823 11/19/23 0428 11/20/23 0713 11/21/23 0359 11/22/23 0325 11/23/23 0326 11/24/23 0347 11/26/23 0600 11/28/23 1035  BUN 47* 50* 33* 26* 27* 24* 21 21 28*  CREATININE 1.45* 1.60* 1.42* 1.37* 1.48* 1.50* 1.49* 1.32* 1.61*    Hypertension PTA meds- lisinopril 10 mg daily Resume at discharge.  Chronic constipation Scheduled Senokot and as needed MiraLAX.  Anxiety/depression Elavil nightly, Seroquel nightly Continue Seroquel  Impaired mobility Seen by PT.  SNF recommended.  Goals of care   Code Status: Full Code   Diet:  Diet Order  Diet - low sodium heart healthy           Diet Carb Modified           Diet regular Room service appropriate? Yes; Fluid consistency:  Thin  Diet effective now                   Nutritional status:  Body mass index is 29.02 kg/m.  Nutrition Problem: Increased nutrient needs Etiology: wound healing Signs/Symptoms: estimated needs  Wounds:  - Wound / Incision (Open or Dehisced) 11/19/23 Diabetic ulcer Foot Left;Posterior (Active)  Date First Assessed/Time First Assessed: 11/19/23 1127   Wound Type: Diabetic ulcer  Location: Foot  Location Orientation: Left;Posterior  Present on Admission: Yes    Assessments 11/19/2023 11:27 AM 11/25/2023  7:29 PM  Dressing Type Gauze (Comment);Compression wrap --  Dressing Changed Changed --  Dressing Status Old drainage Clean, Dry, Intact  Site / Wound Assessment Black;Red;Yellow Dressing in place / Unable to assess  Wound Length (cm) 3 cm --  Wound Width (cm) 3 cm --  Wound Depth (cm) 0 cm --  Wound Volume (cm^3) 0 cm^3 --  Wound Surface Area (cm^2) 9 cm^2 --  Closure -- None  Drainage Amount Minimal --  Treatment Cleansed --     No associated orders.     Incision (Closed) 11/21/23 Foot Anterior;Left (Active)  Date First Assessed/Time First Assessed: 11/21/23 (c) 1308   Location: Foot  Location Orientation: Anterior;Left    Assessments 11/21/2023  1:09 PM 11/27/2023  8:04 AM  Dressing Type Other (Comment) Compression wrap  Dressing Clean, Dry, Intact Clean  Dressing Change Frequency -- Daily  Site / Wound Assessment Dressing in place / Unable to assess Dressing in place / Unable to assess  Drainage Amount None --     No associated orders.    Discharge Exam:   Vitals:   11/27/23 1925 11/28/23 0423 11/28/23 0822 11/28/23 0953  BP: (!) 149/76 122/61 137/78   Pulse: 89 85 78   Resp: 18 18 18    Temp: 97.9 F (36.6 C) 97.8 F (36.6 C)  98.1 F (36.7 C)  TempSrc: Oral Oral Oral Oral  SpO2: 100% 94%    Weight:      Height:        Body mass index is 29.02 kg/m.  General exam: Pleasant, elderly Caucasian male.  Lying on bed.  Not in distress. Skin: No rashes,  lesions or ulcers. HEENT: Atraumatic, normocephalic, no obvious bleeding Lungs: Clear to auscultation bilaterally,  CVS: S1, S2, no murmur,   GI/Abd: Soft, nontender, nondistended, bowel sound present,   CNS: Alert, awake, oriented to place and person Psychiatry: Mood appropriate,  Extremities: No pedal edema, no calf tenderness, left foot on bandage.  Follow ups:    Follow-up Information     Abigail Devries. Schedule an appointment as soon as possible for a visit.          Veryl Speak, FNP Follow up.   Specialty: Infectious Diseases Why: 12/12/2023 at 9:15 AM.  Please call reschedule if you are not able to make this appointment. Contact information: [address] [place] [address] [phone]                 Discharge Instructions:   Discharge Instructions     Call MD for:  difficulty breathing, headache or visual disturbances   Complete by: As directed    Call MD for:  extreme fatigue   Complete by:  As directed    Call MD for:  hives   Complete by: As directed    Call MD for:  persistant dizziness or light-headedness   Complete by: As directed    Call MD for:  persistant nausea and vomiting   Complete by: As directed    Call MD for:  severe uncontrolled pain   Complete by: As directed    Call MD for:  temperature >100.4   Complete by: As directed    Diet - low sodium heart healthy   Complete by: As directed    Diet Carb Modified   Complete by: As directed    Discharge instructions   Complete by: As directed    Recommendations at discharge:   Continue antibiotics to complete 3 weeks course Zyvox 600 mg twice daily and Bactrim 800/160 mg twice daily on 12/12/2023  Discharge instructions for diabetes mellitus: Check blood sugar 3 times a day and bedtime at home. If blood sugar running above 200 or less than 70 please call your MD to adjust insulin. If you notice signs and symptoms of hypoglycemia (low blood sugar) like  jitteriness, confusion, thirst, tremor and sweating, please check blood sugar, drink sugary drink/biscuits/sweets to increase sugar level and call MD or return to ER.      PDMP reviewed this encounter.   Opioid taper instructions: It is important to wean off of your opioid medication as soon as possible. If you do not need pain medication after your surgery it is ok to stop day one. Opioids include: Codeine, Hydrocodone(Norco, Vicodin), Oxycodone(Percocet, oxycontin) and hydromorphone amongst others.  Long term and even short term use of opiods can cause: Increased pain response Dependence Constipation Depression Respiratory depression And more.  Withdrawal symptoms can include Flu like symptoms Nausea, vomiting And more Techniques to manage these symptoms Hydrate well Eat regular healthy meals Stay active Use relaxation techniques(deep breathing, meditating, yoga) Do Not substitute Alcohol to help with tapering If you have been on opioids for less than two weeks and do not have pain than it is ok to stop all together.  Plan to wean off of opioids This plan should start within one week post op of your joint replacement. Maintain the same interval or time between taking each dose and first decrease the dose.  Cut the total daily intake of opioids by one tablet each day Next start to increase the time between doses. The last dose that should be eliminated is the evening dose.        General discharge instructions: Follow with Primary MD System, Provider Not In in 7 days  Please request your PCP  to go over your hospital tests, procedures, radiology results at the follow up. Please get your medicines reviewed and adjusted.  Your PCP may decide to repeat certain labs or tests as needed. Do not drive, operate heavy machinery, perform activities at heights, swimming or participation in water activities or provide baby sitting services if your were admitted for syncope or  siezures until you have seen by Primary MD or a Neurologist and advised to do so again. [place] Controlled Substance Reporting System database was reviewed. Do not drive, operate heavy machinery, perform activities at heights, swim, participate in water activities or provide baby-sitting services while on medications for pain, sleep and mood until your outpatient physician has reevaluated you and advised to do so again.  You are strongly recommended to comply with the dose, frequency and duration of prescribed medications. Activity: As tolerated  with Full fall precautions use walker/cane & assistance as needed Avoid using any recreational substances like cigarette, tobacco, alcohol, or non-prescribed drug. If you experience worsening of your admission symptoms, develop shortness of breath, life threatening emergency, suicidal or homicidal thoughts you must seek medical attention immediately by calling 911 or calling your MD immediately  if symptoms less severe. You must read complete instructions/literature along with all the possible adverse reactions/side effects for all the medicines you take and that have been prescribed to you. Take any new medicine only after you have completely understood and accepted all the possible adverse reactions/side effects.  Wear Seat belts while driving. You were cared for by a hospitalist during your hospital stay. If you have any questions about your discharge medications or the care you received while you were in the hospital after you are discharged, you can call the unit and ask to speak with the hospitalist or the covering physician. Once you are discharged, your primary care physician will handle any further medical issues. Please note that NO REFILLS for any discharge medications will be authorized once you are discharged, as it is imperative that you return to your primary care physician (or establish a relationship with a primary care physician if you do not  have one).   Discharge wound care:   Complete by: As directed    Increase activity slowly   Complete by: As directed        Discharge Medications:   Allergies as of 11/28/2023   No Known Allergies      Medication List     STOP taking these medications    amitriptyline 50 MG tablet Commonly known as: ELAVIL   insulin lispro 100 UNIT/ML injection Commonly known as: HUMALOG   Lantus SoloStar 100 UNIT/ML Solostar Pen Generic drug: insulin glargine       TAKE these medications    aspirin EC 81 MG tablet Take 1 tablet (81 mg total) by mouth daily. Swallow whole. Start taking on: November 29, 2023   atorvastatin 40 MG tablet Commonly known as: LIPITOR Take 1 tablet (40 mg total) by mouth daily. Start taking on: November 29, 2023   clopidogrel 75 MG tablet Commonly known as: PLAVIX Take 1 tablet (75 mg total) by mouth daily with breakfast. Start taking on: November 29, 2023   empagliflozin 25 MG Tabs tablet Commonly known as: JARDIANCE Take 1 tablet by mouth daily.   feeding supplement Liqd Take 237 mLs by mouth 2 (two) times daily between meals.   insulin aspart 100 UNIT/ML injection Commonly known as: novoLOG Inject 0-5 Units into the skin at bedtime.   insulin aspart 100 UNIT/ML injection Commonly known as: novoLOG Inject 0-9 Units into the skin 3 (three) times daily with meals.   linezolid 600 MG tablet Commonly known as: ZYVOX Take 1 tablet (600 mg total) by mouth every 12 (twelve) hours for 14 days.   lisinopril 10 MG tablet Commonly known as: ZESTRIL Take 1 tablet by mouth daily.   oxyCODONE 5 MG immediate release tablet Commonly known as: Oxy IR/ROXICODONE Take 1 tablet (5 mg total) by mouth every 6 (six) hours as needed for moderate pain (pain score 4-6).   pioglitazone 15 MG tablet Commonly known as: ACTOS Take 1 tablet by mouth daily.   polyethylene glycol 17 g packet Commonly known as: MIRALAX / GLYCOLAX Take 17 g by mouth daily as needed.    QUEtiapine 50 MG tablet Commonly known as: SEROQUEL Take 50 mg by mouth at  bedtime.   Semglee (yfgn) 100 UNIT/ML Pen Generic drug: insulin glargine-yfgn Inject 12 Units into the skin 2 (two) times daily. What changed: how much to take   senna-docusate 8.6-50 MG tablet Commonly known as: Senokot-S Take 1 tablet by mouth 2 (two) times daily.   sulfamethoxazole-trimethoprim 800-160 MG tablet Commonly known as: BACTRIM DS Take 1 tablet by mouth every 12 (twelve) hours for 14 days.   Tremfya 100 MG/ML pen Generic drug: guselkumab               Discharge Care Instructions  (From admission, onward)           Start     Ordered   11/28/23 0000  Discharge wound care:        11/28/23 1249             The results of significant diagnostics from this hospitalization (including imaging, microbiology, ancillary and laboratory) are listed below for reference.    Procedures and Diagnostic Studies:   VAS [place] ABI WITH/WO TBI Result Date: 11/19/2023  LOWER EXTREMITY DOPPLER STUDY Patient Name:  Jeremiah Burns  Date of Exam:   11/19/2023 Medical Rec #: [id number]         Accession #:    [id number] Date of Birth: Aug 26, 1955         Patient Gender: M Patient Age:   19 years Exam Location:  [facility] Procedure:      VAS [place] ABI WITH/WO TBI Referring Phys: Ulyess Blossom RATHORE --------------------------------------------------------------------------------  Indications: Ulceration, and peripheral artery disease. High Risk Factors: Diabetes.  Comparison Study: No prior exam. Performing Technologist: Fernande Bras  Examination Guidelines: A complete evaluation includes at minimum, Doppler waveform signals and systolic blood pressure reading at the level of bilateral brachial, anterior tibial, and posterior tibial arteries, when vessel segments are accessible. Bilateral testing is considered an integral part of a complete examination. Photoelectric Plethysmograph (PPG) waveforms and  toe systolic pressure readings are included as required and additional duplex testing as needed. Limited examinations for reoccurring indications may be performed as noted.  ABI Findings: +---------+------------------+-----+---------+--------+ Right    Rt Pressure (mmHg)IndexWaveform Comment  +---------+------------------+-----+---------+--------+ Brachial 133                    triphasic         +---------+------------------+-----+---------+--------+ PTA      255               1.88 biphasic          +---------+------------------+-----+---------+--------+ DP       117               0.86 biphasic          +---------+------------------+-----+---------+--------+ Great Toe73                0.54 Abnormal          +---------+------------------+-----+---------+--------+ +---------+------------------+-----+----------+-------+ Left     Lt Pressure (mmHg)IndexWaveform  Comment +---------+------------------+-----+----------+-------+ Brachial 136                    triphasic         +---------+------------------+-----+----------+-------+ PTA      153               1.12 biphasic          +---------+------------------+-----+----------+-------+ DP       160               1.18 monophasic        +---------+------------------+-----+----------+-------+  Great Toe53                0.39 Abnormal          +---------+------------------+-----+----------+-------+ +-------+-----------+-----------+------------+------------+ ABI/TBIToday's ABIToday's TBIPrevious ABIPrevious TBI +-------+-----------+-----------+------------+------------+ Right  0.86       0.54                                +-------+-----------+-----------+------------+------------+ Left   1.18       0.39                                +-------+-----------+-----------+------------+------------+  Summary: Right: Resting right ankle-brachial index indicates mild right lower extremity arterial disease.  The right toe-brachial index is abnormal. Left: Resting left ankle-brachial index is within normal range. The left toe-brachial index is abnormal. *See table(s) above for measurements and observations.  Electronically signed by Coral Else MD on 11/19/2023 at 7:01:11 PM.    Final    MR FOOT LEFT W WO CONTRAST Result Date: 11/19/2023 CLINICAL DATA:  Plantar ulceration of left forefoot overlying the first MTP joint. EXAM: MRI OF THE LEFT FOREFOOT WITHOUT AND WITH CONTRAST TECHNIQUE: Multiplanar, multisequence MR imaging of the left forefoot. Was performed both before and after administration of intravenous contrast. CONTRAST:  8mL GADAVIST GADOBUTROL 1 MMOL/ML IV SOLN COMPARISON:  Left foot radiographs dated 11/18/2023. MRI of the left toes dated 04/11/2023. FINDINGS: Bones/Joint/Cartilage Plantar ulceration overlying the first MTP joint. There is marginal spurring of the underlying first MTP joint with similar erosion or chronic osteochondral lesion of the base of the first proximal phalanx, slightly increased articular surface irregularity with trace endosteal edema at the distal articular margin of the first metatarsal head and the proximal articular margin of the first proximal phalanx. There is trace fluid within the first MTP joint with mild enhancement. These findings could reflect mild progression of advanced degenerative arthropathy of the first MTP joint, however, septic arthritis can not be excluded. No definite evidence of underlying osteomyelitis. The remainder of the visualized bones demonstrate normal marrow signal. 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 Ligaments Lisfranc ligament is intact. Muscles and Tendons Thickening and increased signal is again noted involving the flexor hallucis longus tendon, most notably at the level of the hallux sesamoid. The remainder of the flexor and extensor tendons are intact. Intrinsic muscles of the foot demonstrate mild atrophy and diffuse edema.  Soft tissue Plantar ulceration overlying the first MTP joint with associated foci of susceptibility artifact, which may relate to interval debridement. There is surrounding soft tissue edema and enhancement, compatible with cellulitis. No discrete focal fluid collection identified. Subcutaneous edema extending along the dorsal mid to lateral forefoot. IMPRESSION: 1. 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 No definite evidence of osteomyelitis. 2. Plantar ulceration of the forefoot, overlying the first MTP joint, with findings concerning for surrounding cellulitis and associated foci of susceptibility artifact which could relate to interval debridement. No abscess identified. 3. Similar marked flexor hallucis longus tendinosis at the level of the lateral hallux sesamoid. 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 4. Mild atrophy and edema of the intrinsic musculature of the foot, myositis can not be excluded. Electronically Signed   By: Hart Robinsons M.D.   On: 11/19/2023 08:34   DG Foot Complete  Left Result Date: 11/18/2023 Please see detailed radiograph report in office note.    Labs:   Basic Metabolic Panel: Recent Labs  Lab 11/22/23 0325 11/23/23 0326 11/24/23 0347 11/26/23 0600 11/28/23 1035  NA 134* 134* 134* 133* 136  K 4.4 4.4 4.1 4.2 4.5  CL 106 107 105 103 107  CO2 19* 19* 21* 21* 20*  GLUCOSE 228* 181* 180* 209* 154*  BUN 27* 24* 21 21 28*  CREATININE 1.48* 1.50* 1.49* 1.32* 1.61*  CALCIUM 8.0* 8.0* 8.5* 8.5* 8.9   GFR Estimated Creatinine Clearance: 52.2 mL/min (A) (by C-G formula based on SCr of 1.61 mg/dL (H)). Liver Function Tests: No results for input(s): "AST", "ALT", "ALKPHOS", "BILITOT", "PROT", "ALBUMIN" in the last 168 hours. No results for input(s): "LIPASE", "AMYLASE" in the last 168 hours. No results for input(s):  "AMMONIA" in the last 168 hours. Coagulation profile No results for input(s): "INR", "PROTIME" in the last 168 hours.  CBC: Recent Labs  Lab 11/22/23 0325 11/24/23 0347 11/28/23 1035  WBC 8.3 6.5 7.2  HGB 11.9* 12.4* 12.7*  HCT 35.2* 36.4* 37.5*  MCV 84.2 83.7 84.3  PLT 221 230 264   Cardiac Enzymes: No results for input(s): "CKTOTAL", "CKMB", "CKMBINDEX", "TROPONINI" in the last 168 hours. BNP: Invalid input(s): "POCBNP" CBG: Recent Labs  Lab 11/27/23 1635 11/27/23 1927 11/28/23 0426 11/28/23 0812 11/28/23 1137  GLUCAP 162* 147* 240* 196* 140*   D-Dimer No results for input(s): "DDIMER" in the last 72 hours. Hgb A1c No results for input(s): "HGBA1C" in the last 72 hours. Lipid Profile No results for input(s): "CHOL", "HDL", "LDLCALC", "TRIG", "CHOLHDL", "LDLDIRECT" in the last 72 hours. Thyroid function studies No results for input(s): "TSH", "T4TOTAL", "T3FREE", "THYROIDAB" in the last 72 hours.  Invalid input(s): "FREET3" Anemia work up No results for input(s): "VITAMINB12", "FOLATE", "FERRITIN", "TIBC", "IRON", "RETICCTPCT" in the last 72 hours. Microbiology Recent Results (from the past 240 hours)  Blood culture (routine x 2)     Status: Abnormal (Preliminary result)   Collection Time: 11/19/23 12:34 AM   Specimen: BLOOD RIGHT ARM  Result Value Ref Range Status   Specimen Description BLOOD RIGHT ARM  Final   Special Requests   Final    BOTTLES DRAWN AEROBIC AND ANAEROBIC Blood Culture adequate volume   Culture  Setup Time   Final    GRAM NEGATIVE RODS AEROBIC BOTTLE ONLY CRITICAL RESULT CALLED TO, READ BACK BY AND VERIFIED WITH: PHARMD EClinton Sawyer [id number] @2122  FH    Culture (A)  Final    SPHINGOMONAS PAUCIMOBILIS Sent to [facility] for further susceptibility testing. Performed at [facility], [address], [place], [place] [address]    Report Status PENDING  Incomplete  Blood culture (routine x 2)     Status: None   Collection Time: 11/19/23  12:34 AM   Specimen: BLOOD LEFT ARM  Result Value Ref Range Status   Specimen Description BLOOD LEFT ARM  Final   Special Requests   Final    BOTTLES DRAWN AEROBIC AND ANAEROBIC Blood Culture adequate volume   Culture   Final    NO GROWTH 5 DAYS Performed at [facility], [address] [place], [place] [address]    Report Status 11/24/2023 FINAL  Final  Blood Culture ID Panel (Reflexed)     Status: None   Collection Time: 11/19/23 12:34 AM  Result Value Ref Range Status   Enterococcus faecalis NOT DETECTED NOT DETECTED Final   Enterococcus Faecium NOT  DETECTED NOT DETECTED Final   Listeria monocytogenes NOT DETECTED NOT DETECTED Final   Staphylococcus species NOT DETECTED NOT DETECTED Final   Staphylococcus aureus (BCID) NOT DETECTED NOT DETECTED Final   Staphylococcus epidermidis NOT DETECTED NOT DETECTED Final   Staphylococcus lugdunensis NOT DETECTED NOT DETECTED Final   Streptococcus species NOT DETECTED NOT DETECTED Final   Streptococcus agalactiae NOT DETECTED NOT DETECTED Final   Streptococcus pneumoniae NOT DETECTED NOT DETECTED Final   Streptococcus pyogenes NOT DETECTED NOT DETECTED Final   A.calcoaceticus-baumannii NOT DETECTED NOT DETECTED Final   Bacteroides fragilis NOT DETECTED NOT DETECTED Final   Enterobacterales NOT DETECTED NOT DETECTED Final   Enterobacter cloacae complex NOT DETECTED NOT DETECTED Final   Escherichia coli NOT DETECTED NOT DETECTED Final   Klebsiella aerogenes NOT DETECTED NOT DETECTED Final   Klebsiella oxytoca NOT DETECTED NOT DETECTED Final   Klebsiella pneumoniae NOT DETECTED NOT DETECTED Final   Proteus species NOT DETECTED NOT DETECTED Final   Salmonella species NOT DETECTED NOT DETECTED Final   Serratia marcescens NOT DETECTED NOT DETECTED Final   Haemophilus influenzae NOT DETECTED NOT DETECTED Final   Neisseria meningitidis NOT DETECTED NOT DETECTED Final   Pseudomonas aeruginosa NOT DETECTED NOT DETECTED Final    Stenotrophomonas maltophilia NOT DETECTED NOT DETECTED Final   Candida albicans NOT DETECTED NOT DETECTED Final   Candida auris NOT DETECTED NOT DETECTED Final   Candida glabrata NOT DETECTED NOT DETECTED Final   Candida krusei NOT DETECTED NOT DETECTED Final   Candida parapsilosis NOT DETECTED NOT DETECTED Final   Candida tropicalis NOT DETECTED NOT DETECTED Final   Cryptococcus neoformans/gattii NOT DETECTED NOT DETECTED Final    Comment: Performed at [facility], [address], [place], [place] [address]  Surgical pcr screen     Status: None   Collection Time: 11/21/23  6:27 AM   Specimen: Nasal Mucosa; Nasal Swab  Result Value Ref Range Status   MRSA, PCR NEGATIVE NEGATIVE Final   Staphylococcus aureus NEGATIVE NEGATIVE Final    Comment: (NOTE) The Xpert SA Assay (FDA approved for NASAL specimens in patients 31 years of age and older), is one component of a comprehensive surveillance program. It is not intended to diagnose infection nor to guide or monitor treatment. Performed at [facility], [address] [place], [place] [address]   Aerobic/Anaerobic Culture w Gram Stain (surgical/deep wound)     Status: None   Collection Time: 11/21/23 12:33 PM   Specimen: Path Tissue  Result Value Ref Range Status   Specimen Description TISSUE  Final   Special Requests left foot ulcer  Final   Gram Stain NO WBC SEEN RARE GRAM POSITIVE COCCI IN PAIRS   Final   Culture   Final    ABUNDANT STAPHYLOCOCCUS SIMULANS MODERATE DIPHTHEROIDS(CORYNEBACTERIUM SPECIES) Standardized susceptibility testing for this organism is not available. RARE PROTEUS PENNERI NO ANAEROBES ISOLATED Performed at [facility], [address] [place], [place] [address]    Report Status 11/26/2023 FINAL  Final   Organism ID, Bacteria PROTEUS PENNERI  Final   Organism ID, Bacteria STAPHYLOCOCCUS SIMULANS  Final      Susceptibility   Proteus penneri - MIC*    AMPICILLIN >=32 RESISTANT  Resistant     CEFEPIME <=0.12 SENSITIVE Sensitive     CEFTAZIDIME <=1 SENSITIVE Sensitive     CEFTRIAXONE <=0.25 SENSITIVE Sensitive     CIPROFLOXACIN <=0.25 SENSITIVE Sensitive     GENTAMICIN <=1 SENSITIVE Sensitive  IMIPENEM 4 SENSITIVE Sensitive     TRIMETH/SULFA <=20 SENSITIVE Sensitive     AMPICILLIN/SULBACTAM 16 INTERMEDIATE Intermediate     PIP/TAZO <=4 SENSITIVE Sensitive ug/mL    * RARE PROTEUS PENNERI   Staphylococcus simulans - MIC*    CIPROFLOXACIN >=8 RESISTANT Resistant     ERYTHROMYCIN >=8 RESISTANT Resistant     GENTAMICIN <=0.5 SENSITIVE Sensitive     OXACILLIN >=4 RESISTANT Resistant     TETRACYCLINE <=1 SENSITIVE Sensitive     VANCOMYCIN <=0.5 SENSITIVE Sensitive     TRIMETH/SULFA <=10 SENSITIVE Sensitive     CLINDAMYCIN RESISTANT Resistant     RIFAMPIN <=0.5 SENSITIVE Sensitive     Inducible Clindamycin POSITIVE Resistant     * ABUNDANT STAPHYLOCOCCUS SIMULANS  Culture, blood (Routine X 2) w Reflex to ID Panel     Status: None   Collection Time: 11/23/23  4:39 PM   Specimen: BLOOD  Result Value Ref Range Status   Specimen Description BLOOD BLOOD LEFT ARM  Final   Special Requests   Final    AEROBIC BOTTLE ONLY Blood Culture results may not be optimal due to an inadequate volume of blood received in culture bottles   Culture   Final    NO GROWTH 5 DAYS Performed at [facility], [address], [place], [place] [address]    Report Status 11/28/2023 FINAL  Final  Culture, blood (Routine X 2) w Reflex to ID Panel     Status: None   Collection Time: 11/23/23  4:40 PM   Specimen: BLOOD  Result Value Ref Range Status   Specimen Description BLOOD BLOOD LEFT ARM  Final   Special Requests   Final    AEROBIC BOTTLE ONLY Blood Culture results may not be optimal due to an inadequate volume of blood received in culture bottles   Culture   Final    NO GROWTH 5 DAYS Performed at [facility], [address], [place], [place] [address]    Report Status  11/28/2023 FINAL  Final  Organism ID, Bacteria     Status: None (Preliminary result)   Collection Time: 11/25/23 11:42 AM  Result Value Ref Range Status   Organism ID, Bacteria CANPRO  Final    Comment: (NOTE) Test not performed. Test cancelled by Healthcare provider after order was submitted to [facility]. Test cancelled per Lisbeth Renshaw 11-27-2023 at 12:05 AM. Test order confirmed by Magda Bernheim . Please refer to spec # [id number] for susceptiblities. Performed At: [facility] [address] [place], [place] [id number] Jolene Schimke MD OZ:[id number]    Source PENDING  Incomplete  Bacterial organism reflex     Status: None   Collection Time: 11/25/23 11:42 AM  Result Value Ref Range Status   Bacterial result 1 Comment  Final    Comment: (NOTE) Specimen has been received and testing has been initiated. Performed At: [facility] [address] [place], [place] [id number] Jolene Schimke MD IE:[id number]     Time coordinating discharge: 45 minutes  Signed: Barrett Holthaus  [facility] 11/28/2023, 12:50 PM

## 2023-12-01 ENCOUNTER — Ambulatory Visit: Admitting: Podiatry

## 2023-12-01 ENCOUNTER — Ambulatory Visit (INDEPENDENT_AMBULATORY_CARE_PROVIDER_SITE_OTHER): Admitting: Podiatry

## 2023-12-01 ENCOUNTER — Encounter: Payer: Self-pay | Admitting: Podiatry

## 2023-12-01 DIAGNOSIS — E11621 Type 2 diabetes mellitus with foot ulcer: Secondary | ICD-10-CM

## 2023-12-01 DIAGNOSIS — M86172 Other acute osteomyelitis, left ankle and foot: Secondary | ICD-10-CM

## 2023-12-01 DIAGNOSIS — L97424 Non-pressure chronic ulcer of left heel and midfoot with necrosis of bone: Secondary | ICD-10-CM

## 2023-12-01 LAB — SUSCEPTIBILITY RESULT

## 2023-12-01 LAB — SUSCEPTIBILITY, AER + ANAEROB

## 2023-12-01 LAB — ORGANISM ID, BACTERIA

## 2023-12-01 LAB — MISC LABCORP TEST (SEND OUT)
LabCorp test name: 8664
Labcorp test code: 8664

## 2023-12-05 NOTE — Progress Notes (Signed)
 Subjective: Chief Complaint  Patient presents with   Routine Post Op    RM#11 Left foot post op follow up patient states is in a lot of pain.   Procedure: I&D ulcer, graft application left foot with tibial sesamoidectomy and bone biopsy DOS 11/21/2023  68 year old male presents the office today for foot evaluation after undergoing surgery with Dr. Gabrielle Joiner.  States he still having discomfort but does not appear to be in any obvious pain today.  He states he is still taking antibiotics as directed.  No fevers or chills.   Objective: AAO x3, NAD DP/PT pulses palpable bilaterally, CRT less than 3 seconds Plantar aspect left foot graft intact with staples securing the graft.  There is no surrounding erythema, ascending cellulitis.  No drainage or pus or any obvious signs of infection noted today. No pain with calf compression, swelling, warmth, erythema  Assessment: Status post left foot surgery  Plan: -All treatment options discussed with the patient including all alternatives, risks, complications.  -Graft appears to be integrating there is no obvious signs of infection.  Nonadherent dressing was applied followed by dressing.  Orders written for them to change the dressing at the facility as well as she is currently in rehab with a similar dressing.  NWB.  -Complete course of antibiotics. -Monitor for any clinical signs or symptoms of infection and directed to call the office immediately should any occur or go to the ER. -Patient encouraged to call the office with any questions, concerns, change in symptoms.    Charity Conch DPM

## 2023-12-09 ENCOUNTER — Ambulatory Visit (INDEPENDENT_AMBULATORY_CARE_PROVIDER_SITE_OTHER): Admitting: Podiatry

## 2023-12-09 DIAGNOSIS — E1165 Type 2 diabetes mellitus with hyperglycemia: Secondary | ICD-10-CM

## 2023-12-09 DIAGNOSIS — L97424 Non-pressure chronic ulcer of left heel and midfoot with necrosis of bone: Secondary | ICD-10-CM

## 2023-12-09 DIAGNOSIS — E11621 Type 2 diabetes mellitus with foot ulcer: Secondary | ICD-10-CM

## 2023-12-09 DIAGNOSIS — E1140 Type 2 diabetes mellitus with diabetic neuropathy, unspecified: Secondary | ICD-10-CM

## 2023-12-09 DIAGNOSIS — M86172 Other acute osteomyelitis, left ankle and foot: Secondary | ICD-10-CM

## 2023-12-09 NOTE — Progress Notes (Signed)
  Subjective:  Patient ID: Jeremiah Burns, male    DOB: [birth year],  MRN: [id number]  Chief Complaint  Patient presents with   Routine Post Op    POV#2: Diabetic ulcer of left midfoot associated with type 2 diabetes mellitus, with necrosis of bone     DOS: 11/21/23 Procedure:  1.  Irrigation and debridement of ulceration to bone level with prep for graft, left foot 2.  Tibial sesamoidectomy, left foot 3.  Bone biopsy via trocar open superficial proximal phalanx and first metatarsal head, left foot 4.  Application of dissolvable antibiotic beads, left foot 5.  Application Integra bilayer dermal allograft, 3 x 2.5 cm, left foot    68 y.o. male seen for post op check.   He is now approximately 2 and half weeks status post the above procedures.  Gets around in a wheelchair.  He reports some pain at the surgical site.  He is still at a nursing facility where they are changing the dressing every 2 to 3 days.  Review of Systems: Negative except as noted in the HPI. Denies N/V/F/Ch.   Objective:   There were no vitals filed for this visit.  There is no height or weight on file to calculate BMI. Constitutional Well developed. Well nourished.  Vascular Foot warm and well perfused. Capillary refill normal to all digits.   No calf pain with palpation  Neurologic Normal speech. Oriented to person, place, and time. Epicritic sensation diminished  Dermatologic Integra graft well adhered and integrating.  There is mild necrotic tissue surrounding the wound however improved from prior wound bed appears to have healthy granular tissue and fill with some antibiotic beads as well.  Overall no evidence of residual infection at this time   Orthopedic: S/p I&D left plantar 1st MPJ   Radiographs:  Tibial sesamoidectomy and antibiotic bead placement left foot.  Pathology:  A. BONE, LEFT FOOT TIBIAL, RESECTION:  Benign bone with attached connective tissue.  Negative for inflammation and  malignancy.   B. BONE, PROXIMAL PHALANX, BIOPSY:  Benign bone with focal fibrosis and reactive bone suggestive of healed osteomyelitis.  Negative for inflammation and malignancy.   C. BONE, FIRST METATARSAL, BIOPSY:  Benign bone and cartilage.  Negative for inflammation and malignancy.   Micro: S. 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   2.5 wks s/p above procedures, healing as expected post op.  -Overall improving with graft and wound care -Progressing well with graft intact and integrating well -remove the silicone layer and staples at this visit -Recommend ongoing wound care with antibiotic ointment applied to the ulceration and then covered with Xeroform 4 x 4 gauze dressing -XR: expected post op changes. - ABX per ID-getting 3 weeks of linezolid  and Bactrim , appreciate recs -WB Status: NWB in post op shoe to LLE -Medications/ABX: per ID - Continue wound care follow-up in 2 weeks        Maridee Shoemaker, DPM [facility] / [facility]

## 2023-12-12 ENCOUNTER — Inpatient Hospital Stay: Admitting: Family

## 2023-12-13 ENCOUNTER — Encounter (HOSPITAL_COMMUNITY): Payer: Self-pay | Admitting: Family Medicine

## 2023-12-13 ENCOUNTER — Emergency Department (HOSPITAL_COMMUNITY)

## 2023-12-13 ENCOUNTER — Inpatient Hospital Stay (HOSPITAL_COMMUNITY)
Admission: EM | Admit: 2023-12-13 | Discharge: 2023-12-17 | DRG: 369 | Disposition: A | Discharge status: Skilled Nursing Facility | Attending: Internal Medicine | Admitting: Internal Medicine

## 2023-12-13 ENCOUNTER — Other Ambulatory Visit: Payer: Self-pay

## 2023-12-13 DIAGNOSIS — I129 Hypertensive chronic kidney disease with stage 1 through stage 4 chronic kidney disease, or unspecified chronic kidney disease: Secondary | ICD-10-CM | POA: Diagnosis present

## 2023-12-13 DIAGNOSIS — N179 Acute kidney failure, unspecified: Secondary | ICD-10-CM | POA: Diagnosis present

## 2023-12-13 DIAGNOSIS — I739 Peripheral vascular disease, unspecified: Secondary | ICD-10-CM | POA: Diagnosis not present

## 2023-12-13 DIAGNOSIS — F32A Depression, unspecified: Secondary | ICD-10-CM | POA: Diagnosis present

## 2023-12-13 DIAGNOSIS — E785 Hyperlipidemia, unspecified: Secondary | ICD-10-CM | POA: Diagnosis present

## 2023-12-13 DIAGNOSIS — E860 Dehydration: Secondary | ICD-10-CM | POA: Diagnosis present

## 2023-12-13 DIAGNOSIS — N1832 Chronic kidney disease, stage 3b: Secondary | ICD-10-CM | POA: Diagnosis present

## 2023-12-13 DIAGNOSIS — I10 Essential (primary) hypertension: Secondary | ICD-10-CM | POA: Diagnosis present

## 2023-12-13 DIAGNOSIS — R1084 Generalized abdominal pain: Secondary | ICD-10-CM | POA: Diagnosis not present

## 2023-12-13 DIAGNOSIS — F419 Anxiety disorder, unspecified: Secondary | ICD-10-CM | POA: Diagnosis present

## 2023-12-13 DIAGNOSIS — E1165 Type 2 diabetes mellitus with hyperglycemia: Secondary | ICD-10-CM | POA: Diagnosis present

## 2023-12-13 DIAGNOSIS — K2970 Gastritis, unspecified, without bleeding: Secondary | ICD-10-CM | POA: Diagnosis present

## 2023-12-13 DIAGNOSIS — E1122 Type 2 diabetes mellitus with diabetic chronic kidney disease: Secondary | ICD-10-CM | POA: Diagnosis present

## 2023-12-13 DIAGNOSIS — K2950 Unspecified chronic gastritis without bleeding: Secondary | ICD-10-CM | POA: Diagnosis not present

## 2023-12-13 DIAGNOSIS — N189 Chronic kidney disease, unspecified: Secondary | ICD-10-CM | POA: Diagnosis present

## 2023-12-13 DIAGNOSIS — K920 Hematemesis: Principal | ICD-10-CM | POA: Diagnosis present

## 2023-12-13 DIAGNOSIS — K259 Gastric ulcer, unspecified as acute or chronic, without hemorrhage or perforation: Secondary | ICD-10-CM | POA: Diagnosis not present

## 2023-12-13 DIAGNOSIS — K2101 Gastro-esophageal reflux disease with esophagitis, with bleeding: Principal | ICD-10-CM | POA: Diagnosis present

## 2023-12-13 DIAGNOSIS — E119 Type 2 diabetes mellitus without complications: Secondary | ICD-10-CM

## 2023-12-13 DIAGNOSIS — N1831 Chronic kidney disease, stage 3a: Secondary | ICD-10-CM | POA: Diagnosis present

## 2023-12-13 DIAGNOSIS — Z8619 Personal history of other infectious and parasitic diseases: Secondary | ICD-10-CM | POA: Diagnosis not present

## 2023-12-13 DIAGNOSIS — E1151 Type 2 diabetes mellitus with diabetic peripheral angiopathy without gangrene: Secondary | ICD-10-CM | POA: Diagnosis present

## 2023-12-13 DIAGNOSIS — K209 Esophagitis, unspecified without bleeding: Secondary | ICD-10-CM | POA: Diagnosis not present

## 2023-12-13 DIAGNOSIS — Z79899 Other long term (current) drug therapy: Secondary | ICD-10-CM | POA: Diagnosis not present

## 2023-12-13 DIAGNOSIS — E1142 Type 2 diabetes mellitus with diabetic polyneuropathy: Secondary | ICD-10-CM | POA: Diagnosis present

## 2023-12-13 DIAGNOSIS — E872 Acidosis, unspecified: Secondary | ICD-10-CM | POA: Diagnosis present

## 2023-12-13 DIAGNOSIS — Z8739 Personal history of other diseases of the musculoskeletal system and connective tissue: Secondary | ICD-10-CM

## 2023-12-13 DIAGNOSIS — K2210 Ulcer of esophagus without bleeding: Secondary | ICD-10-CM | POA: Diagnosis present

## 2023-12-13 DIAGNOSIS — D62 Acute posthemorrhagic anemia: Secondary | ICD-10-CM | POA: Diagnosis present

## 2023-12-13 DIAGNOSIS — Z794 Long term (current) use of insulin: Secondary | ICD-10-CM | POA: Diagnosis not present

## 2023-12-13 DIAGNOSIS — R1030 Lower abdominal pain, unspecified: Secondary | ICD-10-CM | POA: Diagnosis present

## 2023-12-13 LAB — CBC WITH DIFFERENTIAL/PLATELET
Abs Immature Granulocytes: 0.03 10*3/uL (ref 0.00–0.07)
Basophils Absolute: 0.1 10*3/uL (ref 0.0–0.1)
Basophils Relative: 1 %
Eosinophils Absolute: 0 10*3/uL (ref 0.0–0.5)
Eosinophils Relative: 0 %
HCT: 45.2 % (ref 39.0–52.0)
Hemoglobin: 15.1 g/dL (ref 13.0–17.0)
Immature Granulocytes: 0 %
Lymphocytes Relative: 15 %
Lymphs Abs: 1.5 10*3/uL (ref 0.7–4.0)
MCH: 28.7 pg (ref 26.0–34.0)
MCHC: 33.4 g/dL (ref 30.0–36.0)
MCV: 85.8 fL (ref 80.0–100.0)
Monocytes Absolute: 0.5 10*3/uL (ref 0.1–1.0)
Monocytes Relative: 5 %
Neutro Abs: 8.1 10*3/uL — ABNORMAL HIGH (ref 1.7–7.7)
Neutrophils Relative %: 79 %
Platelets: 224 10*3/uL (ref 150–400)
RBC: 5.27 MIL/uL (ref 4.22–5.81)
RDW: 13.2 % (ref 11.5–15.5)
WBC: 10.2 10*3/uL (ref 4.0–10.5)
nRBC: 0 % (ref 0.0–0.2)

## 2023-12-13 LAB — COMPREHENSIVE METABOLIC PANEL WITH GFR
ALT: 32 U/L (ref 0–44)
AST: 31 U/L (ref 15–41)
Albumin: 4.3 g/dL (ref 3.5–5.0)
Alkaline Phosphatase: 42 U/L (ref 38–126)
Anion gap: 18 — ABNORMAL HIGH (ref 5–15)
BUN: 38 mg/dL — ABNORMAL HIGH (ref 8–23)
CO2: 19 mmol/L — ABNORMAL LOW (ref 22–32)
Calcium: 9.6 mg/dL (ref 8.9–10.3)
Chloride: 100 mmol/L (ref 98–111)
Creatinine, Ser: 2.38 mg/dL — ABNORMAL HIGH (ref 0.61–1.24)
GFR, Estimated: 29 mL/min — ABNORMAL LOW (ref >60)
Glucose, Bld: 163 mg/dL — ABNORMAL HIGH (ref 70–99)
Potassium: 4.8 mmol/L (ref 3.5–5.1)
Sodium: 137 mmol/L (ref 135–145)
Total Bilirubin: 0.9 mg/dL (ref 0.0–1.2)
Total Protein: 7.8 g/dL (ref 6.5–8.1)

## 2023-12-13 LAB — CBG MONITORING, ED: Glucose-Capillary: 163 mg/dL — ABNORMAL HIGH (ref 70–99)

## 2023-12-13 LAB — HEMOGLOBIN AND HEMATOCRIT, BLOOD
HCT: 39.8 % (ref 39.0–52.0)
HCT: 39 % (ref 39.0–52.0)
Hemoglobin: 13.4 g/dL (ref 13.0–17.0)
Hemoglobin: 13.2 g/dL (ref 13.0–17.0)

## 2023-12-13 LAB — GLUCOSE, CAPILLARY
Glucose-Capillary: 150 mg/dL — ABNORMAL HIGH (ref 70–99)
Glucose-Capillary: 143 mg/dL — ABNORMAL HIGH (ref 70–99)
Glucose-Capillary: 170 mg/dL — ABNORMAL HIGH (ref 70–99)

## 2023-12-13 LAB — I-STAT CHEM 8, ED
BUN: 43 mg/dL — ABNORMAL HIGH (ref 8–23)
Calcium, Ion: 1.19 mmol/L (ref 1.15–1.40)
Chloride: 104 mmol/L (ref 98–111)
Creatinine, Ser: 2.5 mg/dL — ABNORMAL HIGH (ref 0.61–1.24)
Glucose, Bld: 159 mg/dL — ABNORMAL HIGH (ref 70–99)
HCT: 44 % (ref 39.0–52.0)
Hemoglobin: 15 g/dL (ref 13.0–17.0)
Potassium: 4.7 mmol/L (ref 3.5–5.1)
Sodium: 137 mmol/L (ref 135–145)
TCO2: 22 mmol/L (ref 22–32)

## 2023-12-13 LAB — I-STAT CG4 LACTIC ACID, ED
Lactic Acid, Venous: 2.2 mmol/L (ref 0.5–1.9)
Lactic Acid, Venous: 1.7 mmol/L (ref 0.5–1.9)

## 2023-12-13 LAB — LIPASE, BLOOD: Lipase: 22 U/L (ref 11–51)

## 2023-12-13 LAB — OCCULT BLOOD GASTRIC / DUODENUM (SPECIMEN CUP): Occult Blood, Gastric: POSITIVE — AB

## 2023-12-13 MED ORDER — FENTANYL CITRATE PF 50 MCG/ML IJ SOSY
12.5000 ug | PREFILLED_SYRINGE | INTRAMUSCULAR | Status: DC | PRN
  Administered 2023-12-14: 12.5 ug via INTRAVENOUS
  Filled 2023-12-13: qty 1

## 2023-12-13 MED ORDER — ONDANSETRON HCL 4 MG/2ML IJ SOLN
4.0000 mg | Freq: Once | INTRAMUSCULAR | Status: AC
  Administered 2023-12-13: 4 mg via INTRAVENOUS
  Filled 2023-12-13: qty 2

## 2023-12-13 MED ORDER — SODIUM CHLORIDE 0.9 % IV SOLN: INTRAVENOUS | Status: AC

## 2023-12-13 MED ORDER — ONDANSETRON HCL 4 MG/2ML IJ SOLN
4.0000 mg | Freq: Four times a day (QID) | INTRAMUSCULAR | Status: DC | PRN
  Administered 2023-12-13 – 2023-12-16 (×3): 4 mg via INTRAVENOUS
  Filled 2023-12-13 (×2): qty 2

## 2023-12-13 MED ORDER — FENTANYL CITRATE PF 50 MCG/ML IJ SOSY
50.0000 ug | PREFILLED_SYRINGE | Freq: Once | INTRAMUSCULAR | Status: AC
  Administered 2023-12-13: 50 ug via INTRAVENOUS
  Filled 2023-12-13: qty 1

## 2023-12-13 MED ORDER — PIOGLITAZONE HCL 15 MG PO TABS
15.0000 mg | ORAL_TABLET | Freq: Every day | ORAL | Status: DC
  Administered 2023-12-13: 15 mg via ORAL
  Filled 2023-12-13 (×2): qty 1

## 2023-12-13 MED ORDER — SODIUM CHLORIDE 0.9 % IV BOLUS
1000.0000 mL | Freq: Once | INTRAVENOUS | Status: AC
  Administered 2023-12-13: 1000 mL via INTRAVENOUS

## 2023-12-13 MED ORDER — DOUBLE ANTIBIOTIC 500-10000 UNIT/GM EX OINT
TOPICAL_OINTMENT | CUTANEOUS | Status: DC
  Filled 2023-12-13: qty 28.4

## 2023-12-13 MED ORDER — METOCLOPRAMIDE HCL 5 MG/ML IJ SOLN
10.0000 mg | Freq: Once | INTRAMUSCULAR | Status: AC
  Administered 2023-12-13: 10 mg via INTRAVENOUS
  Filled 2023-12-13: qty 2

## 2023-12-13 MED ORDER — SUCRALFATE 1 GM/10ML PO SUSP
1.0000 g | Freq: Three times a day (TID) | ORAL | Status: DC
  Administered 2023-12-13 – 2023-12-17 (×7): 1 g via ORAL
  Filled 2023-12-13 (×8): qty 10

## 2023-12-13 MED ORDER — PANTOPRAZOLE SODIUM 40 MG IV SOLR
40.0000 mg | Freq: Once | INTRAVENOUS | Status: AC
  Administered 2023-12-13: 40 mg via INTRAVENOUS
  Filled 2023-12-13: qty 10

## 2023-12-13 MED ORDER — PANTOPRAZOLE SODIUM 40 MG IV SOLR
40.0000 mg | Freq: Two times a day (BID) | INTRAVENOUS | Status: DC
  Administered 2023-12-13 – 2023-12-16 (×8): 40 mg via INTRAVENOUS
  Filled 2023-12-13 (×8): qty 10

## 2023-12-13 MED ORDER — QUETIAPINE FUMARATE 25 MG PO TABS
50.0000 mg | ORAL_TABLET | Freq: Every morning | ORAL | Status: DC
  Administered 2023-12-13 – 2023-12-17 (×4): 50 mg via ORAL
  Filled 2023-12-13 (×4): qty 2

## 2023-12-13 MED ORDER — OXYCODONE HCL 5 MG PO TABS
5.0000 mg | ORAL_TABLET | Freq: Four times a day (QID) | ORAL | Status: DC | PRN
  Administered 2023-12-14 – 2023-12-17 (×4): 5 mg via ORAL
  Filled 2023-12-13 (×4): qty 1

## 2023-12-13 MED ORDER — INSULIN GLARGINE-YFGN 100 UNIT/ML ~~LOC~~ SOLN
12.0000 [IU] | Freq: Two times a day (BID) | SUBCUTANEOUS | Status: DC
  Administered 2023-12-13 (×2): 12 [IU] via SUBCUTANEOUS
  Filled 2023-12-13 (×5): qty 0.12

## 2023-12-13 MED ORDER — ATORVASTATIN CALCIUM 40 MG PO TABS
40.0000 mg | ORAL_TABLET | Freq: Every day | ORAL | Status: DC
  Administered 2023-12-13 – 2023-12-17 (×4): 40 mg via ORAL
  Filled 2023-12-13 (×4): qty 1

## 2023-12-13 MED ORDER — INSULIN ASPART 100 UNIT/ML IJ SOLN
0.0000 [IU] | Freq: Three times a day (TID) | INTRAMUSCULAR | Status: DC
Start: 2023-12-13 — End: 2023-12-14
  Administered 2023-12-13 – 2023-12-14 (×2): 1 [IU] via SUBCUTANEOUS

## 2023-12-13 NOTE — Plan of Care (Signed)
 Npo at midnight for procedure. Problem: Education: Goal: Knowledge of General Education information will improve Description: Including pain rating scale, medication(s)/side effects and non-pharmacologic comfort measures Outcome: Progressing   Problem: Health Behavior/Discharge Planning: Goal: Ability to manage health-related needs will improve Outcome: Progressing   Problem: Clinical Measurements: Goal: Ability to maintain clinical measurements within normal limits will improve Outcome: Progressing Goal: Will remain free from infection Outcome: Progressing Goal: Diagnostic test results will improve Outcome: Progressing Goal: Respiratory complications will improve Outcome: Progressing Goal: Cardiovascular complication will be avoided Outcome: Progressing   Problem: Activity: Goal: Risk for activity intolerance will decrease Outcome: Progressing   Problem: Nutrition: Goal: Adequate nutrition will be maintained Outcome: Progressing   Problem: Coping: Goal: Level of anxiety will decrease Outcome: Progressing   Problem: Elimination: Goal: Will not experience complications related to bowel motility Outcome: Progressing Goal: Will not experience complications related to urinary retention Outcome: Progressing   Problem: Pain Managment: Goal: General experience of comfort will improve and/or be controlled Outcome: Progressing   Problem: Safety: Goal: Ability to remain free from injury will improve Outcome: Progressing   Problem: Skin Integrity: Goal: Risk for impaired skin integrity will decrease Outcome: Progressing   Problem: Education: Goal: Ability to describe self-care measures that may prevent or decrease complications (Diabetes Survival Skills Education) will improve Outcome: Progressing Goal: Individualized Educational Video(s) Outcome: Progressing   Problem: Coping: Goal: Ability to adjust to condition or change in health will improve Outcome:  Progressing   Problem: Fluid Volume: Goal: Ability to maintain a balanced intake and output will improve Outcome: Progressing   Problem: Health Behavior/Discharge Planning: Goal: Ability to identify and utilize available resources and services will improve Outcome: Progressing Goal: Ability to manage health-related needs will improve Outcome: Progressing   Problem: Metabolic: Goal: Ability to maintain appropriate glucose levels will improve Outcome: Progressing   Problem: Nutritional: Goal: Maintenance of adequate nutrition will improve Outcome: Progressing Goal: Progress toward achieving an optimal weight will improve Outcome: Progressing   Problem: Skin Integrity: Goal: Risk for impaired skin integrity will decrease Outcome: Progressing   Problem: Tissue Perfusion: Goal: Adequacy of tissue perfusion will improve Outcome: Progressing

## 2023-12-13 NOTE — ED Triage Notes (Signed)
 Patient brought in by EMS from [facility] with c/o vomiting coffee ground emesis x3 days. Patient also c/o constipation for 6 days with constant lower abdomen pain. Patient has a history of constipation. Vital signs stable with EMS, they administered 4mg  of Zofran  via IV.

## 2023-12-13 NOTE — ED Provider Notes (Signed)
 [place] [facility]ERGENCY DEPARTMENT AT [facility] Provider Note   CSN: [id number] Arrival date & time: 12/13/23  0117     History  Chief Complaint  Patient presents with   Emesis   Abdominal Pain    Jeremiah Burns is a 68 y.o. male.  The history is provided by the patient and medical records.  Emesis Associated symptoms: abdominal pain   Abdominal Pain Associated symptoms: vomiting   Jeremiah Burns is a 68 y.o. male who presents to the Emergency Department complaining of vomiting.  He presents to the emergency department by EMS from [facility] health care for evaluation of 3 days of dark emesis with 6 days of constipation.  He reports associated generalized abdominal pain.  He does not know if he has a fever.  He is currently residing at [facility] care and uses a wheelchair for ambulation.  He was recently discharged from the hospital on April 11 following admission for diabetic foot infection.   Home Medications Prior to Admission medications   Medication Sig Start Date End Date Taking? Authorizing Provider  acetaminophen  (TYLENOL ) 500 MG tablet Take 1,000 mg by mouth in the morning and at bedtime. For 4 days   Yes [provider]  aspirin  EC 81 MG tablet Take 1 tablet (81 mg total) by mouth daily. Swallow whole. 11/29/23  Yes Dahal, Aminta Baldy, MD  atorvastatin  (LIPITOR) 40 MG tablet Take 1 tablet (40 mg total) by mouth daily. 11/29/23  Yes Dahal, Aminta Baldy, MD  clopidogrel  (PLAVIX ) 75 MG tablet Take 1 tablet (75 mg total) by mouth daily with breakfast. 11/29/23  Yes Dahal, Aminta Baldy, MD  empagliflozin (JARDIANCE) 25 MG TABS tablet Take 1 tablet by mouth daily. 09/05/23  Yes [provider]  feeding supplement (ENSURE ENLIVE / ENSURE PLUS) LIQD Take 237 mLs by mouth 2 (two) times daily between meals. 11/28/23  Yes Dahal, Aminta Baldy, MD  insulin  lispro (HUMALOG KWIKPEN) 100 UNIT/ML KwikPen Inject 0-12 Units into the skin 4 (four) times daily -  before meals and at  bedtime. Per sliding scale, if 71-110= 0,  111-150= 3,  151-200= 4,  201-250= 6,  251-300= 8,  301-350= 10,  351-400= 12   Yes [provider]  lisinopril (ZESTRIL) 10 MG tablet Take 1 tablet by mouth daily. 05/19/23  Yes [provider]  ondansetron  (ZOFRAN ) 4 MG tablet Take 4 mg by mouth every 8 (eight) hours as needed for nausea or vomiting.   Yes [provider]  oxyCODONE  (OXY IR/ROXICODONE ) 5 MG immediate release tablet Take 1 tablet (5 mg total) by mouth every 6 (six) hours as needed for moderate pain (pain score 4-6). 11/28/23  Yes Dahal, Aminta Baldy, MD  pioglitazone (ACTOS) 15 MG tablet Take 1 tablet by mouth daily. 11/06/23  Yes [provider]  QUEtiapine  (SEROQUEL ) 50 MG tablet Take 50 mg by mouth in the morning. 06/03/23  Yes [provider]  SEMGLEE , YFGN, 100 UNIT/ML Pen Inject 12 Units into the skin 2 (two) times daily. 11/28/23  Yes Dahal, Aminta Baldy, MD  senna-docusate (SENOKOT-S) 8.6-50 MG tablet Take 1 tablet by mouth 2 (two) times daily. 11/28/23  Yes Dahal, Aminta Baldy, MD  sulfamethoxazole -trimethoprim  (BACTRIM  DS) 800-160 MG tablet Take 1 tablet by mouth 2 (two) times daily. For 14 days   Yes [provider]  linezolid  (ZYVOX ) 600 MG tablet Take 600 mg by mouth 2 (two) times daily. Patient not taking: Reported on 12/13/2023    [provider]  polyethylene glycol (MIRALAX  / GLYCOLAX ) 17  g packet Take 17 g by mouth daily as needed. Patient not taking: Reported on 12/13/2023 11/28/23   Hoyt Macleod, MD  TREMFYA 100 MG/ML pen  10/20/23   [provider]      Allergies    Patient has no known allergies.    Review of Systems   Review of Systems  Gastrointestinal:  Positive for abdominal pain and vomiting.  All other systems reviewed and are negative.   Physical Exam Updated Vital Signs BP 126/79 (BP Location: Right Arm)   Pulse (!) 109   Temp 98.8 F (37.1 C) (Oral)   Resp 17   Ht 5\' 11"  (1.803 m)   Wt 81.6 kg    SpO2 99%   BMI 25.10 kg/m  Physical Exam Vitals and nursing note reviewed.  Constitutional:      Appearance: He is well-developed.  HENT:     Head: Normocephalic and atraumatic.  Cardiovascular:     Rate and Rhythm: Regular rhythm. Tachycardia present.  Pulmonary:     Effort: Pulmonary effort is normal. No respiratory distress.  Abdominal:     Palpations: Abdomen is soft.     Tenderness: There is no guarding or rebound.     Comments: Moderate generalized abdominal tenderness  Musculoskeletal:        General: No tenderness.  Skin:    General: Skin is warm and dry.  Neurological:     Mental Status: He is alert and oriented to person, place, and time.  Psychiatric:        Behavior: Behavior normal.     ED Results / Procedures / Treatments   Labs (all labs ordered are listed, but only abnormal results are displayed) Labs Reviewed  COMPREHENSIVE METABOLIC PANEL WITH GFR - Abnormal; Notable for the following components:      Result Value   CO2 19 (*)    Glucose, Bld 163 (*)    BUN 38 (*)    Creatinine, Ser 2.38 (*)    GFR, Estimated 29 (*)    Anion gap 18 (*)    All other components within normal limits  CBC WITH DIFFERENTIAL/PLATELET - Abnormal; Notable for the following components:   Neutro Abs 8.1 (*)    All other components within normal limits  OCCULT BLOOD GASTRIC / DUODENUM (SPECIMEN CUP) - Abnormal; Notable for the following components:   Occult Blood, Gastric POSITIVE (*)    All other components within normal limits  I-STAT CHEM 8, ED - Abnormal; Notable for the following components:   BUN 43 (*)    Creatinine, Ser 2.50 (*)    Glucose, Bld 159 (*)    All other components within normal limits  I-STAT CG4 LACTIC ACID, ED - Abnormal; Notable for the following components:   Lactic Acid, Venous 2.2 (*)    All other components within normal limits  CBG MONITORING, ED - Abnormal; Notable for the following components:   Glucose-Capillary 163 (*)    All other  components within normal limits  LIPASE, BLOOD  HEMOGLOBIN AND HEMATOCRIT, BLOOD  HEMOGLOBIN AND HEMATOCRIT, BLOOD  URINALYSIS, COMPLETE (UACMP) WITH MICROSCOPIC  SODIUM, URINE, RANDOM  CREATININE, URINE, RANDOM  I-STAT CG4 LACTIC ACID, ED    EKG EKG Interpretation Date/Time:  Saturday December 13 2023 01:21:33 EDT Ventricular Rate:  102 PR Interval:  160 QRS Duration:  100 QT Interval:  337 QTC Calculation: 439 R Axis:   88  Text Interpretation: Sinus tachycardia Borderline right axis deviation Low voltage, precordial leads Confirmed by Monique Ano,  Nellie Banas [phone]) on 12/13/2023 3:11:24 AM  Radiology CT ABDOMEN PELVIS WO CONTRAST Result Date: 12/13/2023 CLINICAL DATA:  Abdominal pain EXAM: CT ABDOMEN AND PELVIS WITHOUT CONTRAST TECHNIQUE: Multidetector CT imaging of the abdomen and pelvis was performed following the standard protocol without IV contrast. RADIATION DOSE REDUCTION: This exam was performed according to the departmental dose-optimization program which includes automated exposure control, adjustment of the mA and/or kV according to patient size and/or use of iterative reconstruction technique. COMPARISON:  08/22/2018 FINDINGS: Lower chest: Minimal dependent atelectasis in the bilateral lower lobes. Hepatobiliary: Unenhanced liver is unremarkable. Gallbladder is unremarkable. No intrahepatic or extrahepatic ductal dilatation. Pancreas: Mild parenchymal atrophy. Spleen: Within normal limits. Adrenals/Urinary Tract: Adrenal glands are within normal limits. Kidneys are within normal limits. No renal, ureteral, or bladder calculi. No hydronephrosis. Bladder is within normal limits. Stomach/Bowel: Mild wall thickening involving the mid/distal esophagus (image 3), nonspecific, correlate for esophagitis. Associated tiny hiatal hernia. No evidence of bowel obstruction. Normal appendix (image 51). No colonic wall thickening or inflammatory changes. Mild to moderate left colonic stool burden.  Vascular/Lymphatic: No evidence of abdominal aortic aneurysm. Atherosclerotic calcifications of the abdominal aorta and branch vessels. No suspicious abdominopelvic lymphadenopathy. Reproductive: Prostate is unremarkable. Other: No abdominopelvic ascites. Musculoskeletal: Degenerative changes of the visualized thoracolumbar spine. Grade 1 spondylolisthesis at L5-S1. IMPRESSION: Mild lower esophageal wall thickening, correlate for esophagitis. Otherwise negative CT abdomen/pelvis. Electronically Signed   By: Zadie Herter M.D.   On: 12/13/2023 02:46   DG Chest Port 1 View Result Date: 12/13/2023 CLINICAL DATA:  Shortness of breath, vomiting EXAM: PORTABLE CHEST 1 VIEW COMPARISON:  04/10/2023 FINDINGS: Lungs are clear.  No pleural effusion or pneumothorax. The heart is normal in size. IMPRESSION: No acute cardiopulmonary disease. Electronically Signed   By: Zadie Herter M.D.   On: 12/13/2023 01:55    Procedures Procedures   CRITICAL CARE Performed by: Kelsey Patricia   Total critical care time: 32 minutes  Critical care time was exclusive of separately billable procedures and treating other patients.  Critical care was necessary to treat or prevent imminent or life-threatening deterioration.  Critical care was time spent personally by me on the following activities: development of treatment plan with patient and/or surrogate as well as nursing, discussions with consultants, evaluation of patient's response to treatment, examination of patient, obtaining history from patient or surrogate, ordering and performing treatments and interventions, ordering and review of laboratory studies, ordering and review of radiographic studies, pulse oximetry and re-evaluation of patient's condition.  Medications Ordered in ED Medications  pantoprazole  (PROTONIX ) injection 40 mg (has no administration in time range)  0.9 %  sodium chloride  infusion ( Intravenous New Bag/Given 12/13/23 0416)  ondansetron   (ZOFRAN ) injection 4 mg (has no administration in time range)  fentaNYL  (SUBLIMAZE ) injection 12.5-50 mcg (has no administration in time range)  sodium chloride  0.9 % bolus 1,000 mL (0 mLs Intravenous Stopped 12/13/23 0414)  fentaNYL  (SUBLIMAZE ) injection 50 mcg (50 mcg Intravenous Given 12/13/23 0210)  ondansetron  (ZOFRAN ) injection 4 mg (4 mg Intravenous Given 12/13/23 0211)  pantoprazole  (PROTONIX ) injection 40 mg (40 mg Intravenous Given 12/13/23 0211)  metoCLOPramide (REGLAN) injection 10 mg (10 mg Intravenous Given 12/13/23 0411)  fentaNYL  (SUBLIMAZE ) injection 50 mcg (50 mcg Intravenous Given 12/13/23 6578)    ED Course/ Medical Decision Making/ A&P                                 Medical  Decision Making Amount and/or Complexity of Data Reviewed Labs: ordered. Radiology: ordered.  Risk Prescription drug management. Decision regarding hospitalization.   Patient with history of diabetes with recent admission to the hospital for diabetic foot infection and discharged to rehab facility currently on antibiotics here for evaluation of 3 days of dark emesis and abdominal pain with associated constipation.  Emesis at the bedside is coffee-ground in color, Hemoccult positive.  He is tachycardic.  With normal blood pressures.  He was treated with fentanyl  for pain, IV fluids, additional antiemetic.  Labs significant for AKI.  CBC with normal hemoglobin, actually improved when compared to recent hospitalization.  Suspect he has a degree of dehydration and hemoconcentration.  CT abdomen pelvis was obtained, which is negative for acute pathology.  He was treated with Protonix  for acute upper GI bleed as well as IV fluids for dehydration.  Discussed with patient findings of studies and recommendation for admission for ongoing workup and treatment and he is in agreement treatment plan.  Hospitalist consulted for admission for ongoing care.        Final Clinical Impression(s) / ED Diagnoses Final  diagnoses:  Hematemesis with nausea  Generalized abdominal pain  AKI (acute kidney injury) [facility])    Rx / DC Orders ED Discharge Orders     None         Kelsey Patricia, MD 12/13/23 [phone]

## 2023-12-13 NOTE — ED Notes (Signed)
 Patient had medium solid bowel movement.

## 2023-12-13 NOTE — Progress Notes (Signed)
 0542 AO x 4. Came on stretcher accompanied by transporter from ED.  Transferred to bed, made comfortable. Oriented to environs.  Pt reported he stays at a Rehab & have not poop for about approx. 5-6 days then he started having N/V with emesis containing blood.for ~ 2-3 days. Also reported passing medium amt of stool this Am & has felt much better since.

## 2023-12-13 NOTE — Consult Note (Signed)
 Reason for Consult: Esophagitis, epigastric pain, complaints of GERD Referring Physician: [facility]  Tilton Panning HPI: This is a 68 year old male with poorly DM and critical limb ischemia admitted for complaints of abdominal pain.  The patient states that his symptoms of epigastric abdominal pain started when he was in rehab for his foot.  He reports suffering with nausea, vomiting, and GERD symptoms.  Imaging of his abdomen in the ER showed that there was evidence of an esophagitis.  The patient denied any issues with hematemesis and his HGB on admission was at 15 g/dL and it declined to 13 g/dL, but his baseline appears to be in the 12 g/dL range.  With some of the vomiting he reported having some black vomit.  He was heme positive in the ER.  Past Medical History:  Diagnosis Date   Diabetes mellitus [facility])     Past Surgical History:  Procedure Laterality Date   ABDOMINAL AORTOGRAM N/A 11/20/2023   Procedure: ABDOMINAL AORTOGRAM;  Surgeon: Young Hensen, MD;  Location[facility] INVASIVE CV LAB;  Service: Cardiovascular;  Laterality: N/A;   LOWER EXTREMITY ANGIOGRAPHY N/A 11/20/2023   Procedure: Lower Extremity Angiography;  Surgeon: Young Hensen, MD;  Location: [facility] INVASIVE CV LAB;  Service: Cardiovascular;  Laterality: N/A;   LOWER EXTREMITY INTERVENTION N/A 11/20/2023   Procedure: LOWER EXTREMITY INTERVENTION;  Surgeon: Young Hensen, MD;  Location: [facility] INVASIVE CV LAB;  Service: Cardiovascular;  Laterality: N/A;   METATARSAL HEAD EXCISION Left 11/21/2023   Procedure: EXCISION, METATARSAL BONE, HEAD;  Surgeon: Evertt Hoe, DPM;  Location: [facility] OR;  Service: Orthopedics/Podiatry;  Laterality: Left;  I&D, bone biopsy vs 1st met head resction, abx beads, graft application    History reviewed. No pertinent family history.  Social History:  reports that he has never smoked. He has never used smokeless tobacco. He reports that he does not drink alcohol and does not  use drugs.  Allergies: No Known Allergies  Medications: Scheduled:  atorvastatin   40 mg Oral Daily   insulin  aspart  0-9 Units Subcutaneous TID WC   insulin  glargine-yfgn  12 Units Subcutaneous BID   pantoprazole  (PROTONIX ) IV  40 mg Intravenous Q12H   pioglitazone  15 mg Oral Daily   QUEtiapine   50 mg Oral q AM   sucralfate  1 g Oral TID WC & HS   Continuous:  sodium chloride  75 mL/hr at 12/13/23 1134    Results for orders placed or performed during the hospital encounter of 12/13/23 (from the past 24 hours)  Comprehensive metabolic panel     Status: Abnormal   Collection Time: 12/13/23  1:35 AM  Result Value Ref Range   Sodium 137 135 - 145 mmol/L   Potassium 4.8 3.5 - 5.1 mmol/L   Chloride 100 98 - 111 mmol/L   CO2 19 (L) 22 - 32 mmol/L   Glucose, Bld 163 (H) 70 - 99 mg/dL   BUN 38 (H) 8 - 23 mg/dL   Creatinine, Ser 1.61 (H) 0.61 - 1.24 mg/dL   Calcium  9.6 8.9 - 10.3 mg/dL   Total Protein 7.8 6.5 - 8.1 g/dL   Albumin 4.3 3.5 - 5.0 g/dL   AST 31 15 - 41 U/L   ALT 32 0 - 44 U/L   Alkaline Phosphatase 42 38 - 126 U/L   Total Bilirubin 0.9 0.0 - 1.2 mg/dL   GFR, Estimated 29 (L) >60 mL/min   Anion gap 18 (H) 5 - 15  CBC with  Differential     Status: Abnormal   Collection Time: 12/13/23  1:35 AM  Result Value Ref Range   WBC 10.2 4.0 - 10.5 K/uL   RBC 5.27 4.22 - 5.81 MIL/uL   Hemoglobin 15.1 13.0 - 17.0 g/dL   HCT 40.9 81.1 - 91.4 %   MCV 85.8 80.0 - 100.0 fL   MCH 28.7 26.0 - 34.0 pg   MCHC 33.4 30.0 - 36.0 g/dL   RDW 78.2 95.6 - 21.3 %   Platelets 224 150 - 400 K/uL   nRBC 0.0 0.0 - 0.2 %   Neutrophils Relative % 79 %   Neutro Abs 8.1 (H) 1.7 - 7.7 K/uL   Lymphocytes Relative 15 %   Lymphs Abs 1.5 0.7 - 4.0 K/uL   Monocytes Relative 5 %   Monocytes Absolute 0.5 0.1 - 1.0 K/uL   Eosinophils Relative 0 %   Eosinophils Absolute 0.0 0.0 - 0.5 K/uL   Basophils Relative 1 %   Basophils Absolute 0.1 0.0 - 0.1 K/uL   Immature Granulocytes 0 %   Abs Immature  Granulocytes 0.03 0.00 - 0.07 K/uL  Lipase, blood     Status: None   Collection Time: 12/13/23  1:35 AM  Result Value Ref Range   Lipase 22 11 - 51 U/L  Occult bld gastric/duodenum (cup to lab)     Status: Abnormal   Collection Time: 12/13/23  1:36 AM  Result Value Ref Range   pH, Gastric NOT DONE    Occult Blood, Gastric POSITIVE (A) NEGATIVE  I-stat chem 8, ED     Status: Abnormal   Collection Time: 12/13/23  1:43 AM  Result Value Ref Range   Sodium 137 135 - 145 mmol/L   Potassium 4.7 3.5 - 5.1 mmol/L   Chloride 104 98 - 111 mmol/L   BUN 43 (H) 8 - 23 mg/dL   Creatinine, Ser 0.86 (H) 0.61 - 1.24 mg/dL   Glucose, Bld 578 (H) 70 - 99 mg/dL   Calcium , Ion 1.19 1.15 - 1.40 mmol/L   TCO2 22 22 - 32 mmol/L   Hemoglobin 15.0 13.0 - 17.0 g/dL   HCT 46.9 62.9 - 52.8 %  I-Stat Lactic Acid     Status: Abnormal   Collection Time: 12/13/23  1:44 AM  Result Value Ref Range   Lactic Acid, Venous 2.2 (HH) 0.5 - 1.9 mmol/L   Comment NOTIFIED PHYSICIAN   CBG monitoring, ED     Status: Abnormal   Collection Time: 12/13/23  1:46 AM  Result Value Ref Range   Glucose-Capillary 163 (H) 70 - 99 mg/dL  I-Stat Lactic Acid     Status: None   Collection Time: 12/13/23  4:30 AM  Result Value Ref Range   Lactic Acid, Venous 1.7 0.5 - 1.9 mmol/L  Hemoglobin and hematocrit, blood     Status: None   Collection Time: 12/13/23  8:27 AM  Result Value Ref Range   Hemoglobin 13.4 13.0 - 17.0 g/dL   HCT 41.3 24.4 - 01.0 %  Glucose, capillary     Status: Abnormal   Collection Time: 12/13/23 10:23 AM  Result Value Ref Range   Glucose-Capillary 150 (H) 70 - 99 mg/dL  Hemoglobin and hematocrit, blood     Status: None   Collection Time: 12/13/23  4:37 PM  Result Value Ref Range   Hemoglobin 13.2 13.0 - 17.0 g/dL   HCT 27.2 53.6 - 64.4 %  Glucose, capillary     Status: Abnormal  Collection Time: 12/13/23  5:17 PM  Result Value Ref Range   Glucose-Capillary 143 (H) 70 - 99 mg/dL     CT ABDOMEN PELVIS  WO CONTRAST Result Date: 12/13/2023 CLINICAL DATA:  Abdominal pain EXAM: CT ABDOMEN AND PELVIS WITHOUT CONTRAST TECHNIQUE: Multidetector CT imaging of the abdomen and pelvis was performed following the standard protocol without IV contrast. RADIATION DOSE REDUCTION: This exam was performed according to the departmental dose-optimization program which includes automated exposure control, adjustment of the mA and/or kV according to patient size and/or use of iterative reconstruction technique. COMPARISON:  08/22/2018 FINDINGS: Lower chest: Minimal dependent atelectasis in the bilateral lower lobes. Hepatobiliary: Unenhanced liver is unremarkable. Gallbladder is unremarkable. No intrahepatic or extrahepatic ductal dilatation. Pancreas: Mild parenchymal atrophy. Spleen: Within normal limits. Adrenals/Urinary Tract: Adrenal glands are within normal limits. Kidneys are within normal limits. No renal, ureteral, or bladder calculi. No hydronephrosis. Bladder is within normal limits. Stomach/Bowel: Mild wall thickening involving the mid/distal esophagus (image 3), nonspecific, correlate for esophagitis. Associated tiny hiatal hernia. No evidence of bowel obstruction. Normal appendix (image 51). No colonic wall thickening or inflammatory changes. Mild to moderate left colonic stool burden. Vascular/Lymphatic: No evidence of abdominal aortic aneurysm. Atherosclerotic calcifications of the abdominal aorta and branch vessels. No suspicious abdominopelvic lymphadenopathy. Reproductive: Prostate is unremarkable. Other: No abdominopelvic ascites. Musculoskeletal: Degenerative changes of the visualized thoracolumbar spine. Grade 1 spondylolisthesis at L5-S1. IMPRESSION: Mild lower esophageal wall thickening, correlate for esophagitis. Otherwise negative CT abdomen/pelvis. Electronically Signed   By: Zadie Herter M.D.   On: 12/13/2023 02:46   DG Chest Port 1 View Result Date: 12/13/2023 CLINICAL DATA:  Shortness of breath,  vomiting EXAM: PORTABLE CHEST 1 VIEW COMPARISON:  04/10/2023 FINDINGS: Lungs are clear.  No pleural effusion or pneumothorax. The heart is normal in size. IMPRESSION: No acute cardiopulmonary disease. Electronically Signed   By: Zadie Herter M.D.   On: 12/13/2023 01:55    ROS:  As stated above in the HPI otherwise negative.  Blood pressure 110/72, pulse (!) 113, temperature 98.8 F (37.1 C), temperature source Oral, resp. rate 17, height 5\' 11"  (1.803 m), weight 81.6 kg, SpO2 100%.    PE: Gen: NAD, Alert and Oriented HEENT:  [place]/AT, EOMI Neck: Supple, no LAD Lungs: CTA Bilaterally CV: RRR without M/G/R ABD: Soft, NTND, +BS Ext: No C/C/E  Assessment/Plan: 1) Esophagitis on the CT scan. 2) Nausea/vomiting. 3) Epigastric pain. 4) Poorly controlled DM.   His symptoms are consistent with GERD and there is evidence of an esophagitis.  Further evaluation will be performed with an EGD.  Currently he is stable and there is no evidence of any bleeding.  Plan: 1) EGD tomorrow. 2) Follow HGB. 3) Maintain PPI.  Tiwana Chavis D 12/13/2023, 6:38 PM

## 2023-12-13 NOTE — H&P (Addendum)
 History and Physical    Patient: Jeremiah Burns ZOX:[id number] DOB: 1956/07/18 DOA: 12/13/2023 DOS: the patient was seen and examined on 12/13/2023 PCP: System, Provider Not In  Patient coming from: Home  Chief Complaint:  Chief Complaint  Patient presents with   Emesis   Abdominal Pain   HPI: Jeremiah Burns is a 68 y.o. 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  Patient just recently hospitalized from 4/1-4/11 after presenting with a infected diabetic foot ulcer.  Patient had MRI that was negative for osteomyelitis but still noted to have clinical concern.  Vascular surgery was consulted due to concern of left lower extremity critical limb ischemia and patient underwent angioplasty of the left posterior tibial artery on 4/3.  Subsequently, patient underwent I&D with tibial sesamoidectomy, bone biopsy which noted healed osteomyelitis and placement of Integra allograft and antibiotic beads by Dr. Rosemarie Conquest on 4/4. Interim operative cultures grew Staphylococcus simulans, moderate Diphtheroids, rare Proteus species.  ID was consulted and recommended patient be on Zyvox  600 mg twice daily and Bactrim  twice daily for total 3 weeks postsurgery to be completed on 4/25.    He has been experiencing vomiting for the past two to three days, with the vomit described as black and resembling coffee grounds. The last episode occurred yesterday. He continues to experience indigestion.  Abdominal pain is localized to the lower part of the stomach. He has been constipated but had a bowel movement yesterday. He is unsure of the appearance of his stool and has not noticed any black or tarry stools.  His last bowel movement was yesterday.  No use of ibuprofen, BC powders, or similar medications. No smoking or alcohol consumption.  He mentions a previous endoscopic  procedure performed in [place], though the exact timing is unclear. Additionally, he mentions a procedure on his toe, which involved an incision and drainage due to a hole in the toe.    Upon admission into the emergency department patient was noted to be afebrile with pulse 101-109, respirations relatively maintained, blood pressures 125/83 160/90, and O2 saturation maintained on room air.  Labs significant for hemoglobin 15, BUN 43, creatinine 2.38, and lactic acid 2.2-> 1.7.  Stool guaiacs were noted to be positive.  Chest x-ray showed no acute abnormality.  CT scan of the abdomen and pelvis was obtained which noted mild lower esophageal wall thickening thought to correlate with esophagitis.  Patient was given 1 L normal saline IV fluids, Protonix  40 mg IV, Zofran , Reglan, and fentanyl .   Review of Systems: As mentioned in the history of present illness. All other systems reviewed and are negative. Past Medical History:  Diagnosis Date   Diabetes mellitus [facility])    Past Surgical History:  Procedure Laterality Date   ABDOMINAL AORTOGRAM N/A 11/20/2023   Procedure: ABDOMINAL AORTOGRAM;  Surgeon: Young Hensen, MD;  Location[facility] INVASIVE CV LAB;  Service: Cardiovascular;  Laterality: N/A;   LOWER EXTREMITY ANGIOGRAPHY N/A 11/20/2023   Procedure: Lower Extremity Angiography;  Surgeon: Young Hensen, MD;  Location: [facility] INVASIVE CV LAB;  Service: Cardiovascular;  Laterality: N/A;   LOWER EXTREMITY INTERVENTION N/A 11/20/2023   Procedure: LOWER EXTREMITY INTERVENTION;  Surgeon: Young Hensen, MD;  Location: [facility] INVASIVE CV LAB;  Service: Cardiovascular;  Laterality: N/A;   METATARSAL HEAD EXCISION Left 11/21/2023   Procedure: EXCISION, METATARSAL BONE, HEAD;  Surgeon: Evertt Hoe, DPM;  Location:  [facility] OR;  Service: Orthopedics/Podiatry;  Laterality: Left;  I&D, bone biopsy vs 1st met head resction, abx beads, graft application   Social History:  reports that he has never smoked.  He has never used smokeless tobacco. He reports that he does not drink alcohol and does not use drugs.  No Known Allergies  History reviewed. No pertinent family history.  Prior to Admission medications   Medication Sig Start Date End Date Taking? Authorizing Provider  acetaminophen  (TYLENOL ) 500 MG tablet Take 1,000 mg by mouth in the morning and at bedtime. For 4 days   Yes [provider]  aspirin  EC 81 MG tablet Take 1 tablet (81 mg total) by mouth daily. Swallow whole. 11/29/23  Yes Dahal, Aminta Baldy, MD  atorvastatin  (LIPITOR) 40 MG tablet Take 1 tablet (40 mg total) by mouth daily. 11/29/23  Yes Dahal, Aminta Baldy, MD  clopidogrel  (PLAVIX ) 75 MG tablet Take 1 tablet (75 mg total) by mouth daily with breakfast. 11/29/23  Yes Dahal, Aminta Baldy, MD  empagliflozin (JARDIANCE) 25 MG TABS tablet Take 1 tablet by mouth daily. 09/05/23  Yes [provider]  feeding supplement (ENSURE ENLIVE / ENSURE PLUS) LIQD Take 237 mLs by mouth 2 (two) times daily between meals. 11/28/23  Yes Dahal, Aminta Baldy, MD  insulin  lispro (HUMALOG KWIKPEN) 100 UNIT/ML KwikPen Inject 0-12 Units into the skin 4 (four) times daily -  before meals and at bedtime. Per sliding scale, if 71-110= 0,  111-150= 3,  151-200= 4,  201-250= 6,  251-300= 8,  301-350= 10,  351-400= 12   Yes [provider]  lisinopril (ZESTRIL) 10 MG tablet Take 1 tablet by mouth daily. 05/19/23  Yes [provider]  ondansetron  (ZOFRAN ) 4 MG tablet Take 4 mg by mouth every 8 (eight) hours as needed for nausea or vomiting.   Yes [provider]  oxyCODONE  (OXY IR/ROXICODONE ) 5 MG immediate release tablet Take 1 tablet (5 mg total) by mouth every 6 (six) hours as needed for moderate pain (pain score 4-6). 11/28/23  Yes Dahal, Aminta Baldy, MD  pioglitazone (ACTOS) 15 MG tablet Take 1 tablet by mouth daily. 11/06/23  Yes [provider]  QUEtiapine  (SEROQUEL ) 50 MG tablet Take 50 mg by mouth in the morning. 06/03/23  Yes [provider]  SEMGLEE , YFGN, 100 UNIT/ML Pen Inject 12 Units into the skin 2 (two) times daily. 11/28/23  Yes Dahal, Aminta Baldy, MD  senna-docusate (SENOKOT-S) 8.6-50 MG tablet Take 1 tablet by mouth 2 (two) times daily. 11/28/23  Yes Dahal, Aminta Baldy, MD  sulfamethoxazole -trimethoprim  (BACTRIM  DS) 800-160 MG tablet Take 1 tablet by mouth 2 (two) times daily. For 14 days   Yes [provider]  linezolid  (ZYVOX ) 600 MG tablet Take 600 mg by mouth 2 (two) times daily. Patient not taking: Reported on 12/13/2023    [provider]  polyethylene glycol (MIRALAX  / GLYCOLAX ) 17 g packet Take 17 g by mouth daily as needed. Patient not taking: Reported on 12/13/2023 11/28/23   Hoyt Macleod, MD  TREMFYA 100 MG/ML pen  10/20/23   [provider]    Physical Exam: Vitals:   12/13/23 0122 12/13/23 0126 12/13/23 0415 12/13/23 0611  BP:  125/80 (!) 160/90 126/79  Pulse:  (!) 107 (!) 101 (!) 109  Resp:  20 (!) 21 17  Temp:  98.8 F (37.1 C) 98.8 F (37.1 C) 98.8 F (37.1 C)  TempSrc:  Oral Oral Oral  SpO2:  100% 100% 99%  Weight: 81.6 kg  Height: 5\' 11"  (1.803 m)      Constitutional: Elderly male who appears to be in some discomfort. Eyes: PERRL, lids and conjunctivae normal ENMT: Mucous membranes are moist.  Hard of hearing. Neck: normal, supple  Respiratory: clear to auscultation bilaterally, no wheezing, no crackles. Normal respiratory effort. No accessory muscle use.  Cardiovascular: Regular rate and rhythm, no murmurs / rubs / gallops. No extremity edema.   Abdomen: Tenderness to palpation noted.  Bowel sounds are present. Musculoskeletal: no clubbing / cyanosis. No joint deformity upper and lower extremities. Good ROM, no contractures. Normal muscle tone.  Skin: Wound present on the left foot without surrounding erythema.  Noted to have bruising lateral aspect of the left foot. Neurologic: CN 2-12 grossly intact.  Strength 5/5 in all 4.  Psychiatric: Normal judgment and  insight. Alert and oriented x 3. Normal mood.   Data Reviewed:  EKG reveals sinus tachycardia 102 bpm.  Reviewed labs, imaging, and pertinent records as documented. Assessment and Plan:  Hematemesis secondary to esophagitis/Mallory-Weiss tear Patient presents with reports of 2 to 3 days of abdominal pain with vomiting coffee-ground material.  Hemoglobin initially 15.1 with repeat check 13.4.  Stool guaiacs were noted to be positive.  CT scan of the abdomen pelvis significant for lower esophageal wall thickening thought to correlate with esophagitis.  Denies any NSAID use.  Suspecting upper GI bleed secondary to esophagitis gastritis versus Mallory-Weiss tear reports of persistent vomiting versus other. - Admit to a telemetry bed - Aspiration precautions with elevation head of the bed - Clear liquid diet - Check H&H every 8 hours x 3 - Continue Protonix  40 mg IV twice daily - Carafate -Plan to resume aspirin  and Plavix  - Antiemetics as needed  Lactic acidosis Resolved.  Initial lactic acid elevated at 3.2.  Repeat check 1.7 after initial IV fluids.  Suspect likely secondary to decreased p.o. intake.  Acute kidney injury superimposed on chronic kidney disease stage IIIb Patient presents with creatinine elevated up to 2.38 with BUN 38.  Baseline creatinine previously noted to be around 1.3-1.6 on 4/11.  Suspect prerenal in nature given reported symptoms of nausea and vomiting - Monitor intake and output - Check urinalysis - Avoid possible nephrotoxic agents such as lisinopril - Continue normal saline IV fluids at 75 mL/h  Uncontrolled diabetes mellitus type 2 with peripheral neuropathy On admission glucose is mildly elevated 163.  Hemoglobin A1c 8.9 on 11/19/2023.    - Hypoglycemic protocols - Hold Jardiance due to AKI - Continue Semglee  12 units twice daily - CBGs before every meal with sensitive SSI - Adjust insulin  regimen as deemed medically appropriate  History of osteomyelitis of  the left foot Patient just recently hospitalized 4/1 - 4/11 for osteomyelitis of the left foot status post I&D with bone biopsy).  Cultures grew out multiple species for which patient was placed on Zyvox  and Bactrim  completed 3-week course ending yesterday. - Continue wound care - Continue outpatient follow-up with podiatry  PAD  Hyperlipidemia Patient's status post angioplasty of the left posterior tibial artery on 4/3. - Continue atorvastatin  - Resume aspirin  and Plavix  when medically appropriate   Essential hypertension -Held lisinopril due to AKI  Anxiety and depression - Continue Seroquel   DVT prophylaxis: SCDs  Advance Care Planning:   Code Status: Full Code    Consults: Gastroenterology  Family Communication: None requested  Severity of Illness: The appropriate patient status for this patient is INPATIENT. Inpatient status is judged to be reasonable and necessary in order  to provide the required intensity of service to ensure the patient's safety. The patient's presenting symptoms, physical exam findings, and initial radiographic and laboratory data in the context of their chronic comorbidities is felt to place them at high risk for further clinical deterioration. Furthermore, it is not anticipated that the patient will be medically stable for discharge from the hospital within 2 midnights of admission.   * I certify that at the point of admission it is my clinical judgment that the patient will require inpatient hospital care spanning beyond 2 midnights from the point of admission due to high intensity of service, high risk for further deterioration and high frequency of surveillance required.*  Author: Lena Qualia, MD 12/13/2023 8:10 AM  For on call review [email or web address].

## 2023-12-13 NOTE — Consult Note (Signed)
 WOC has been consulted for foot wound, reviewed chart. Patient has been followed by podiatry outpatient, last image has graft stapled in place. Requested podiatry be consulted for this patient  Integra graft 4/4 Appears current wound care is to apply antibiotic ointment and xeroform , 4x4 gauze I will add this wound care in the interim.   Re consult if needed, will not follow at this time. Thanks  Maxemiliano Riel [employer], RN,CWOCN, CNS, CWON-AP [phone])

## 2023-12-13 NOTE — ED Notes (Signed)
 Off floor to CT

## 2023-12-13 NOTE — ED Notes (Signed)
 CCMD called and patient placed on monitor

## 2023-12-14 ENCOUNTER — Encounter (HOSPITAL_COMMUNITY)
Admission: EM | Disposition: A | Discharge status: Skilled Nursing Facility | Payer: Self-pay | Source: Home / Self Care | Attending: Internal Medicine

## 2023-12-14 ENCOUNTER — Encounter (HOSPITAL_COMMUNITY): Payer: Self-pay | Admitting: Internal Medicine

## 2023-12-14 ENCOUNTER — Inpatient Hospital Stay (HOSPITAL_COMMUNITY): Admitting: Certified Registered Nurse Anesthetist

## 2023-12-14 DIAGNOSIS — K920 Hematemesis: Secondary | ICD-10-CM

## 2023-12-14 DIAGNOSIS — K2101 Gastro-esophageal reflux disease with esophagitis, with bleeding: Secondary | ICD-10-CM | POA: Diagnosis not present

## 2023-12-14 DIAGNOSIS — R1084 Generalized abdominal pain: Secondary | ICD-10-CM

## 2023-12-14 DIAGNOSIS — N179 Acute kidney failure, unspecified: Secondary | ICD-10-CM | POA: Diagnosis not present

## 2023-12-14 HISTORY — PX: ESOPHAGOGASTRODUODENOSCOPY: SHX5428

## 2023-12-14 HISTORY — PX: HEMOSTASIS CLIP PLACEMENT: SHX6857

## 2023-12-14 LAB — CBC
HCT: 30.7 % — ABNORMAL LOW (ref 39.0–52.0)
Hemoglobin: 10.2 g/dL — ABNORMAL LOW (ref 13.0–17.0)
MCH: 28.7 pg (ref 26.0–34.0)
MCHC: 33.2 g/dL (ref 30.0–36.0)
MCV: 86.5 fL (ref 80.0–100.0)
Platelets: 168 10*3/uL (ref 150–400)
RBC: 3.55 MIL/uL — ABNORMAL LOW (ref 4.22–5.81)
RDW: 13.4 % (ref 11.5–15.5)
WBC: 8.2 10*3/uL (ref 4.0–10.5)
nRBC: 0 % (ref 0.0–0.2)

## 2023-12-14 LAB — URINALYSIS, COMPLETE (UACMP) WITH MICROSCOPIC
Bacteria, UA: NONE SEEN
Bilirubin Urine: NEGATIVE
Glucose, UA: >=500 mg/dL — AB
Hgb urine dipstick: NEGATIVE
Ketones, ur: NEGATIVE mg/dL
Leukocytes,Ua: NEGATIVE
Nitrite: NEGATIVE
Protein, ur: 30 mg/dL — AB
Specific Gravity, Urine: 1.021 (ref 1.005–1.030)
pH: 5 (ref 5.0–8.0)

## 2023-12-14 LAB — BASIC METABOLIC PANEL WITH GFR
Anion gap: 8 (ref 5–15)
BUN: 51 mg/dL — ABNORMAL HIGH (ref 8–23)
CO2: 21 mmol/L — ABNORMAL LOW (ref 22–32)
Calcium: 8.5 mg/dL — ABNORMAL LOW (ref 8.9–10.3)
Chloride: 108 mmol/L (ref 98–111)
Creatinine, Ser: 1.97 mg/dL — ABNORMAL HIGH (ref 0.61–1.24)
GFR, Estimated: 37 mL/min — ABNORMAL LOW (ref >60)
Glucose, Bld: 155 mg/dL — ABNORMAL HIGH (ref 70–99)
Potassium: 4.4 mmol/L (ref 3.5–5.1)
Sodium: 137 mmol/L (ref 135–145)

## 2023-12-14 LAB — GLUCOSE, CAPILLARY
Glucose-Capillary: 141 mg/dL — ABNORMAL HIGH (ref 70–99)
Glucose-Capillary: 187 mg/dL — ABNORMAL HIGH (ref 70–99)
Glucose-Capillary: 152 mg/dL — ABNORMAL HIGH (ref 70–99)

## 2023-12-14 LAB — CREATININE, URINE, RANDOM: Creatinine, Urine: 99 mg/dL

## 2023-12-14 LAB — HEMOGLOBIN AND HEMATOCRIT, BLOOD
HCT: 34.9 % — ABNORMAL LOW (ref 39.0–52.0)
HCT: 28.8 % — ABNORMAL LOW (ref 39.0–52.0)
Hemoglobin: 11.6 g/dL — ABNORMAL LOW (ref 13.0–17.0)
Hemoglobin: 9.6 g/dL — ABNORMAL LOW (ref 13.0–17.0)

## 2023-12-14 LAB — SODIUM, URINE, RANDOM: Sodium, Ur: 44 mmol/L

## 2023-12-14 LAB — PREPARE RBC (CROSSMATCH)

## 2023-12-14 LAB — ABO/RH: ABO/RH(D): A NEG

## 2023-12-14 SURGERY — EGD (ESOPHAGOGASTRODUODENOSCOPY)
Anesthesia: General

## 2023-12-14 MED ORDER — INSULIN ASPART 100 UNIT/ML IJ SOLN
0.0000 [IU] | INTRAMUSCULAR | Status: DC
  Administered 2023-12-14 – 2023-12-15 (×4): 2 [IU] via SUBCUTANEOUS

## 2023-12-14 MED ORDER — METOCLOPRAMIDE HCL 5 MG/ML IJ SOLN
INTRAMUSCULAR | Status: DC | PRN
Start: 2023-12-14 — End: 2023-12-14
  Administered 2023-12-14: 5 mg via INTRAVENOUS

## 2023-12-14 MED ORDER — ONDANSETRON HCL 4 MG/2ML IJ SOLN
INTRAMUSCULAR | Status: DC | PRN
  Administered 2023-12-14: 4 mg via INTRAVENOUS

## 2023-12-14 MED ORDER — SODIUM CHLORIDE 0.9% IV SOLUTION: Freq: Once | INTRAVENOUS | Status: DC

## 2023-12-14 MED ORDER — SODIUM CHLORIDE 0.9 % IV SOLN
INTRAVENOUS | Status: DC | PRN
Start: 2023-12-14 — End: 2023-12-14

## 2023-12-14 MED ORDER — INSULIN GLARGINE-YFGN 100 UNIT/ML ~~LOC~~ SOLN
10.0000 [IU] | Freq: Every day | SUBCUTANEOUS | Status: DC
  Administered 2023-12-15 – 2023-12-16 (×2): 10 [IU] via SUBCUTANEOUS
  Filled 2023-12-14 (×3): qty 0.1

## 2023-12-14 MED ORDER — EPHEDRINE SULFATE-NACL 50-0.9 MG/10ML-% IV SOSY
PREFILLED_SYRINGE | INTRAVENOUS | Status: DC | PRN
  Administered 2023-12-14 (×2): 5 mg via INTRAVENOUS

## 2023-12-14 MED ORDER — LIDOCAINE 2% (20 MG/ML) 5 ML SYRINGE
INTRAMUSCULAR | Status: DC | PRN
  Administered 2023-12-14: 60 mg via INTRAVENOUS

## 2023-12-14 MED ORDER — SUCCINYLCHOLINE CHLORIDE 200 MG/10ML IV SOSY
PREFILLED_SYRINGE | INTRAVENOUS | Status: DC | PRN
  Administered 2023-12-14: 120 mg via INTRAVENOUS

## 2023-12-14 MED ORDER — DEXAMETHASONE SODIUM PHOSPHATE 10 MG/ML IJ SOLN
INTRAMUSCULAR | Status: DC | PRN
  Administered 2023-12-14: 4 mg via INTRAVENOUS

## 2023-12-14 MED ORDER — METOCLOPRAMIDE HCL 5 MG/ML IJ SOLN
INTRAMUSCULAR | Status: AC
  Filled 2023-12-14: qty 2

## 2023-12-14 MED ORDER — SUGAMMADEX SODIUM 200 MG/2ML IV SOLN
INTRAVENOUS | Status: DC | PRN
  Administered 2023-12-14 (×2): 100 mg via INTRAVENOUS

## 2023-12-14 MED ORDER — PHENYLEPHRINE 80 MCG/ML (10ML) SYRINGE FOR IV PUSH (FOR BLOOD PRESSURE SUPPORT)
PREFILLED_SYRINGE | INTRAVENOUS | Status: DC | PRN
  Administered 2023-12-14: 80 ug via INTRAVENOUS
  Administered 2023-12-14: 160 ug via INTRAVENOUS
  Administered 2023-12-14: 80 ug via INTRAVENOUS
  Administered 2023-12-14 (×4): 160 ug via INTRAVENOUS

## 2023-12-14 MED ORDER — ROCURONIUM BROMIDE 100 MG/10ML IV SOLN
INTRAVENOUS | Status: DC | PRN
  Administered 2023-12-14: 40 mg via INTRAVENOUS
  Administered 2023-12-14: 10 mg via INTRAVENOUS

## 2023-12-14 MED ORDER — LACTATED RINGERS IV SOLN: INTRAVENOUS | Status: AC

## 2023-12-14 MED ORDER — PROPOFOL 10 MG/ML IV BOLUS
INTRAVENOUS | Status: DC | PRN
  Administered 2023-12-14: 140 mg via INTRAVENOUS

## 2023-12-14 MED ORDER — ALBUMIN HUMAN 5 % IV SOLN
INTRAVENOUS | Status: DC | PRN
Start: 2023-12-14 — End: 2023-12-14

## 2023-12-14 MED ORDER — PHENYLEPHRINE HCL-NACL 20-0.9 MG/250ML-% IV SOLN
INTRAVENOUS | Status: DC | PRN
  Administered 2023-12-14: 40 ug/min via INTRAVENOUS

## 2023-12-14 NOTE — Op Note (Signed)
 [facility] Patient Name: Jeremiah Burns Procedure Date : 12/14/2023 MRN: [id number] Attending MD: Alvis Jourdain , MD, [id number] Date of Birth: Aug 26, 1955 CSN: [id number] Age: 68 Admit Type: Inpatient Procedure:                Upper GI endoscopy Indications:              Epigastric abdominal pain, Hematemesis, Abnormal CT                            of the GI tract Providers:                Alvis Jourdain, MD, Lorenzo Romberg, RN, Tyrus Gallus, Technician Referring MD:              Medicines:                Propofol  per Anesthesia Complications:            No immediate complications. Estimated Blood Loss:      Procedure:                Pre-Anesthesia Assessment:                           - Prior to the procedure, a History and Physical                            was performed, and patient medications and                            allergies were reviewed. The patient's tolerance of                            previous anesthesia was also reviewed. The risks                            and benefits of the procedure and the sedation                            options and risks were discussed with the patient.                            All questions were answered, and informed consent                            was obtained. Prior Anticoagulants: The patient has                            taken no anticoagulant or antiplatelet agents. ASA                            Grade Assessment: III - A patient with severe                            systemic  disease. After reviewing the risks and                            benefits, the patient was deemed in satisfactory                            condition to undergo the procedure.                           - Sedation was administered by an anesthesia                            professional. General anesthesia was attained.                           After obtaining informed consent, the endoscope was                             passed under direct vision. Throughout the                            procedure, the patient's blood pressure, pulse, and                            oxygen saturations were monitored continuously. The                            [id number]-H190 ([id number]) Olympus endoscope was introduced                            through the mouth, and advanced to the second part                            of duodenum. The upper GI endoscopy was performed                            with difficulty. The patient tolerated the                            procedure well. Scope In: Scope Out: Findings:      LA Grade D (one or more mucosal breaks involving at least 75% of       esophageal circumference) esophagitis with bleeding was found in the       lower third of the esophagus. To treat the bleeding lesion, hemostatic       spray was deployed. There was no bleeding at the end of the procedure.      Clotted blood was found in the gastric fundus.      The examined duodenum was normal.      Before the procedure it was noted that the patient was experiencing       hematemesis. Anesthesia intubated the patient. The endoscope was       introduced into the esophagus and a large fresh clot was noted in the       mid to distal esophagus. Fresh blood was noted in the region.  The       endoscope was passed into the stomach and more clots were noted in the       proximal stomach. In the duodenum there was evidence of fresh blood and       some clots. At first the source of bleeding was not known. The duodenum       had the least amount of clot burden and it was carefully washed away. No       reaccumulation of blood was noted in this area and this site was       excluded as a source of bleeding. Attempts were made to clear the clots       from the fundus without any success. Because of the fresh clot in the       esophagus, attention was focused in this region. After several failed       attempts with  breaking down the clot with various snares and the [employer], a portion of the clot was captured. The clot was moved from the       esophagus and dragged into the gastric lumen. This allowed the distal       esophagus to be cleared and fortunately there was no evidence of any       visible vessel or worsening of the bleeding. Only minimal oozing was       noted. Using the therapeutic endoscope multiple large clots were       suctioned and dragged out of the stomach. The clot burden was markedly       reduced, but the fundus was not visible. Better visualization, but not       full visualization, was obtained with moving the patient into the left       lateral decubitus position and the right lateral decubitus position from       the supine position. There was no evidence of any reaccumulation of       blood in the gastric lumen, making it less likely to be the source of       bleeding. With the large fresh clot and oozing in the esophagus,       PuraStat gel was injected and coated the distal esophagus. To obtain       complete coverage of the area the lumen was decompressed. With the       significant amount of bleeding before the procedure, the anesthesia       performed a type and screen and started blood transfusions. He remained       hemodynamically stable during the procedure. Impression:               - LA Grade D reflux esophagitis with bleeding.                            Hemostatic spray applied.                           - Clotted blood in the gastric fundus.                           - Normal examined duodenum.                           - No specimens collected.  Recommendation:           - Return patient to hospital ward for ongoing care.                           - NPO.                           - Continue present medications.                           - Serial CBCs.                           - Possible repeat EGD in 1-2 days to evaluate the                             fundus.                           - [facility] will assume care in the AM. Procedure Code(s):        --- Professional ---                           [phone], Esophagogastroduodenoscopy, flexible,                            transoral; with control of bleeding, any method Diagnosis Code(s):        --- Professional ---                           K21.01, Gastro-esophageal reflux disease with                            esophagitis, with bleeding                           K92.2, Gastrointestinal hemorrhage, unspecified                           R10.13, Epigastric pain                           K92.0, Hematemesis                           R93.3, Abnormal findings on diagnostic imaging of                            other parts of digestive tract CPT copyright 2022 [employer]. All rights reserved. The codes documented in this report are preliminary and upon coder review may  be revised to meet current compliance requirements. Alvis Jourdain, MD Alvis Jourdain, MD 12/14/2023 12:52:41 PM This report has been signed electronically. Number of Addenda: 0

## 2023-12-14 NOTE — Progress Notes (Signed)
 Contact ordering MD about blood transfusion.

## 2023-12-14 NOTE — Plan of Care (Signed)

## 2023-12-14 NOTE — Progress Notes (Addendum)
 0450 Pt noted to have vomited 200 ml intermittently Bright Red Blood secretion/substances. Charge RN notified & MD to be notified.   [phone] Kept NPO since midnight as prescribed for EDG today.    4782 Sucralfate & Seroquel  P.O can be administered after EDG as per Dr Amalia Jung, MD.

## 2023-12-14 NOTE — Progress Notes (Signed)
 [facility]                                                                              Marselino Cazier, is a 68 y.o. male, DOB - June 28, 1956, BJS:[id number] Admit date - 12/13/2023    Outpatient Primary MD for the patient is System, Provider Not In  LOS - 1  days  Chief Complaint  Patient presents with   Emesis   Abdominal Pain       Brief summary   Patient is a 68 year old male with poorly controlled IDDM type II, peripheral neuropathy, HLD, PAD presented with nausea vomiting and abdominal pain. Patient was recently hospitalized 4/1-4/11 after presenting with infected diabetic foot ulcer concerning for osteomyelitis.  Vascular surgery was consulted and underwent angioplasty of the left posterior tibial artery on 4/3.  Underwent I&D and bone biopsy with podiatry on 4/4.Interim operative cultures grew Staphylococcus simulans, moderate Diphtheroids, rare Proteus species.  ID was consulted and recommended patient be on Zyvox  600 mg twice daily and Bactrim  twice daily for total 3 weeks, EOT 4/25. Patient reported vomiting for the last 2 to 3 days with coffee-ground emesis, nausea, indigestion with abdominal pain in the lower part of the stomach.  Denied any hematochezia or melena. No NSAID use. NAD, creatinine 2.38, lactic acid 2.2.  Hemoglobin stable initially in ED 15.0  Assessment & Plan    Principal Problem:   Hematemesis, acute upper GI bleed, acute blood loss anemia - Hemoglobin 15.1 on arrival however likely hemoconcentrated due to severe dehydration, nausea and vomiting.  Hemoglobin was 12.7 on 11/28/2023 -CT ab showed mild lower esophageal wall thickening consistent with esophagitis - Patient reports continues to have hematemesis this morning, hemoglobin 10.2 - GI has been consulted, plan for EGD today - Continue IV PPI, sucralfate -Hold aspirin , Plavix   Active Problems:   Lactic acidosis -Likely secondary to dehydration, nausea and  vomiting -Resolved  Acute kidney injury superimposed on chronic kidney disease stage IIIb -Creatinine 2.38 on admission worsened to 2.5, baseline 1.3-1.6  -Hold lisinopril - Placed on IV fluid hydration, bmet pending this morning   Uncontrolled diabetes mellitus type 2 with peripheral neuropathy, IDDM -Hold Jardiance,  - Hemoglobin A1c 8.9 on 11/19/2023 - Continue sliding scale insulin , sensitive, Semglee  12 units twice daily  History of osteomyelitis of the left foot - recently hospitalized 4/1 - 4/11 for osteomyelitis of the left foot status post I&D with bone biopsy).  Cultures grew out multiple species for which patient was placed on Zyvox  and Bactrim  completed 3-week course, EOT 12/12/2023 - Continue wound care - Continue outpatient follow-up with podiatry   PAD  Hyperlipidemia - s/p angioplasty of the left posterior tibial artery on 4/3. - Continue atorvastatin  - Hold aspirin  and Plavix  until cleared to resume by GI   Essential hypertension - Hold lisinopril due to AKI   Anxiety and depression - Continue Seroquel    Estimated body mass index is 25.1 kg/m as calculated from the following:   Height as of this encounter: 5\' 11"  (1.803 m).   Weight as of this encounter: 81.6 kg.  Code Status: Full code DVT Prophylaxis:  SCDs Start: 12/13/23  1610   Level of Care: Level of care: Telemetry Medical Family Communication: Updated patient Disposition Plan:      Remains inpatient appropriate:      Procedures:    Consultants:   GI  Antimicrobials:   Anti-infectives (From admission, onward)    None          Medications  atorvastatin   40 mg Oral Daily   insulin  aspart  0-9 Units Subcutaneous TID WC   insulin  glargine-yfgn  12 Units Subcutaneous BID   pantoprazole  (PROTONIX ) IV  40 mg Intravenous Q12H   pioglitazone  15 mg Oral Daily   polymixin-bacitracin   Topical QODAY   QUEtiapine   50 mg Oral q AM   sucralfate  1 g Oral TID WC & HS      Subjective:    Baretta Lafon was seen and examined today.  States still having hematemesis, wants EGD done soon so he can drink something.  No active vomiting during encounter, no chest pain, shortness of breath, fevers or chills.    Objective:   Vitals:   12/13/23 1611 12/13/23 2040 12/14/23 0426 12/14/23 0846  BP: 110/72 122/62 123/73 (!) 98/59  Pulse: (!) 113 94 (!) 104 (!) 103  Resp:  16 16 19   Temp: 98.8 F (37.1 C) 97.8 F (36.6 C) 98.1 F (36.7 C) 98 F (36.7 C)  TempSrc: Oral Oral Oral Oral  SpO2: 100% 100% 100% 97%  Weight:      Height:        Intake/Output Summary (Last 24 hours) at 12/14/2023 1002 Last data filed at 12/14/2023 0450 Gross per 24 hour  Intake 818 ml  Output 2100 ml  Net -1282 ml     Wt Readings from Last 3 Encounters:  12/13/23 81.6 kg  11/21/23 94.4 kg  05/25/15 86.2 kg     Exam General: Alert and oriented x 3, NAD Cardiovascular: S1 S2 auscultated,  RRR Respiratory: Clear to auscultation bilaterally, no wheezing Gastrointestinal: Soft, nontender, nondistended, + bowel sounds Ext: no pedal edema bilaterally Neuro: No new deficits Psych: Normal affect     Data Reviewed:  I have personally reviewed following labs    CBC Lab Results  Component Value Date   WBC 8.2 12/14/2023   RBC 3.55 (L) 12/14/2023   HGB 10.2 (L) 12/14/2023   HCT 30.7 (L) 12/14/2023   MCV 86.5 12/14/2023   MCH 28.7 12/14/2023   PLT 168 12/14/2023   MCHC 33.2 12/14/2023   RDW 13.4 12/14/2023   LYMPHSABS 1.5 12/13/2023   MONOABS 0.5 12/13/2023   EOSABS 0.0 12/13/2023   BASOSABS 0.1 12/13/2023     Last metabolic panel Lab Results  Component Value Date   NA 137 12/13/2023   K 4.7 12/13/2023   CL 104 12/13/2023   CO2 19 (L) 12/13/2023   BUN 43 (H) 12/13/2023   CREATININE 2.50 (H) 12/13/2023   GLUCOSE 159 (H) 12/13/2023   GFRNONAA 29 (L) 12/13/2023   CALCIUM  9.6 12/13/2023   PROT 7.8 12/13/2023   ALBUMIN 4.3 12/13/2023   BILITOT 0.9 12/13/2023   ALKPHOS 42  12/13/2023   AST 31 12/13/2023   ALT 32 12/13/2023   ANIONGAP 18 (H) 12/13/2023    CBG (last 3)  Recent Labs    12/13/23 1717 12/13/23 2038 12/14/23 0849  GLUCAP 143* 170* 141*      Coagulation Profile: No results for input(s): "INR", "PROTIME" in the last 168 hours.   Radiology Studies: I have personally reviewed the imaging studies  CT ABDOMEN PELVIS WO CONTRAST Result Date: 12/13/2023 CLINICAL DATA:  Abdominal pain EXAM: CT ABDOMEN AND PELVIS WITHOUT CONTRAST TECHNIQUE: Multidetector CT imaging of the abdomen and pelvis was performed following the standard protocol without IV contrast. RADIATION DOSE REDUCTION: This exam was performed according to the departmental dose-optimization program which includes automated exposure control, adjustment of the mA and/or kV according to patient size and/or use of iterative reconstruction technique. COMPARISON:  08/22/2018 FINDINGS: Lower chest: Minimal dependent atelectasis in the bilateral lower lobes. Hepatobiliary: Unenhanced liver is unremarkable. Gallbladder is unremarkable. No intrahepatic or extrahepatic ductal dilatation. Pancreas: Mild parenchymal atrophy. Spleen: Within normal limits. Adrenals/Urinary Tract: Adrenal glands are within normal limits. Kidneys are within normal limits. No renal, ureteral, or bladder calculi. No hydronephrosis. Bladder is within normal limits. Stomach/Bowel: Mild wall thickening involving the mid/distal esophagus (image 3), nonspecific, correlate for esophagitis. Associated tiny hiatal hernia. No evidence of bowel obstruction. Normal appendix (image 51). No colonic wall thickening or inflammatory changes. Mild to moderate left colonic stool burden. Vascular/Lymphatic: No evidence of abdominal aortic aneurysm. Atherosclerotic calcifications of the abdominal aorta and branch vessels. No suspicious abdominopelvic lymphadenopathy. Reproductive: Prostate is unremarkable. Other: No abdominopelvic ascites.  Musculoskeletal: Degenerative changes of the visualized thoracolumbar spine. Grade 1 spondylolisthesis at L5-S1. IMPRESSION: Mild lower esophageal wall thickening, correlate for esophagitis. Otherwise negative CT abdomen/pelvis. Electronically Signed   By: Zadie Herter M.D.   On: 12/13/2023 02:46   DG Chest Port 1 View Result Date: 12/13/2023 CLINICAL DATA:  Shortness of breath, vomiting EXAM: PORTABLE CHEST 1 VIEW COMPARISON:  04/10/2023 FINDINGS: Lungs are clear.  No pleural effusion or pneumothorax. The heart is normal in size. IMPRESSION: No acute cardiopulmonary disease. Electronically Signed   By: Zadie Herter M.D.   On: 12/13/2023 01:55       Graylyn Bunney M.D. [facility] 12/14/2023, 10:02 AM  Available via Epic secure chat 7am-7pm After 7 pm, please refer to night coverage provider listed on amion.

## 2023-12-14 NOTE — Anesthesia Preprocedure Evaluation (Signed)
 Anesthesia Evaluation  Patient identified by MRN, date of birth, ID band Patient awake    Reviewed: Allergy & Precautions, NPO status , Patient's Chart, lab work & pertinent test results  History of Anesthesia Complications Negative for: history of anesthetic complications  Airway Mallampati: II  TM Distance: >3 FB Neck ROM: Full    Dental  (+) Edentulous Upper, Poor Dentition, Dental Advisory Given, Missing,    Pulmonary neg shortness of breath, neg sleep apnea, neg COPD, neg recent URI   breath sounds clear to auscultation       Cardiovascular hypertension, Pt. on medications (-) angina + Peripheral Vascular Disease  (-) Past MI  Rhythm:Regular     Neuro/Psych  PSYCHIATRIC DISORDERS Anxiety Depression    negative neurological ROS     GI/Hepatic Neg liver ROS,,,? GI bleed   Endo/Other  diabetes, Poorly Controlled  Lab Results      Component                Value               Date                      HGBA1C                   8.9 (H)             11/19/2023             Renal/GU ARF and CRFRenal diseaseLab Results      Component                Value               Date                      NA                       137                 12/14/2023                K                        4.4                 12/14/2023                CO2                      21 (L)              12/14/2023                GLUCOSE                  155 (H)             12/14/2023                BUN                      51 (H)              12/14/2023                CREATININE  1.97 (H)            12/14/2023                CALCIUM                   8.5 (L)             12/14/2023                GFRNONAA                 37 (L)              12/14/2023                Musculoskeletal   Abdominal   Peds  Hematology  (+) Blood dyscrasia, anemia Lab Results      Component                Value               Date                      WBC                       8.2                 12/14/2023                HGB                      10.2 (L)            12/14/2023                HCT                      30.7 (L)            12/14/2023                MCV                      86.5                12/14/2023                PLT                      168                 12/14/2023              Anesthesia Other Findings Actively vomiting   Reproductive/Obstetrics                              Anesthesia Physical Anesthesia Plan  ASA: 3 and emergent  Anesthesia Plan: General   Post-op Pain Management: Minimal or no pain anticipated   Induction: Intravenous, Cricoid pressure planned and Rapid sequence  PONV Risk Score and Plan: 2 and Ondansetron  and Metaclopromide  Airway Management Planned: Oral ETT  Additional Equipment: None  Intra-op Plan:   Post-operative Plan: Extubation in OR  Informed Consent: I have reviewed the patients History and Physical, chart, labs and discussed the procedure including the risks, benefits and alternatives for the proposed anesthesia with the patient or authorized representative who has  indicated his/her understanding and acceptance.     Dental advisory given  Plan Discussed with: CRNA  Anesthesia Plan Comments:          Anesthesia Quick Evaluation

## 2023-12-14 NOTE — Anesthesia Procedure Notes (Signed)
 Procedure Name: Intubation Date/Time: 12/14/2023 11:24 AM  Performed by: Bubba Carbo, CRNAPre-anesthesia Checklist: Patient identified, Emergency Drugs available, Suction available and Patient being monitored Patient Re-evaluated:Patient Re-evaluated prior to induction Oxygen Delivery Method: Circle system utilized Preoxygenation: Pre-oxygenation with 100% oxygen Induction Type: IV induction, Cricoid Pressure applied and Rapid sequence Laryngoscope Size: Mac and 4 Grade View: Grade I Tube type: Oral Tube size: 7.5 mm Number of attempts: 1 Airway Equipment and Method: Stylet and Oral airway Placement Confirmation: ETT inserted through vocal cords under direct vision, positive ETCO2 and breath sounds checked- equal and bilateral Secured at: 22 cm Tube secured with: Tape Dental Injury: Teeth and Oropharynx as per pre-operative assessment

## 2023-12-14 NOTE — Transfer of Care (Signed)
 Immediate Anesthesia Transfer of Care Note  Patient: Jeremiah Burns  Procedure(s) Performed: EGD (ESOPHAGOGASTRODUODENOSCOPY) CONTROL OF HEMORRHAGE, GI TRACT, ENDOSCOPIC, BY CLIPPING OR OVERSEWING  Patient Location: PACU  Anesthesia Type:General  Level of Consciousness: drowsy and patient cooperative  Airway & Oxygen Therapy: Patient Spontanous Breathing and Patient connected to face mask oxygen  Post-op Assessment: Report given to RN and Post -op Vital signs reviewed and stable  Post vital signs: Reviewed and stable  Last Vitals:  Vitals Value Taken Time  BP 134/73 12/14/23 1235  Temp    Pulse 94 12/14/23 1235  Resp 21 12/14/23 1235  SpO2 100 % 12/14/23 1235  Vitals shown include unfiled device data.  Last Pain:  Vitals:   12/14/23 1105  TempSrc: Temporal  PainSc: 0-No pain      Patients Stated Pain Goal: 0 (12/14/23 0505)  Complications: No notable events documented.

## 2023-12-14 NOTE — Anesthesia Postprocedure Evaluation (Signed)
 Anesthesia Post Note  Patient: Jeremiah Burns  Procedure(s) Performed: EGD (ESOPHAGOGASTRODUODENOSCOPY) CONTROL OF HEMORRHAGE, GI TRACT, ENDOSCOPIC, BY CLIPPING OR OVERSEWING     Patient location during evaluation: Nursing Unit Anesthesia Type: General Level of consciousness: patient cooperative Pain management: pain level controlled Vital Signs Assessment: post-procedure vital signs reviewed and stable Respiratory status: spontaneous breathing, nonlabored ventilation and respiratory function stable Cardiovascular status: blood pressure returned to baseline and stable Postop Assessment: no apparent nausea or vomiting Anesthetic complications: no   No notable events documented.  Last Vitals:  Vitals:   12/14/23 1250 12/14/23 1320  BP: (!) 123/57 131/73  Pulse: 93 94  Resp: 10 20  Temp:  (!) 36.4 C  SpO2: 94% 97%    Last Pain:  Vitals:   12/14/23 1320  TempSrc: Oral  PainSc:                  [name]

## 2023-12-15 DIAGNOSIS — N179 Acute kidney failure, unspecified: Secondary | ICD-10-CM | POA: Diagnosis not present

## 2023-12-15 DIAGNOSIS — K920 Hematemesis: Secondary | ICD-10-CM | POA: Diagnosis not present

## 2023-12-15 DIAGNOSIS — K209 Esophagitis, unspecified without bleeding: Secondary | ICD-10-CM

## 2023-12-15 DIAGNOSIS — R1084 Generalized abdominal pain: Secondary | ICD-10-CM | POA: Diagnosis not present

## 2023-12-15 LAB — CBC
HCT: 25.9 % — ABNORMAL LOW (ref 39.0–52.0)
HCT: 24.4 % — ABNORMAL LOW (ref 39.0–52.0)
Hemoglobin: 8.8 g/dL — ABNORMAL LOW (ref 13.0–17.0)
Hemoglobin: 8.3 g/dL — ABNORMAL LOW (ref 13.0–17.0)
MCH: 29.3 pg (ref 26.0–34.0)
MCH: 29.3 pg (ref 26.0–34.0)
MCHC: 34 g/dL (ref 30.0–36.0)
MCHC: 34 g/dL (ref 30.0–36.0)
MCV: 86.3 fL (ref 80.0–100.0)
MCV: 86.2 fL (ref 80.0–100.0)
Platelets: 144 10*3/uL — ABNORMAL LOW (ref 150–400)
Platelets: 131 10*3/uL — ABNORMAL LOW (ref 150–400)
RBC: 3 MIL/uL — ABNORMAL LOW (ref 4.22–5.81)
RBC: 2.83 MIL/uL — ABNORMAL LOW (ref 4.22–5.81)
RDW: 13.6 % (ref 11.5–15.5)
RDW: 13.3 % (ref 11.5–15.5)
WBC: 8.2 10*3/uL (ref 4.0–10.5)
WBC: 5.9 10*3/uL (ref 4.0–10.5)
nRBC: 0 % (ref 0.0–0.2)
nRBC: 0 % (ref 0.0–0.2)

## 2023-12-15 LAB — RENAL FUNCTION PANEL
Albumin: 3.6 g/dL (ref 3.5–5.0)
Anion gap: 8 (ref 5–15)
BUN: 46 mg/dL — ABNORMAL HIGH (ref 8–23)
CO2: 23 mmol/L (ref 22–32)
Calcium: 8.8 mg/dL — ABNORMAL LOW (ref 8.9–10.3)
Chloride: 107 mmol/L (ref 98–111)
Creatinine, Ser: 1.55 mg/dL — ABNORMAL HIGH (ref 0.61–1.24)
GFR, Estimated: 49 mL/min — ABNORMAL LOW (ref >60)
Glucose, Bld: 100 mg/dL — ABNORMAL HIGH (ref 70–99)
Phosphorus: 2.2 mg/dL — ABNORMAL LOW (ref 2.5–4.6)
Potassium: 4.4 mmol/L (ref 3.5–5.1)
Sodium: 138 mmol/L (ref 135–145)

## 2023-12-15 LAB — GLUCOSE, CAPILLARY
Glucose-Capillary: 104 mg/dL — ABNORMAL HIGH (ref 70–99)
Glucose-Capillary: 109 mg/dL — ABNORMAL HIGH (ref 70–99)
Glucose-Capillary: 110 mg/dL — ABNORMAL HIGH (ref 70–99)
Glucose-Capillary: 124 mg/dL — ABNORMAL HIGH (ref 70–99)
Glucose-Capillary: 143 mg/dL — ABNORMAL HIGH (ref 70–99)
Glucose-Capillary: 154 mg/dL — ABNORMAL HIGH (ref 70–99)

## 2023-12-15 NOTE — Progress Notes (Signed)
 Psychosocial Progressive/Outcome: ANOx4, cooperative and agitated at times  Pain/Comfort Progression/Outcome: Pt did not complain of pain during shift   Clinical Progression/Outcome: Tolerated clear liquid diet  Adequate fluid intake  Independent in positioning Voids to urinal as needed  No BM - uses BSC  Slept in between care Maintained safety   NPO at MN for EGD

## 2023-12-15 NOTE — H&P (View-Only) (Signed)
 Daily Progress Note  DOA: 12/13/2023 Hospital Day: 3   Chief Complaint:  Upper GI bleed, severe esophagitis  ASSESSMENT    68 y.o. year old male with a history of diabetes, HLD, PAD, CKD 3b. Admitted 12/13/23 for abdominal pain , N/V, coffee ground emesis. Esophageal wall thickening on imaging.   See GI consult on 4/26.    Upper GI bleed Severe ( LA grade D) reflux esophagitis with bleeding S/p EGD with hemostatic spray. Clotted blood in fundus precluding adequate exam of area.   ABLA Baseline hgb around 14-15 Today:   Hgb declined overnight from 11.6 >> 8.8 in absence of any ongoing overt GI bleeding   Principal Problem:   Hematemesis with nausea Active Problems:   Acute kidney injury superimposed on chronic kidney disease (HCC)   Insulin  dependent type 2 diabetes mellitus (HCC)   Lactic acidosis   History of osteomyelitis   PAD (peripheral artery disease) (HCC)   Anxiety and depression   Essential hypertension   PLAN   --Continue Pantoprazole  40 mg BID --Continue Carafate 1 g AC and at bedtime.  Limit to 7 to 10 days of treatment given renal function --Discussed having repeat EGD tomorrow to get better look at fundus. He is irritated about not being able to eat solids between now and then.  The risks and benefits of EGD with possible biopsies were discussed with the patient who agrees to proceed.  --Clears today, NPO after MN --Trend hgb. The progressive decline is concerning but hopefully will stabilize since not actively bleeding.    Subjective   Irritated about not having solid food. No further hematemesis or any overt GI bleeding   Objective   GI Studies:   EGD 12/13/23 - LA Grade D reflux esophagitis with bleeding. Hemostatic spray applied. - Clotted blood in the gastric fundus. - Normal examined duodenum. - No specimens collected   Recent Labs    12/13/23 0135 12/13/23 0143 12/14/23 0924 12/14/23 1646 12/15/23 0530  WBC 10.2  --  8.2  --   8.2  HGB 15.1   < > 10.2* 9.6* 8.8*  HCT 45.2   < > 30.7* 28.8* 25.9*  MCV 85.8  --  86.5  --  86.3  PLT 224  --  168  --  144*   < > = values in this interval not displayed.   No results for input(s): "FOLATE", "VITAMINB12", "FERRITIN", "TIBC", "IRONPCTSAT" in the last 72 hours. Recent Labs    12/13/23 0135 12/13/23 0143 12/14/23 0924 12/15/23 0530  NA 137 137 137 138  K 4.8 4.7 4.4 4.4  CL 100 104 108 107  CO2 19*  --  21* 23  GLUCOSE 163* 159* 155* 100*  BUN 38* 43* 51* 46*  CREATININE 2.38* 2.50* 1.97* 1.55*  CALCIUM  9.6  --  8.5* 8.8*   Recent Labs    12/13/23 0135 12/15/23 0530  PROT 7.8  --   ALBUMIN 4.3 3.6  AST 31  --   ALT 32  --   ALKPHOS 42  --   BILITOT 0.9  --    Imaging:  CT ABDOMEN PELVIS WO CONTRAST CLINICAL DATA:  Abdominal pain  EXAM: CT ABDOMEN AND PELVIS WITHOUT CONTRAST  TECHNIQUE: Multidetector CT imaging of the abdomen and pelvis was performed following the standard protocol without IV contrast.  RADIATION DOSE REDUCTION: This exam was performed according to the departmental dose-optimization program which includes automated exposure control, adjustment of the mA and/or kV according  to patient size and/or use of iterative reconstruction technique.  COMPARISON:  08/22/2018  FINDINGS: Lower chest: Minimal dependent atelectasis in the bilateral lower lobes.  Hepatobiliary: Unenhanced liver is unremarkable.  Gallbladder is unremarkable. No intrahepatic or extrahepatic ductal dilatation.  Pancreas: Mild parenchymal atrophy.  Spleen: Within normal limits.  Adrenals/Urinary Tract: Adrenal glands are within normal limits.  Kidneys are within normal limits. No renal, ureteral, or bladder calculi. No hydronephrosis.  Bladder is within normal limits.  Stomach/Bowel: Mild wall thickening involving the mid/distal esophagus (image 3), nonspecific, correlate for esophagitis. Associated tiny hiatal hernia.  No evidence of bowel  obstruction.  Normal appendix (image 51).  No colonic wall thickening or inflammatory changes. Mild to moderate left colonic stool burden.  Vascular/Lymphatic: No evidence of abdominal aortic aneurysm.  Atherosclerotic calcifications of the abdominal aorta and branch vessels.  No suspicious abdominopelvic lymphadenopathy.  Reproductive: Prostate is unremarkable.  Other: No abdominopelvic ascites.  Musculoskeletal: Degenerative changes of the visualized thoracolumbar spine. Grade 1 spondylolisthesis at L5-S1.  IMPRESSION: Mild lower esophageal wall thickening, correlate for esophagitis.  Otherwise negative CT abdomen/pelvis.  Electronically Signed   By: Zadie Herter M.D.   On: 12/13/2023 02:46 DG Chest Port 1 View CLINICAL DATA:  Shortness of breath, vomiting  EXAM: PORTABLE CHEST 1 VIEW  COMPARISON:  04/10/2023  FINDINGS: Lungs are clear.  No pleural effusion or pneumothorax.  The heart is normal in size.  IMPRESSION: No acute cardiopulmonary disease.  Electronically Signed   By: Zadie Herter M.D.   On: 12/13/2023 01:55     Scheduled inpatient medications:   sodium chloride    Intravenous Once   atorvastatin   40 mg Oral Daily   insulin  aspart  0-9 Units Subcutaneous Q4H   insulin  glargine-yfgn  10 Units Subcutaneous QHS   pantoprazole  (PROTONIX ) IV  40 mg Intravenous Q12H   polymixin-bacitracin   Topical QODAY   QUEtiapine   50 mg Oral q AM   sucralfate  1 g Oral TID WC & HS   Continuous inpatient infusions:  PRN inpatient medications: fentaNYL  (SUBLIMAZE ) injection, ondansetron  (ZOFRAN ) IV, oxyCODONE   Vital signs in last 24 hours: Temp:  [97.5 F (36.4 C)-98 F (36.7 C)] 97.5 F (36.4 C) (04/28 0350) Pulse Rate:  [102-105] 102 (04/28 0350) Resp:  [18] 18 (04/28 0350) BP: (126-143)/(63-86) 126/63 (04/28 0350) SpO2:  [99 %] 99 % (04/28 0350) Last BM Date : 12/13/23  Intake/Output Summary (Last 24 hours) at 12/15/2023 1423 Last data  filed at 12/15/2023 0500 Gross per 24 hour  Intake --  Output 1251 ml  Net -1251 ml    Intake/Output from previous day: 04/27 0701 - 04/28 0700 In: 1000 [I.V.:750; IV Piggyback:250] Out: 1501 [Urine:1500; Stool:1] Intake/Output this shift: No intake/output data recorded.   Physical Exam:  General: Alert male in NAD Heart:  Regular rate and rhythm.  Pulmonary: Normal respiratory effort Abdomen: Soft, nondistended, nontender. Normal bowel sounds.  Neurologic: Alert and oriented Psych: Pleasant. Cooperative     LOS: 2 days   Mai Schwalbe ,NP 12/15/2023, 2:23 PM

## 2023-12-15 NOTE — Progress Notes (Signed)
 Daily Progress Note  DOA: 12/13/2023 Hospital Day: 3   Chief Complaint:  Upper GI bleed, severe esophagitis  ASSESSMENT    68 y.o. year old male with a history of diabetes, HLD, PAD, CKD 3b. Admitted 12/13/23 for abdominal pain , N/V, coffee ground emesis. Esophageal wall thickening on imaging.   See GI consult on 4/26.    Upper GI bleed Severe ( LA grade D) reflux esophagitis with bleeding S/p EGD with hemostatic spray. Clotted blood in fundus precluding adequate exam of area.   ABLA Baseline hgb around 14-15 Today:   Hgb declined overnight from 11.6 >> 8.8 in absence of any ongoing overt GI bleeding   Principal Problem:   Hematemesis with nausea Active Problems:   Acute kidney injury superimposed on chronic kidney disease (HCC)   Insulin  dependent type 2 diabetes mellitus (HCC)   Lactic acidosis   History of osteomyelitis   PAD (peripheral artery disease) (HCC)   Anxiety and depression   Essential hypertension   PLAN   --Continue Pantoprazole  40 mg BID --Continue Carafate 1 g AC and at bedtime.  Limit to 7 to 10 days of treatment given renal function --Discussed having repeat EGD tomorrow to get better look at fundus. He is irritated about not being able to eat solids between now and then.  The risks and benefits of EGD with possible biopsies were discussed with the patient who agrees to proceed.  --Clears today, NPO after MN --Trend hgb. The progressive decline is concerning but hopefully will stabilize since not actively bleeding.    Subjective   Irritated about not having solid food. No further hematemesis or any overt GI bleeding   Objective   GI Studies:   EGD 12/13/23 - LA Grade D reflux esophagitis with bleeding. Hemostatic spray applied. - Clotted blood in the gastric fundus. - Normal examined duodenum. - No specimens collected   Recent Labs    12/13/23 0135 12/13/23 0143 12/14/23 0924 12/14/23 1646 12/15/23 0530  WBC 10.2  --  8.2  --   8.2  HGB 15.1   < > 10.2* 9.6* 8.8*  HCT 45.2   < > 30.7* 28.8* 25.9*  MCV 85.8  --  86.5  --  86.3  PLT 224  --  168  --  144*   < > = values in this interval not displayed.   No results for input(s): "FOLATE", "VITAMINB12", "FERRITIN", "TIBC", "IRONPCTSAT" in the last 72 hours. Recent Labs    12/13/23 0135 12/13/23 0143 12/14/23 0924 12/15/23 0530  NA 137 137 137 138  K 4.8 4.7 4.4 4.4  CL 100 104 108 107  CO2 19*  --  21* 23  GLUCOSE 163* 159* 155* 100*  BUN 38* 43* 51* 46*  CREATININE 2.38* 2.50* 1.97* 1.55*  CALCIUM  9.6  --  8.5* 8.8*   Recent Labs    12/13/23 0135 12/15/23 0530  PROT 7.8  --   ALBUMIN 4.3 3.6  AST 31  --   ALT 32  --   ALKPHOS 42  --   BILITOT 0.9  --    Imaging:  CT ABDOMEN PELVIS WO CONTRAST CLINICAL DATA:  Abdominal pain  EXAM: CT ABDOMEN AND PELVIS WITHOUT CONTRAST  TECHNIQUE: Multidetector CT imaging of the abdomen and pelvis was performed following the standard protocol without IV contrast.  RADIATION DOSE REDUCTION: This exam was performed according to the departmental dose-optimization program which includes automated exposure control, adjustment of the mA and/or kV according  to patient size and/or use of iterative reconstruction technique.  COMPARISON:  08/22/2018  FINDINGS: Lower chest: Minimal dependent atelectasis in the bilateral lower lobes.  Hepatobiliary: Unenhanced liver is unremarkable.  Gallbladder is unremarkable. No intrahepatic or extrahepatic ductal dilatation.  Pancreas: Mild parenchymal atrophy.  Spleen: Within normal limits.  Adrenals/Urinary Tract: Adrenal glands are within normal limits.  Kidneys are within normal limits. No renal, ureteral, or bladder calculi. No hydronephrosis.  Bladder is within normal limits.  Stomach/Bowel: Mild wall thickening involving the mid/distal esophagus (image 3), nonspecific, correlate for esophagitis. Associated tiny hiatal hernia.  No evidence of bowel  obstruction.  Normal appendix (image 51).  No colonic wall thickening or inflammatory changes. Mild to moderate left colonic stool burden.  Vascular/Lymphatic: No evidence of abdominal aortic aneurysm.  Atherosclerotic calcifications of the abdominal aorta and branch vessels.  No suspicious abdominopelvic lymphadenopathy.  Reproductive: Prostate is unremarkable.  Other: No abdominopelvic ascites.  Musculoskeletal: Degenerative changes of the visualized thoracolumbar spine. Grade 1 spondylolisthesis at L5-S1.  IMPRESSION: Mild lower esophageal wall thickening, correlate for esophagitis.  Otherwise negative CT abdomen/pelvis.  Electronically Signed   By: Zadie Herter M.D.   On: 12/13/2023 02:46 DG Chest Port 1 View CLINICAL DATA:  Shortness of breath, vomiting  EXAM: PORTABLE CHEST 1 VIEW  COMPARISON:  04/10/2023  FINDINGS: Lungs are clear.  No pleural effusion or pneumothorax.  The heart is normal in size.  IMPRESSION: No acute cardiopulmonary disease.  Electronically Signed   By: Zadie Herter M.D.   On: 12/13/2023 01:55     Scheduled inpatient medications:   sodium chloride    Intravenous Once   atorvastatin   40 mg Oral Daily   insulin  aspart  0-9 Units Subcutaneous Q4H   insulin  glargine-yfgn  10 Units Subcutaneous QHS   pantoprazole  (PROTONIX ) IV  40 mg Intravenous Q12H   polymixin-bacitracin   Topical QODAY   QUEtiapine   50 mg Oral q AM   sucralfate  1 g Oral TID WC & HS   Continuous inpatient infusions:  PRN inpatient medications: fentaNYL  (SUBLIMAZE ) injection, ondansetron  (ZOFRAN ) IV, oxyCODONE   Vital signs in last 24 hours: Temp:  [97.5 F (36.4 C)-98 F (36.7 C)] 97.5 F (36.4 C) (04/28 0350) Pulse Rate:  [102-105] 102 (04/28 0350) Resp:  [18] 18 (04/28 0350) BP: (126-143)/(63-86) 126/63 (04/28 0350) SpO2:  [99 %] 99 % (04/28 0350) Last BM Date : 12/13/23  Intake/Output Summary (Last 24 hours) at 12/15/2023 1423 Last data  filed at 12/15/2023 0500 Gross per 24 hour  Intake --  Output 1251 ml  Net -1251 ml    Intake/Output from previous day: 04/27 0701 - 04/28 0700 In: 1000 [I.V.:750; IV Piggyback:250] Out: 1501 [Urine:1500; Stool:1] Intake/Output this shift: No intake/output data recorded.   Physical Exam:  General: Alert male in NAD Heart:  Regular rate and rhythm.  Pulmonary: Normal respiratory effort Abdomen: Soft, nondistended, nontender. Normal bowel sounds.  Neurologic: Alert and oriented Psych: Pleasant. Cooperative     LOS: 2 days   Mai Schwalbe ,NP 12/15/2023, 2:23 PM

## 2023-12-15 NOTE — Plan of Care (Signed)

## 2023-12-15 NOTE — Progress Notes (Addendum)
 [facility]                                                                              Jeremiah Burns, is a 68 y.o. male, DOB - 01-12-1956, WUJ:[id number] Admit date - 12/13/2023    Outpatient Primary MD for the patient is System, Provider Not In  LOS - 2  days  Chief Complaint  Patient presents with   Emesis   Abdominal Pain       Brief summary   Patient is a 68 year old male with poorly controlled IDDM type II, peripheral neuropathy, HLD, PAD presented with nausea vomiting and abdominal pain. Patient was recently hospitalized 4/1-4/11 after presenting with infected diabetic foot ulcer concerning for osteomyelitis.  Vascular surgery was consulted and underwent angioplasty of the left posterior tibial artery on 4/3.  Underwent I&D and bone biopsy with podiatry on 4/4.Interim operative cultures grew Staphylococcus simulans, moderate Diphtheroids, rare Proteus species.  ID was consulted and recommended patient be on Zyvox  600 mg twice daily and Bactrim  twice daily for total 3 weeks, EOT 4/25. Patient reported vomiting for the last 2 to 3 days with coffee-ground emesis, nausea, indigestion with abdominal pain in the lower part of the stomach.  Denied any hematochezia or melena. No NSAID use. NAD, creatinine 2.38, lactic acid 2.2.  Hemoglobin stable initially in ED 15.0  Assessment & Plan    Principal Problem:   Hematemesis, acute upper GI bleed, acute blood loss anemia - Hemoglobin 15.1 on arrival however likely hemoconcentrated due to severe dehydration, nausea and vomiting.  Hemoglobin was 12.7 on 11/28/2023 -CT ab showed mild lower esophageal wall thickening consistent with esophagitis - Patient reports continues to have hematemesis this morning, hemoglobin 10.2 - Continue IV PPI, sucralfate - Continue to hold aspirin , Plavix  - Underwent EGD 4/27 which showed LA grade D reflux esophagitis with bleeding, hemostatic spray applied, clotted blood in the gastric  fundus, normal duodenum.  Received 1 unit packed RBCs in endo. - Hemoglobin 8.8 today, continue H&H every 12 hours - Per GI, possible repeat EGD in 1 to 2 days to evaluate the fundus. - Continue clear liquid diet  Active Problems:   Lactic acidosis -Likely secondary to dehydration, nausea and vomiting -Resolved  Acute kidney injury superimposed on chronic kidney disease stage IIIb -Creatinine 2.38 on admission worsened to 2.5, baseline 1.3-1.6  - Continue to hold lisinopril.   -Continue clears, IV fluids, creatinine improving  Uncontrolled diabetes mellitus type 2 with peripheral neuropathy, IDDM -Hold Jardiance,  - Hemoglobin A1c 8.9 on 11/19/2023 - Currently on clear liquid diet, continue SSI, Semglee  decreased to 10 units daily nightly until patient tolerating solid diet  History of osteomyelitis of the left foot - recently hospitalized 4/1 - 4/11 for osteomyelitis of the left foot status post I&D with bone biopsy).  Cultures grew out multiple species for which patient was placed on Zyvox  and Bactrim  completed 3-week course, EOT 12/12/2023 - Continue wound care - Continue outpatient follow-up with podiatry   PAD  Hyperlipidemia - s/p angioplasty of the left posterior tibial artery on 4/3. - Continue atorvastatin  - Hold aspirin  and Plavix  until cleared to resume by  GI   Essential hypertension - Hold lisinopril due to AKI   Anxiety and depression - Continue Seroquel    Estimated body mass index is 25.1 kg/m as calculated from the following:   Height as of this encounter: 5\' 11"  (1.803 m).   Weight as of this encounter: 81.6 kg.  Code Status: Full code DVT Prophylaxis:  SCDs Start: 12/13/23 0839   Level of Care: Level of care: Telemetry Medical Family Communication: Updated patient Disposition Plan:      Remains inpatient appropriate:      Procedures:  EGD  4/27  Consultants:   GI  Antimicrobials:   Anti-infectives (From admission, onward)    None           Medications  sodium chloride    Intravenous Once   atorvastatin   40 mg Oral Daily   insulin  aspart  0-9 Units Subcutaneous Q4H   insulin  glargine-yfgn  10 Units Subcutaneous QHS   pantoprazole  (PROTONIX ) IV  40 mg Intravenous Q12H   polymixin-bacitracin   Topical QODAY   QUEtiapine   50 mg Oral q AM   sucralfate  1 g Oral TID WC & HS      Subjective:   Jeremiah Burns was seen and examined today.  Somewhat grumpy, wants to eat solid food.  Per patient, no nausea vomiting, no further hematemesis overnight.  No chest pain, shortness of breath, fevers or chills.    Objective:   Vitals:   12/14/23 1250 12/14/23 1320 12/14/23 2035 12/15/23 0350  BP: (!) 123/57 131/73 (!) 143/86 126/63  Pulse: 93 94 (!) 105 (!) 102  Resp: 10 20 18 18   Temp:  (!) 97.5 F (36.4 C) 98 F (36.7 C) (!) 97.5 F (36.4 C)  TempSrc:  Oral Oral Oral  SpO2: 94% 97% 99% 99%  Weight:      Height:        Intake/Output Summary (Last 24 hours) at 12/15/2023 0924 Last data filed at 12/15/2023 0500 Gross per 24 hour  Intake 1000 ml  Output 1501 ml  Net -501 ml     Wt Readings from Last 3 Encounters:  12/13/23 81.6 kg  11/21/23 94.4 kg  05/25/15 86.2 kg   Physical Exam General: Alert and oriented x 3, NAD Cardiovascular: S1 S2 clear, RRR.  Respiratory: CTAB Gastrointestinal: Soft, nontender, nondistended, NBS Ext: no pedal edema bilaterally Neuro: no new deficits Psych: normal affect    Data Reviewed:  I have personally reviewed following labs    CBC Lab Results  Component Value Date   WBC 8.2 12/15/2023   RBC 3.00 (L) 12/15/2023   HGB 8.8 (L) 12/15/2023   HCT 25.9 (L) 12/15/2023   MCV 86.3 12/15/2023   MCH 29.3 12/15/2023   PLT 144 (L) 12/15/2023   MCHC 34.0 12/15/2023   RDW 13.6 12/15/2023   LYMPHSABS 1.5 12/13/2023   MONOABS 0.5 12/13/2023   EOSABS 0.0 12/13/2023   BASOSABS 0.1 12/13/2023     Last metabolic panel Lab Results  Component Value Date   NA 138  12/15/2023   K 4.4 12/15/2023   CL 107 12/15/2023   CO2 23 12/15/2023   BUN 46 (H) 12/15/2023   CREATININE 1.55 (H) 12/15/2023   GLUCOSE 100 (H) 12/15/2023   GFRNONAA 49 (L) 12/15/2023   CALCIUM  8.8 (L) 12/15/2023   PHOS 2.2 (L) 12/15/2023   PROT 7.8 12/13/2023   ALBUMIN 3.6 12/15/2023   BILITOT 0.9 12/13/2023   ALKPHOS 42 12/13/2023   AST 31 12/13/2023   ALT  32 12/13/2023   ANIONGAP 8 12/15/2023    CBG (last 3)  Recent Labs    12/14/23 2037 12/14/23 2356 12/15/23 0352  GLUCAP 152* 154* 110*      Coagulation Profile: No results for input(s): "INR", "PROTIME" in the last 168 hours.   Radiology Studies: I have personally reviewed the imaging studies  No results found.      Bertram Brocks M.D. [facility] 12/15/2023, 9:24 AM  Available via Epic secure chat 7am-7pm After 7 pm, please refer to night coverage provider listed on amion.

## 2023-12-16 ENCOUNTER — Encounter (HOSPITAL_COMMUNITY): Payer: Self-pay

## 2023-12-16 ENCOUNTER — Inpatient Hospital Stay (HOSPITAL_COMMUNITY): Admitting: Anesthesiology

## 2023-12-16 ENCOUNTER — Encounter (HOSPITAL_COMMUNITY)
Admission: EM | Disposition: A | Discharge status: Skilled Nursing Facility | Payer: Self-pay | Source: Home / Self Care | Attending: Internal Medicine

## 2023-12-16 ENCOUNTER — Encounter (HOSPITAL_COMMUNITY): Payer: Self-pay | Admitting: Internal Medicine

## 2023-12-16 DIAGNOSIS — N179 Acute kidney failure, unspecified: Secondary | ICD-10-CM | POA: Diagnosis not present

## 2023-12-16 DIAGNOSIS — R1084 Generalized abdominal pain: Secondary | ICD-10-CM | POA: Diagnosis not present

## 2023-12-16 DIAGNOSIS — K2210 Ulcer of esophagus without bleeding: Secondary | ICD-10-CM | POA: Diagnosis not present

## 2023-12-16 DIAGNOSIS — K2950 Unspecified chronic gastritis without bleeding: Secondary | ICD-10-CM | POA: Diagnosis not present

## 2023-12-16 DIAGNOSIS — K2101 Gastro-esophageal reflux disease with esophagitis, with bleeding: Secondary | ICD-10-CM | POA: Diagnosis not present

## 2023-12-16 DIAGNOSIS — K2970 Gastritis, unspecified, without bleeding: Secondary | ICD-10-CM | POA: Diagnosis not present

## 2023-12-16 DIAGNOSIS — K209 Esophagitis, unspecified without bleeding: Secondary | ICD-10-CM

## 2023-12-16 DIAGNOSIS — K259 Gastric ulcer, unspecified as acute or chronic, without hemorrhage or perforation: Secondary | ICD-10-CM | POA: Diagnosis not present

## 2023-12-16 DIAGNOSIS — K920 Hematemesis: Secondary | ICD-10-CM | POA: Diagnosis not present

## 2023-12-16 HISTORY — PX: ESOPHAGOGASTRODUODENOSCOPY: SHX5428

## 2023-12-16 LAB — RENAL FUNCTION PANEL
Albumin: 3.2 g/dL — ABNORMAL LOW (ref 3.5–5.0)
Anion gap: 7 (ref 5–15)
BUN: 32 mg/dL — ABNORMAL HIGH (ref 8–23)
CO2: 24 mmol/L (ref 22–32)
Calcium: 8.4 mg/dL — ABNORMAL LOW (ref 8.9–10.3)
Chloride: 106 mmol/L (ref 98–111)
Creatinine, Ser: 1.47 mg/dL — ABNORMAL HIGH (ref 0.61–1.24)
GFR, Estimated: 52 mL/min — ABNORMAL LOW (ref >60)
Glucose, Bld: 107 mg/dL — ABNORMAL HIGH (ref 70–99)
Phosphorus: 2.6 mg/dL (ref 2.5–4.6)
Potassium: 4.1 mmol/L (ref 3.5–5.1)
Sodium: 137 mmol/L (ref 135–145)

## 2023-12-16 LAB — CBC
HCT: 24.3 % — ABNORMAL LOW (ref 39.0–52.0)
HCT: 26.9 % — ABNORMAL LOW (ref 39.0–52.0)
Hemoglobin: 8.4 g/dL — ABNORMAL LOW (ref 13.0–17.0)
Hemoglobin: 8.9 g/dL — ABNORMAL LOW (ref 13.0–17.0)
MCH: 29.5 pg (ref 26.0–34.0)
MCH: 28.7 pg (ref 26.0–34.0)
MCHC: 34.6 g/dL (ref 30.0–36.0)
MCHC: 33.1 g/dL (ref 30.0–36.0)
MCV: 85.3 fL (ref 80.0–100.0)
MCV: 86.8 fL (ref 80.0–100.0)
Platelets: 129 10*3/uL — ABNORMAL LOW (ref 150–400)
Platelets: DECREASED 10*3/uL (ref 150–400)
RBC: 2.85 MIL/uL — ABNORMAL LOW (ref 4.22–5.81)
RBC: 3.1 MIL/uL — ABNORMAL LOW (ref 4.22–5.81)
RDW: 13.4 % (ref 11.5–15.5)
RDW: 13.4 % (ref 11.5–15.5)
WBC: 5.7 10*3/uL (ref 4.0–10.5)
WBC: 5.2 10*3/uL (ref 4.0–10.5)
nRBC: 0 % (ref 0.0–0.2)
nRBC: 0 % (ref 0.0–0.2)

## 2023-12-16 LAB — GLUCOSE, CAPILLARY
Glucose-Capillary: 104 mg/dL — ABNORMAL HIGH (ref 70–99)
Glucose-Capillary: 136 mg/dL — ABNORMAL HIGH (ref 70–99)
Glucose-Capillary: 139 mg/dL — ABNORMAL HIGH (ref 70–99)
Glucose-Capillary: 168 mg/dL — ABNORMAL HIGH (ref 70–99)
Glucose-Capillary: 219 mg/dL — ABNORMAL HIGH (ref 70–99)
Glucose-Capillary: 90 mg/dL (ref 70–99)

## 2023-12-16 SURGERY — EGD (ESOPHAGOGASTRODUODENOSCOPY)
Anesthesia: Monitor Anesthesia Care

## 2023-12-16 MED ORDER — SODIUM CHLORIDE 0.9 % IV SOLN: INTRAVENOUS | Status: DC | PRN

## 2023-12-16 MED ORDER — EPHEDRINE SULFATE-NACL 50-0.9 MG/10ML-% IV SOSY
PREFILLED_SYRINGE | INTRAVENOUS | Status: DC | PRN
  Administered 2023-12-16: 5 mg via INTRAVENOUS

## 2023-12-16 MED ORDER — ONDANSETRON HCL 4 MG/2ML IJ SOLN
INTRAMUSCULAR | Status: AC
  Filled 2023-12-16: qty 2

## 2023-12-16 MED ORDER — INSULIN ASPART 100 UNIT/ML IJ SOLN: 0.0000 [IU] | Freq: Every day | INTRAMUSCULAR | Status: DC

## 2023-12-16 MED ORDER — INSULIN ASPART 100 UNIT/ML IJ SOLN
0.0000 [IU] | Freq: Three times a day (TID) | INTRAMUSCULAR | Status: DC
  Administered 2023-12-16: 1 [IU] via SUBCUTANEOUS
  Administered 2023-12-17 (×2): 2 [IU] via SUBCUTANEOUS

## 2023-12-16 MED ORDER — PROPOFOL 10 MG/ML IV BOLUS
INTRAVENOUS | Status: DC | PRN
  Administered 2023-12-16: 30 mg via INTRAVENOUS
  Administered 2023-12-16 (×2): 20 mg via INTRAVENOUS
  Administered 2023-12-16: 30 mg via INTRAVENOUS
  Administered 2023-12-16: 20 mg via INTRAVENOUS
  Administered 2023-12-16: 50 mg via INTRAVENOUS

## 2023-12-16 MED ORDER — LIDOCAINE 2% (20 MG/ML) 5 ML SYRINGE
INTRAMUSCULAR | Status: DC | PRN
  Administered 2023-12-16: 100 mg via INTRAVENOUS

## 2023-12-16 MED ORDER — GLYCOPYRROLATE 0.2 MG/ML IJ SOLN
INTRAMUSCULAR | Status: DC | PRN
  Administered 2023-12-16: .1 mg via INTRAVENOUS

## 2023-12-16 MED ORDER — PHENYLEPHRINE HCL (PRESSORS) 10 MG/ML IV SOLN
INTRAVENOUS | Status: DC | PRN
  Administered 2023-12-16 (×5): 80 ug via INTRAVENOUS

## 2023-12-16 NOTE — Op Note (Signed)
 [facility] Patient Name: Jeremiah Burns Procedure Date : 12/16/2023 MRN: [id number] Attending MD: Nannette Babe , MD, [id number] Date of Birth: July 23, 1956 CSN: [id number] Age: 68 Admit Type: Inpatient Procedure:                Upper GI endoscopy Indications:              Acute post hemorrhagic anemia, Hematemesis, recent                            EGD with esophagitis requiring control of bleeding                            though unable to clear the fundus due to clotted                            blood Providers:                Amber Bail. Bridgett Camps, MD, Franco Isaac, RN, Nicki Barnacle,                            Technician Referring MD:             [facility] Medicines:                Monitored Anesthesia Care Complications:            No immediate complications. Estimated Blood Loss:     Estimated blood loss was minimal. Procedure:                Pre-Anesthesia Assessment:                           - Prior to the procedure, a History and Physical                            was performed, and patient medications and                            allergies were reviewed. The patient's tolerance of                            previous anesthesia was also reviewed. The risks                            and benefits of the procedure and the sedation                            options and risks were discussed with the patient.                            All questions were answered, and informed consent                            was obtained. Prior Anticoagulants: The patient has  taken Plavix  (clopidogrel ), last dose was 4 days                            prior to procedure. ASA Grade Assessment: III - A                            patient with severe systemic disease. After                            reviewing the risks and benefits, the patient was                            deemed in satisfactory condition to undergo the                             procedure.                           After obtaining informed consent, the endoscope was                            passed under direct vision. Throughout the                            procedure, the patient's blood pressure, pulse, and                            oxygen saturations were monitored continuously. The                            [id number]-H190 ([id number]) Olympus endoscope was introduced                            through the mouth, and advanced to the second part                            of duodenum. The upper GI endoscopy was                            accomplished without difficulty. The patient                            tolerated the procedure well. Scope In: Scope Out: Findings:      LA Grade D (one or more mucosal breaks involving at least 75% of       esophageal circumference) esophagitis with no bleeding was found in the       lower third of the esophagus. No evidence of active or recent rebleeding.      Scattered mild inflammation characterized by erythema and granularity       was found in the gastric fundus and in the gastric body. Biopsies were       taken with a cold forceps for histology and Helicobacter pylori testing.      The examined duodenum was normal. Impression:               -  LA Grade D ulcerative esophagitis with no active                            bleeding.                           - Mild, scattered, non-bleeding gastritis. Biopsied                            to exclude H. Pylori.                           - Normal examined duodenum. Moderate Sedation:      N/A Recommendation:           - Return patient to hospital ward for ongoing care.                           - Advance diet as tolerated.                           - Continue present medications. BID PPI is                            recommended x 8 weeks and then once daily                            indefinitely.                           - Await pathology results.                            - Would hold Plavix  at least another 7 days to                            allow more time for esophagitis to heal. Would be                            okay for low dose ASA today.                           - GI will sign off, call if questions. Procedure Code(s):        --- Professional ---                           [phone], Esophagogastroduodenoscopy, flexible,                            transoral; with biopsy, single or multiple Diagnosis Code(s):        --- Professional ---                           K20.90, Esophagitis, unspecified without bleeding                           K29.70, Gastritis, unspecified,  without bleeding                           D62, Acute posthemorrhagic anemia                           K92.0, Hematemesis CPT copyright 2022 American [employer] Association. All rights reserved. The codes documented in this report are preliminary and upon coder review may  be revised to meet current compliance requirements. Nannette Babe, MD 12/16/2023 11:02:02 AM This report has been signed electronically. Number of Addenda: 0

## 2023-12-16 NOTE — Progress Notes (Signed)
 Patient is back after procedure at 1220pm. Will continue to monitor

## 2023-12-16 NOTE — Progress Notes (Signed)
 Pt called out to front desk and yelled, "Tell someone to shut my damn door!" I heard him from down the hall and went to his door and asked him politely not to speak to our staff that way, as I was closing his door. He then yelled back at me, "just cancel my procedure today and get me the hell out of here!" I asked him, "Do you want me to let the doctor know you don't want the procedure then? Are you actually refusing?" He said, "yes! I am tired of being bothered. Get me out of here." I then messaged, Alvis Jourdain, MD to let him know of the patients decision.   Pt also refusing finger stick blood sugar and vitals this AM.  Pt asleep in room. Will continue to monitor.

## 2023-12-16 NOTE — Anesthesia Postprocedure Evaluation (Signed)
 Anesthesia Post Note  Patient: Jeremiah Burns  Procedure(s) Performed: EGD (ESOPHAGOGASTRODUODENOSCOPY)     Patient location during evaluation: PACU Anesthesia Type: MAC Level of consciousness: awake and alert Pain management: pain level controlled Vital Signs Assessment: post-procedure vital signs reviewed and stable Respiratory status: spontaneous breathing, nonlabored ventilation and respiratory function stable Cardiovascular status: stable and blood pressure returned to baseline Anesthetic complications: no   No notable events documented.  Last Vitals:  Vitals:   12/16/23 1125 12/16/23 1218  BP: 114/65 124/72  Pulse: 83 75  Resp: 10 17  Temp:  (!) 36.4 C  SpO2:      Last Pain:  Vitals:   12/16/23 1218  TempSrc: Oral  PainSc:                  Juventino Oppenheim

## 2023-12-16 NOTE — TOC Initial Note (Signed)
 Transition of Care [facility]) - Initial/Assessment Note    Patient Details  Name: Jeremiah Burns MRN: [id number] Date of Birth: 05-Jun-1956  [facility] CM/SW Contact:    Brooklinn Longbottom A [place], LCSW Phone Number: 12/16/2023, 1:45 PM  Clinical Narrative:                  CSW made attempt to complete assessment, pt stated that he wanted CSW to come back at another time.   Per chart review, pt is from [facility] for short term rehab. CSW reached out to Kia and she confirmed pt is from facility with plan to return once stable. Will need insurance authorization to return to rehab.   CSW to start insurance authorization close to pt's medical stability. Pt will need updated PT/OT evaluation to complete authorization. Provider notified.    TOC will continue to follow.   Expected Discharge Plan: Skilled Nursing Facility Barriers to Discharge: Continued Medical Work up   Patient Goals and CMS Choice            Expected Discharge Plan and Services       Living arrangements for the past 2 months: Single Family Home, Skilled Nursing Facility                                      Prior Living Arrangements/Services Living arrangements for the past 2 months: Single Family Home, Skilled Nursing Facility Lives with:: Self   Do you feel safe going back to the place where you live?: Yes      Need for Family Participation in Patient Care: No (Comment) Care giver support system in place?: No (comment)      Activities of Daily Living      Permission Sought/Granted                  Emotional Assessment Appearance:: Appears older than stated age Attitude/Demeanor/Rapport: Guarded Affect (typically observed): Flat Orientation: : Oriented to Self, Oriented to Place, Oriented to  Time, Oriented to Situation Alcohol / Substance Use: Not Applicable Psych Involvement: No (comment)  Admission diagnosis:  Coffee ground emesis [K92.0] Patient Active Problem  List   Diagnosis Date Noted   Acute esophagitis 12/16/2023   Gastritis without bleeding 12/16/2023   Hematemesis with nausea 12/13/2023   Lactic acidosis 12/13/2023   History of osteomyelitis 12/13/2023   PAD (peripheral artery disease) (HCC) 12/13/2023   Anxiety and depression 12/13/2023   Essential hypertension 12/13/2023   Bursitis of left foot 11/21/2023   Diabetic foot infection (HCC) 11/19/2023   Hyponatremia 11/19/2023   Acute kidney injury superimposed on chronic kidney disease (HCC) 11/19/2023   Insulin  dependent type 2 diabetes mellitus (HCC) 11/19/2023   Pyogenic inflammation of bone (HCC) 11/19/2023   PCP:  System, Provider Not In Pharmacy:   [facility] - [place], [place] - [address] [place] [address] Phone: [phone] Fax: [phone]     Social Drivers of Health (SDOH) Social History: SDOH Screenings   Food Insecurity: No Food Insecurity (12/13/2023)  Housing: Low Risk  (12/13/2023)  Transportation Needs: No Transportation Needs (12/13/2023)  Utilities: Not At Risk (12/13/2023)  Recent Concern: Utilities - At Risk (11/19/2023)  Financial Resource Strain: Not at Risk (08/07/2023)   Received from [facility]  Physical Activity: At Risk (08/07/2023)   Received from [facility]  Social Connections: Moderately Isolated (12/13/2023)  Stress: Not at Risk (  08/07/2023)   Received from [facility]  Tobacco Use: Low Risk  (12/16/2023)   SDOH Interventions:     Readmission Risk Interventions     No data to display

## 2023-12-16 NOTE — Progress Notes (Signed)
 [facility]                                                                              Jeremiah Burns, is a 68 y.o. male, DOB - 12-10-1955, ZOX:[id number] Admit date - 12/13/2023    Outpatient Primary MD for the patient is System, Provider Not In  LOS - 3  days  Chief Complaint  Patient presents with   Emesis   Abdominal Pain       Brief summary   Patient is a 68 year old male with poorly controlled IDDM type II, peripheral neuropathy, HLD, PAD presented with nausea vomiting and abdominal pain. Patient was recently hospitalized 4/1-4/11 after presenting with infected diabetic foot ulcer concerning for osteomyelitis.  Vascular surgery was consulted and underwent angioplasty of the left posterior tibial artery on 4/3.  Underwent I&D and bone biopsy with podiatry on 4/4.Interim operative cultures grew Staphylococcus simulans, moderate Diphtheroids, rare Proteus species.  ID was consulted and recommended patient be on Zyvox  600 mg twice daily and Bactrim  twice daily for total 3 weeks, EOT 4/25. Patient reported vomiting for the last 2 to 3 days with coffee-ground emesis, nausea, indigestion with abdominal pain in the lower part of the stomach.  Denied any hematochezia or melena. No NSAID use. NAD, creatinine 2.38, lactic acid 2.2.  Hemoglobin stable initially in ED 15.0  Assessment & Plan    Principal Problem:   Hematemesis, acute upper GI bleed, acute blood loss anemia - Hemoglobin 15.1 on arrival however likely hemoconcentrated due to severe dehydration, nausea and vomiting.  Hemoglobin was 12.7 on 11/28/2023 -CT ab showed mild lower esophageal wall thickening consistent with esophagitis - Patient reports continues to have hematemesis this morning, hemoglobin 10.2 - Continue IV PPI, sucralfate - Continue to hold aspirin , Plavix  - Underwent EGD 4/27 which showed LA grade D reflux esophagitis with bleeding, hemostatic spray applied, clotted blood in the gastric  fundus, normal duodenum.  Received 1 unit packed RBCs in endo. - No further bleeding, H&H stable, hemoglobin 8.4  - Repeat EGD today  Active Problems:   Lactic acidosis -Likely secondary to dehydration, nausea and vomiting -Resolved  Acute kidney injury superimposed on chronic kidney disease stage IIIb -Creatinine 2.38 on admission worsened to 2.5, baseline 1.3-1.6  - Continue to hold lisinopril.   - Improving, within baseline  Uncontrolled diabetes mellitus type 2 with peripheral neuropathy, IDDM -Hold Jardiance,  - Hemoglobin A1c 8.9 on 11/19/2023 - Currently on clear liquid diet, continue SSI, Semglee  decreased to 10 units daily nightly until patient tolerating solid diet  History of osteomyelitis of the left foot - recently hospitalized 4/1 - 4/11 for osteomyelitis of the left foot status post I&D with bone biopsy).  Cultures grew out multiple species for which patient was placed on Zyvox  and Bactrim  completed 3-week course, EOT 12/12/2023 - Continue wound care - Continue outpatient follow-up with podiatry   PAD  Hyperlipidemia - s/p angioplasty of the left posterior tibial artery on 4/3. - Continue atorvastatin  - Hold aspirin  and Plavix  until cleared to resume by GI   Essential hypertension - Hold lisinopril due to AKI   Anxiety and depression - Continue  Seroquel    Estimated body mass index is 25.1 kg/m as calculated from the following:   Height as of this encounter: 5\' 11"  (1.803 m).   Weight as of this encounter: 81.6 kg.  Code Status: Full code DVT Prophylaxis:  SCDs Start: 12/13/23 0839   Level of Care: Level of care: Telemetry Medical Family Communication: Updated patient Disposition Plan:      Remains inpatient appropriate:      Procedures:  EGD  4/27  Consultants:   GI  Antimicrobials:   Anti-infectives (From admission, onward)    None          Medications  [MAR Hold] sodium chloride    Intravenous Once   [MAR Hold] atorvastatin   40 mg  Oral Daily   [MAR Hold] insulin  aspart  0-9 Units Subcutaneous Q4H   [MAR Hold] insulin  glargine-yfgn  10 Units Subcutaneous QHS   [MAR Hold] pantoprazole  (PROTONIX ) IV  40 mg Intravenous Q12H   [MAR Hold] polymixin-bacitracin   Topical QODAY   [MAR Hold] QUEtiapine   50 mg Oral q AM   [MAR Hold] sucralfate  1 g Oral TID WC & HS      Subjective:   Jeremiah Burns was seen and examined today.  Overnight events noted, per patient he was frustrated as he could not eat and sleep, door was left open.  This morning he is calmer and understood the rationale behind repeat EGD.  Did not decline EGD.  Objective:   Vitals:   12/15/23 2015 12/16/23 0013 12/16/23 0722 12/16/23 1011  BP:  110/62 126/66 127/73  Pulse: 74 79 83 81  Resp: 18 18 18 19   Temp: 98.6 F (37 C) 97.9 F (36.6 C) 97.8 F (36.6 C) 97.6 F (36.4 C)  TempSrc: Oral Oral Oral Temporal  SpO2: 100% 100% 97% 97%  Weight:    81.6 kg  Height:    5\' 11"  (1.803 m)   No intake or output data in the 24 hours ending 12/16/23 1024    Wt Readings from Last 3 Encounters:  12/16/23 81.6 kg  11/21/23 94.4 kg  05/25/15 86.2 kg    Physical Exam General: Alert and oriented x 3, NAD Cardiovascular: S1 S2 clear, RRR.  Respiratory: CTAB Gastrointestinal: Soft, nontender, nondistended, NBS Ext: no pedal edema bilaterally Neuro: no new deficits Psych: flat affect    Data Reviewed:  I have personally reviewed following labs    CBC Lab Results  Component Value Date   WBC 5.7 12/16/2023   RBC 2.85 (L) 12/16/2023   HGB 8.4 (L) 12/16/2023   HCT 24.3 (L) 12/16/2023   MCV 85.3 12/16/2023   MCH 29.5 12/16/2023   PLT 129 (L) 12/16/2023   MCHC 34.6 12/16/2023   RDW 13.4 12/16/2023   LYMPHSABS 1.5 12/13/2023   MONOABS 0.5 12/13/2023   EOSABS 0.0 12/13/2023   BASOSABS 0.1 12/13/2023     Last metabolic panel Lab Results  Component Value Date   NA 137 12/16/2023   K 4.1 12/16/2023   CL 106 12/16/2023   CO2 24  12/16/2023   BUN 32 (H) 12/16/2023   CREATININE 1.47 (H) 12/16/2023   GLUCOSE 107 (H) 12/16/2023   GFRNONAA 52 (L) 12/16/2023   CALCIUM  8.4 (L) 12/16/2023   PHOS 2.6 12/16/2023   PROT 7.8 12/13/2023   ALBUMIN 3.2 (L) 12/16/2023   BILITOT 0.9 12/13/2023   ALKPHOS 42 12/13/2023   AST 31 12/13/2023   ALT 32 12/13/2023   ANIONGAP 7 12/16/2023    CBG (last  3)  Recent Labs    12/15/23 1503 12/15/23 2019 12/16/23 0012  GLUCAP 143* 124* 139*      Coagulation Profile: No results for input(s): "INR", "PROTIME" in the last 168 hours.   Radiology Studies: I have personally reviewed the imaging studies  No results found.      Jeremiah Burns M.D. [facility] 12/16/2023, 10:24 AM  Available via Epic secure chat 7am-7pm After 7 pm, please refer to night coverage provider listed on amion.

## 2023-12-16 NOTE — Progress Notes (Signed)
 Patient was taken for the procedure at [facility]

## 2023-12-16 NOTE — Plan of Care (Signed)
  Problem: Education: Goal: Knowledge of General Education information will improve Description: Including pain rating scale, medication(s)/side effects and non-pharmacologic comfort measures Outcome: Progressing   Problem: Health Behavior/Discharge Planning: Goal: Ability to manage health-related needs will improve Outcome: Progressing   Problem: Coping: Goal: Level of anxiety will decrease Outcome: Progressing   Problem: Elimination: Goal: Will not experience complications related to bowel motility Outcome: Progressing Goal: Will not experience complications related to urinary retention Outcome: Progressing   Problem: Pain Managment: Goal: General experience of comfort will improve and/or be controlled Outcome: Progressing   Problem: Safety: Goal: Ability to remain free from injury will improve Outcome: Progressing   Problem: Skin Integrity: Goal: Risk for impaired skin integrity will decrease Outcome: Progressing   Problem: Skin Integrity: Goal: Risk for impaired skin integrity will decrease Outcome: Progressing   Problem: Tissue Perfusion: Goal: Adequacy of tissue perfusion will improve Outcome: Progressing

## 2023-12-16 NOTE — Progress Notes (Signed)
 Patient refused to continue cardiac monitor. Made the provider aware.

## 2023-12-16 NOTE — Progress Notes (Signed)
Patient refused SCD

## 2023-12-16 NOTE — Care Management Important Message (Signed)
 Important Message  Patient Details  Name: Jeremiah Burns MRN: [id number] Date of Birth: 12/05/1955   Important Message Given:  Yes - Medicare IM     Wynonia Hedges 12/16/2023, 2:25 PM

## 2023-12-16 NOTE — Transfer of Care (Signed)
 Immediate Anesthesia Transfer of Care Note  Patient: Jeremiah Burns  Procedure(s) Performed: EGD (ESOPHAGOGASTRODUODENOSCOPY)  Patient Location: PACU  Anesthesia Type:MAC  Level of Consciousness: drowsy  Airway & Oxygen Therapy: Patient Spontanous Breathing  Post-op Assessment: Report given to RN and Post -op Vital signs reviewed and stable  Post vital signs: Reviewed and stable  Last Vitals:  Vitals Value Taken Time  BP 80/50 12/16/23 1106  Temp    Pulse 84 12/16/23 1105  Resp 14 12/16/23 1106  SpO2 99 % 12/16/23 1105  Vitals shown include unfiled device data.  Last Pain:  Vitals:   12/16/23 1011  TempSrc: Temporal  PainSc: 8       Patients Stated Pain Goal: 0 (12/14/23 0505)  Complications: No notable events documented.

## 2023-12-16 NOTE — Plan of Care (Signed)

## 2023-12-16 NOTE — Anesthesia Preprocedure Evaluation (Addendum)
 Anesthesia Evaluation  Patient identified by MRN, date of birth, ID band Patient awake    Reviewed: Allergy & Precautions, NPO status , Patient's Chart, lab work & pertinent test results  History of Anesthesia Complications Negative for: history of anesthetic complications  Airway Mallampati: II  TM Distance: >3 FB Neck ROM: Full    Dental  (+) Dental Advisory Given   Pulmonary neg pulmonary ROS   Pulmonary exam normal        Cardiovascular hypertension, Pt. on medications + Peripheral Vascular Disease  Normal cardiovascular exam     Neuro/Psych  PSYCHIATRIC DISORDERS Anxiety Depression    negative neurological ROS     GI/Hepatic negative GI ROS, Neg liver ROS,,,  Endo/Other  diabetes, Type 2, Oral Hypoglycemic Agents, Insulin  Dependent    Renal/GU CRFRenal disease     Musculoskeletal negative musculoskeletal ROS (+)    Abdominal   Peds  Hematology  (+) Blood dyscrasia, anemia  Plt 129k    Anesthesia Other Findings Sizeable ulcer on left foot, plantar surface   Reproductive/Obstetrics                             Anesthesia Physical Anesthesia Plan  ASA: 3  Anesthesia Plan: MAC   Post-op Pain Management: Minimal or no pain anticipated   Induction:   PONV Risk Score and Plan: 1 and Propofol  infusion and Treatment may vary due to age or medical condition  Airway Management Planned: Nasal Cannula and Natural Airway  Additional Equipment: None  Intra-op Plan:   Post-operative Plan:   Informed Consent: I have reviewed the patients History and Physical, chart, labs and discussed the procedure including the risks, benefits and alternatives for the proposed anesthesia with the patient or authorized representative who has indicated his/her understanding and acceptance.       Plan Discussed with: CRNA and Anesthesiologist  Anesthesia Plan Comments:        Anesthesia  Quick Evaluation

## 2023-12-16 NOTE — Interval H&P Note (Signed)
 History and Physical Interval Note: Repeat EGD for recent hematemesis, esophagitis but unable to clear fundus at time of last procedure The nature of the procedure, as well as the risks, benefits, and alternatives were carefully and thoroughly reviewed with the patient. Ample time for discussion and questions allowed. The patient understood, was satisfied, and agreed to proceed.      Latest Ref Rng & Units 12/16/2023    4:43 AM 12/15/2023    5:58 PM 12/15/2023    5:30 AM  CBC  WBC 4.0 - 10.5 K/uL 5.7  5.9  8.2   Hemoglobin 13.0 - 17.0 g/dL 8.4  8.3  8.8   Hematocrit 39.0 - 52.0 % 24.3  24.4  25.9   Platelets 150 - 400 K/uL 129  131  144      12/16/2023 10:28 AM  Fawn Hooks Bronkema  has presented today for surgery, with the diagnosis of hematemesis, acute esophagitis.  The various methods of treatment have been discussed with the patient and family. After consideration of risks, benefits and other options for treatment, the patient has consented to  Procedure(s): EGD (ESOPHAGOGASTRODUODENOSCOPY) (N/A) as a surgical intervention.  The patient's history has been reviewed, patient examined, no change in status, stable for surgery.  I have reviewed the patient's chart and labs.  Questions were answered to the patient's satisfaction.     Amber Bail Raquan Iannone

## 2023-12-17 ENCOUNTER — Inpatient Hospital Stay: Admitting: Family

## 2023-12-17 ENCOUNTER — Encounter (HOSPITAL_COMMUNITY): Payer: Self-pay | Admitting: Gastroenterology

## 2023-12-17 DIAGNOSIS — K920 Hematemesis: Secondary | ICD-10-CM | POA: Diagnosis not present

## 2023-12-17 DIAGNOSIS — N179 Acute kidney failure, unspecified: Secondary | ICD-10-CM | POA: Diagnosis not present

## 2023-12-17 DIAGNOSIS — R1084 Generalized abdominal pain: Secondary | ICD-10-CM | POA: Diagnosis not present

## 2023-12-17 DIAGNOSIS — E119 Type 2 diabetes mellitus without complications: Secondary | ICD-10-CM | POA: Diagnosis not present

## 2023-12-17 LAB — CBC
HCT: 27 % — ABNORMAL LOW (ref 39.0–52.0)
Hemoglobin: 9 g/dL — ABNORMAL LOW (ref 13.0–17.0)
MCH: 28.5 pg (ref 26.0–34.0)
MCHC: 33.3 g/dL (ref 30.0–36.0)
MCV: 85.4 fL (ref 80.0–100.0)
Platelets: 156 10*3/uL (ref 150–400)
RBC: 3.16 MIL/uL — ABNORMAL LOW (ref 4.22–5.81)
RDW: 13.2 % (ref 11.5–15.5)
WBC: 6.8 10*3/uL (ref 4.0–10.5)
nRBC: 0 % (ref 0.0–0.2)

## 2023-12-17 LAB — RENAL FUNCTION PANEL
Albumin: 3.5 g/dL (ref 3.5–5.0)
Anion gap: 6 (ref 5–15)
BUN: 24 mg/dL — ABNORMAL HIGH (ref 8–23)
CO2: 25 mmol/L (ref 22–32)
Calcium: 9 mg/dL (ref 8.9–10.3)
Chloride: 107 mmol/L (ref 98–111)
Creatinine, Ser: 1.36 mg/dL — ABNORMAL HIGH (ref 0.61–1.24)
GFR, Estimated: 57 mL/min — ABNORMAL LOW (ref >60)
Glucose, Bld: 170 mg/dL — ABNORMAL HIGH (ref 70–99)
Phosphorus: 2.3 mg/dL — ABNORMAL LOW (ref 2.5–4.6)
Potassium: 4.2 mmol/L (ref 3.5–5.1)
Sodium: 138 mmol/L (ref 135–145)

## 2023-12-17 LAB — GLUCOSE, CAPILLARY
Glucose-Capillary: 135 mg/dL — ABNORMAL HIGH (ref 70–99)
Glucose-Capillary: 186 mg/dL — ABNORMAL HIGH (ref 70–99)
Glucose-Capillary: 198 mg/dL — ABNORMAL HIGH (ref 70–99)
Glucose-Capillary: 190 mg/dL — ABNORMAL HIGH (ref 70–99)

## 2023-12-17 LAB — SURGICAL PATHOLOGY

## 2023-12-17 MED ORDER — PANTOPRAZOLE SODIUM 40 MG PO TBEC
40.0000 mg | DELAYED_RELEASE_TABLET | Freq: Two times a day (BID) | ORAL | Status: AC
Start: 2023-12-17 — End: ?

## 2023-12-17 MED ORDER — DOUBLE ANTIBIOTIC 500-10000 UNIT/GM EX OINT: 1.0000 | TOPICAL_OINTMENT | CUTANEOUS | Status: DC

## 2023-12-17 MED ORDER — OXYCODONE HCL 5 MG PO TABS: 5.0000 mg | ORAL_TABLET | Freq: Four times a day (QID) | ORAL | 0 refills | Status: DC | PRN

## 2023-12-17 MED ORDER — SUCRALFATE 1 GM/10ML PO SUSP: 1.0000 g | Freq: Three times a day (TID) | ORAL | Status: DC

## 2023-12-17 MED ORDER — ASPIRIN 81 MG PO TBEC
81.0000 mg | DELAYED_RELEASE_TABLET | Freq: Every day | ORAL | Status: DC
  Administered 2023-12-17: 81 mg via ORAL
  Filled 2023-12-17: qty 1

## 2023-12-17 MED ORDER — PANTOPRAZOLE SODIUM 40 MG PO TBEC
40.0000 mg | DELAYED_RELEASE_TABLET | Freq: Two times a day (BID) | ORAL | Status: DC
  Administered 2023-12-17: 40 mg via ORAL
  Filled 2023-12-17 (×2): qty 1

## 2023-12-17 MED ORDER — INSULIN GLARGINE-YFGN 100 UNIT/ML ~~LOC~~ SOLN
10.0000 [IU] | Freq: Two times a day (BID) | SUBCUTANEOUS | Status: DC
  Filled 2023-12-17: qty 0.1

## 2023-12-17 NOTE — Evaluation (Signed)
 Physical Therapy Evaluation Patient Details Name: Jeremiah Burns MRN: [id number] DOB: 23-May-1956 Today's Date: 12/17/2023  History of Present Illness  Pt is a 68 y/o M presenting to ED on 4/26 from [facility] with vomiting/constipation. Admitted with hematemesis 2/2 severe reflux esophagitis/mallory-weiss tear and AKI. 4/27 upper GI endoscopy. 4/28 EGD. 4/29 EGD. Recent admit 4/1-4/11 with L diabetic foot ulcer. PMHx: DMT2, HLD, PAD, CKD 3b, peripheral neuropathy   Clinical Impression  Pt in bed upon arrival and agreeable to PT eval. PTA, pt was at [facility] and was working on "hopping" and utilizing WC. Pt has decreased safety awareness and needs cues to adhere to LLE NWB precautions throughout session. He originally attempted a step-pivot transfer with no AD with CGA for safety. Upon second attempt, pt was able to maintain WB precautions with RW and multiple cues. Pt lives alone and has no available assist. Pt would benefit from acute skilled PT to address functional impairments. Recommending post-acute rehab <3hrs to work towards independence with mobility. Acute PT to follow.         If plan is discharge home, recommend the following: A lot of help with walking and/or transfers;A lot of help with bathing/dressing/bathroom;Assistance with cooking/housework;Assist for transportation;Help with stairs or ramp for entrance   Can travel by private vehicle   Yes    Equipment Recommendations Wheelchair (measurements PT);Wheelchair cushion (measurements PT);BSC/3in1;Rolling walker (2 wheels)     Functional Status Assessment Patient has had a recent decline in their functional status and demonstrates the ability to make significant improvements in function in a reasonable and predictable amount of time.     Precautions / Restrictions Precautions Precautions: Fall Recall of Precautions/Restrictions: Impaired Precaution/Restrictions Comments: needs cues for LLE  NWB Required Braces or Orthoses: Other Brace Other Brace: post-op shoe on L foot, pt reports shoe is at SNF Restrictions Weight Bearing Restrictions Per Provider Order: Yes LLE Weight Bearing Per Provider Order: Non weight bearing Other Position/Activity Restrictions: NWB in post op shoe per prior orders, no post op shoe present at time of evaluation      Mobility  Bed Mobility Overal bed mobility: Needs Assistance Bed Mobility: Sit to Supine, Supine to Sit    Supine to sit: Supervision Sit to supine: Supervision      Transfers Overall transfer level: Needs assistance Equipment used: Rolling walker (2 wheels) Transfers: Sit to/from Stand, Bed to chair/wheelchair/BSC Sit to Stand: Supervision, Contact guard assist   Step pivot transfers: Contact guard assist     General transfer comment: originally performed step-pivot transfer with no AD and WB on L heel. With cues, able to hop with RW while maintaining L LE NWB    Ambulation/Gait    General Gait Details: pt declined    Balance Overall balance assessment: Needs assistance, Mild deficits observed, not formally tested Sitting-balance support: No upper extremity supported, Feet supported Sitting balance-Leahy Scale: Good     Standing balance support: Bilateral upper extremity supported, Reliant on assistive device for balance Standing balance-Leahy Scale: Poor Standing balance comment: reliant on RW         Pertinent Vitals/Pain Pain Assessment Pain Assessment: Faces Faces Pain Scale: Hurts even more Pain Location: L foot Pain Descriptors / Indicators: Sore, Grimacing, Discomfort, Moaning Pain Intervention(s): Limited activity within patient's tolerance, Monitored during session, Repositioned    Home Living Family/patient expects to be discharged to:: Private residence Living Arrangements: Alone   Type of Home: House Home Access: Stairs to enter Entrance Stairs-Rails: Can reach both  Entrance Stairs-Number of  Steps: 3+2   Home Layout: One level Home Equipment: Cane - single point;Shower seat - built in;Grab bars - tub/shower;Hand held shower head Additional Comments: recently d/c'd to [facility] on 4/11.    Prior Function Prior Level of Function : Independent/Modified Independent;Driving     Mobility Comments: hops and uses w/c ADLs Comments: ind with ADL [facility], set up A for bathing     Extremity/Trunk Assessment   Upper Extremity Assessment Upper Extremity Assessment: Defer to OT evaluation    Lower Extremity Assessment Lower Extremity Assessment: LLE deficits/detail LLE Deficits / Details: at least 3/5, deferred MMT due to pain. Able to SLR LLE Sensation: history of peripheral neuropathy (Alert to light touch, reports occasional numbness/tingling)    Cervical / Trunk Assessment Cervical / Trunk Assessment: Normal  Communication   Communication Communication: No apparent difficulties    Cognition Arousal: Alert Behavior During Therapy: Flat affect   PT - Cognitive impairments: Safety/Judgement  PT - Cognition Comments: decreased safety awareness with WB on L LE despite cues for precautions. Pt states that he does what he wants. Following commands: Intact       Cueing Cueing Techniques: Verbal cues, Tactile cues, Gestural cues     General Comments General comments (skin integrity, edema, etc.): VSS on RA, gauze on L foot     PT Assessment Patient needs continued PT services  PT Problem List Decreased activity tolerance;Decreased strength;Decreased balance;Decreased mobility;Decreased knowledge of use of DME;Decreased safety awareness;Decreased knowledge of precautions;Pain       PT Treatment Interventions DME instruction;Gait training;Stair training;Functional mobility training;Therapeutic activities;Therapeutic exercise;Balance training;Neuromuscular re-education;Patient/family education    PT Goals (Current goals can be found in the Care Plan section)  Acute Rehab PT  Goals Patient Stated Goal: to get better and go home PT Goal Formulation: With patient Time For Goal Achievement: 12/31/23 Potential to Achieve Goals: Fair    Frequency Min 1X/week     Co-evaluation   Reason for Co-Treatment: Complexity of the patient's impairments (multi-system involvement);Necessary to address cognition/behavior during functional activity;For patient/therapist safety;To address functional/ADL transfers PT goals addressed during session: Mobility/safety with mobility;Balance;Proper use of DME OT goals addressed during session: ADL's and self-care       AM-PAC PT "6 Clicks" Mobility  Outcome Measure Help needed turning from your back to your side while in a flat bed without using bedrails?: A Little Help needed moving from lying on your back to sitting on the side of a flat bed without using bedrails?: A Little Help needed moving to and from a bed to a chair (including a wheelchair)?: A Little Help needed standing up from a chair using your arms (e.g., wheelchair or bedside chair)?: A Little Help needed to walk in hospital room?: Total Help needed climbing 3-5 steps with a railing? : Total 6 Click Score: 14    End of Session   Activity Tolerance: Patient tolerated treatment well Patient left: in bed;with call bell/phone within reach;with bed alarm set Nurse Communication: Mobility status PT Visit Diagnosis: Other abnormalities of gait and mobility (R26.89);Muscle weakness (generalized) (M62.81)    Time: 1610-9604 PT Time Calculation (min) (ACUTE ONLY): 13 min   Charges:   PT Evaluation $PT Eval Low Complexity: 1 Low   PT General Charges $$ ACUTE PT VISIT: 1 Visit         Jeremiah Burns, PT, DPT Secure Chat Preferred  Rehab Office [phone]   Jeremiah Burns 12/17/2023, 10:04 AM

## 2023-12-17 NOTE — Evaluation (Signed)
 Occupational Therapy Evaluation Patient Details Name: Jeremiah Burns MRN: [id number] DOB: 1956-07-26 Today's Date: 12/17/2023   History of Present Illness   Pt is a 67 y/o M presenting to ED on 4/26 from [facility] with vomiting/constipation. Recent admit 4/1-4/11 with L diabetic foot ulcer     Clinical Impressions Pt reports "hopping' and utilizing w/c at SNF for the past few weeks, has had light assist for ADLs, prior to SNF pt was home alone and has no assist. Pt currently with decr awareness of deficits/precautions needs min cues to follow NWB precautions for LLE with transfers, Pt overall needing up to min A for ADLs, supervision for bed mobility and supervision - CGA for transfers with RW. Pt presenting with impairments listed below, will follow acutely. Patient will benefit from continued inpatient follow up therapy, <3 hours/day to maximize safety/ind with ADL/functional mobility.      If plan is discharge home, recommend the following:   A little help with walking and/or transfers;A little help with bathing/dressing/bathroom;Assistance with cooking/housework;Help with stairs or ramp for entrance     Functional Status Assessment   Patient has had a recent decline in their functional status and demonstrates the ability to make significant improvements in function in a reasonable and predictable amount of time.     Equipment Recommendations   BSC/3in1;Wheelchair (measurements OT);Wheelchair cushion (measurements OT);Tub/shower seat     Recommendations for Other Services   PT consult     Precautions/Restrictions   Precautions Precautions: Fall Recall of Precautions/Restrictions: Impaired Restrictions Weight Bearing Restrictions Per Provider Order: Yes LLE Weight Bearing Per Provider Order: Non weight bearing Other Position/Activity Restrictions: NWB in post op shoe per prior orders, no post op shoe present at time of evaluation     Mobility Bed  Mobility Overal bed mobility: Needs Assistance Bed Mobility: Sit to Supine, Supine to Sit     Supine to sit: Supervision Sit to supine: Supervision        Transfers Overall transfer level: Needs assistance Equipment used: Rolling walker (2 wheels) Transfers: Sit to/from Stand, Bed to chair/wheelchair/BSC Sit to Stand: Supervision, Contact guard assist Stand pivot transfers: Contact guard assist, Supervision         General transfer comment: pt able to adhere to WB precautions with min-mod cues      Balance Overall balance assessment: Needs assistance, Mild deficits observed, not formally tested Sitting-balance support: No upper extremity supported, Feet supported Sitting balance-Leahy Scale: Good     Standing balance support: Bilateral upper extremity supported, Reliant on assistive device for balance Standing balance-Leahy Scale: Poor Standing balance comment: Reliant on RW and external support due to WB restrictions and instability                           ADL either performed or assessed with clinical judgement   ADL Overall ADL's : Needs assistance/impaired Eating/Feeding: Set up;Sitting   Grooming: Set up;Sitting   Upper Body Bathing: Supervision/ safety;Sitting   Lower Body Bathing: Contact guard assist;Sitting/lateral leans   Upper Body Dressing : Supervision/safety;Sitting   Lower Body Dressing: Contact guard assist;Sitting/lateral leans;Sit to/from stand   Toilet Transfer: Contact guard assist;Rolling walker (2 wheels);BSC/3in1;Cueing for safety;Cueing for sequencing;Stand-pivot   Toileting- Clothing Manipulation and Hygiene: Minimal assistance;Sitting/lateral lean       Functional mobility during ADLs: Contact guard assist;Rolling walker (2 wheels)       Vision Baseline Vision/History: 1 Wears glasses Vision Assessment?: No apparent visual deficits  Perception Perception: Not tested       Praxis Praxis: Not tested        Pertinent Vitals/Pain Pain Assessment Pain Assessment: Faces Pain Score: 5  Faces Pain Scale: Hurts even more Pain Location: L foot Pain Descriptors / Indicators: Sore, Grimacing, Discomfort, Moaning Pain Intervention(s): Limited activity within patient's tolerance, Monitored during session, Repositioned     Extremity/Trunk Assessment Upper Extremity Assessment Upper Extremity Assessment: Overall WFL for tasks assessed   Lower Extremity Assessment Lower Extremity Assessment: Defer to PT evaluation   Cervical / Trunk Assessment Cervical / Trunk Assessment: Normal   Communication Communication Communication: No apparent difficulties   Cognition Arousal: Alert Behavior During Therapy: Flat affect Cognition: Cognition impaired             OT - Cognition Comments: decr awareness of safety/precautions                 Following commands: Intact       Cueing  General Comments   Cueing Techniques: Verbal cues;Tactile cues;Gestural cues  VSS   Exercises     Shoulder Instructions      Home Living Family/patient expects to be discharged to:: Private residence Living Arrangements: Alone   Type of Home: House Home Access: Stairs to enter Secretary/administrator of Steps: 3+2 Entrance Stairs-Rails: Can reach both Home Layout: One level     Bathroom Shower/Tub: Producer, television/film/video: Standard Bathroom Accessibility: Yes How Accessible: Accessible via walker Home Equipment: Cane - single point;Shower seat - built in;Grab bars - tub/shower;Hand held shower head   Additional Comments: recently d/c'd to [facility] on 4/11.      Prior Functioning/Environment Prior Level of Function : Independent/Modified Independent;Driving             Mobility Comments: hops and uses w/c ADLs Comments: ind with ADL [facility], set up A for bathing    OT Problem List: Decreased activity tolerance;Impaired balance (sitting and/or standing);Decreased safety  awareness;Decreased knowledge of use of DME or AE;Decreased knowledge of precautions;Pain   OT Treatment/Interventions: Self-care/ADL training;Therapeutic exercise;DME and/or AE instruction;Therapeutic activities;Patient/family education;Balance training      OT Goals(Current goals can be found in the care plan section)   Acute Rehab OT Goals Patient Stated Goal: none stated OT Goal Formulation: With patient Time For Goal Achievement: 12/31/23 Potential to Achieve Goals: Fair ADL Goals Pt Will Perform Upper Body Dressing: with modified independence;sitting Pt Will Perform Lower Body Dressing: with modified independence;sitting/lateral leans;sit to/from stand Pt Will Transfer to Toilet: with modified independence;stand pivot transfer;squat pivot transfer;bedside commode Pt Will Perform Tub/Shower Transfer: Tub transfer;Shower transfer;ambulating;shower seat Additional ADL Goal #1: Pt will follow LLE precautions with min cues in prep for ADLs   OT Frequency:  Min 2X/week    Co-evaluation PT/OT/SLP Co-Evaluation/Treatment: Yes Reason for Co-Treatment: Complexity of the patient's impairments (multi-system involvement);Necessary to address cognition/behavior during functional activity;For patient/therapist safety;To address functional/ADL transfers   OT goals addressed during session: ADL's and self-care      AM-PAC OT "6 Clicks" Daily Activity     Outcome Measure Help from another person eating meals?: A Little Help from another person taking care of personal grooming?: A Little Help from another person toileting, which includes using toliet, bedpan, or urinal?: A Little Help from another person bathing (including washing, rinsing, drying)?: A Little Help from another person to put on and taking off regular upper body clothing?: A Little Help from another person to put on and taking off regular lower body  clothing?: A Lot 6 Click Score: 17   End of Session Equipment Utilized  During Treatment: Gait belt;Rolling walker (2 wheels) Nurse Communication: Mobility status  Activity Tolerance: Patient tolerated treatment well Patient left: in bed;with call bell/phone within reach;with bed alarm set  OT Visit Diagnosis: Unsteadiness on feet (R26.81);Other abnormalities of gait and mobility (R26.89);Pain Pain - Right/Left: Left Pain - part of body: Ankle and joints of foot                Time: 8413-2440 OT Time Calculation (min): 14 min Charges:  OT General Charges $OT Visit: 1 Visit OT Evaluation $OT Eval Low Complexity: 1 Low  Benedict Brain, OTD, OTR/L SecureChat Preferred Acute Rehab [phone]   Benedict Brain Koonce 12/17/2023, 9:54 AM

## 2023-12-17 NOTE — Plan of Care (Signed)

## 2023-12-17 NOTE — TOC Transition Note (Signed)
 Transition of Care [facility]) - Discharge Note   Patient Details  Name: Jeremiah Burns MRN: [id number] Date of Birth: 1955/08/29  Transition of [facility]) CM/SW Contact:  Zayna Toste A [place], LCSW Phone Number: 12/17/2023, 2:12 PM   Clinical Narrative:     Patient will DC to: [facility]  Anticipated DC date: 12/17/23  Family notified: Pt declined  Transport by: Dortha Gauss ID [id number] Reference ID [id number]   Approval dates:  12/17/2023-12/19/2023      Per MD patient ready for DC to [facility]. RN, patient, patient's family, and facility notified of DC. Discharge Summary and FL2 sent to facility. RN to call report prior to discharge [phone], Room ). DC packet on chart. Ambulance transport requested for patient.     CSW will sign off for now as social work intervention is no longer needed. Please consult us  again if new needs arise.   Final next level of care: Skilled Nursing Facility Barriers to Discharge: Barriers Resolved   Patient Goals and CMS Choice            Discharge Placement              Patient chooses bed at: [facility] Patient to be transferred to facility by: [facility] Name of family member notified: Pt declined Patient and family notified of of transfer: 12/17/23  Discharge Plan and Services Additional resources added to the After Visit Summary for                                       Social Drivers of Health (SDOH) Interventions SDOH Screenings   Food Insecurity: No Food Insecurity (12/13/2023)  Housing: Low Risk  (12/13/2023)  Transportation Needs: No Transportation Needs (12/13/2023)  Utilities: Not At Risk (12/13/2023)  Recent Concern: Utilities - At Risk (11/19/2023)  Financial Resource Strain: Not at Risk (08/07/2023)   Received from [facility]  Physical Activity: At Risk (08/07/2023)   Received from [facility]  Social Connections: Moderately Isolated (12/13/2023)  Stress: Not at Risk (08/07/2023)    Received from [facility]  Tobacco Use: Low Risk  (12/16/2023)     Readmission Risk Interventions     No data to display

## 2023-12-17 NOTE — Discharge Summary (Signed)
 Physician Discharge Summary   Patient: Jeremiah Burns MRN: [id number] DOB: 01-Mar-1956  Admit date:     12/13/2023  Discharge date: 12/17/23  Discharge Physician: Bertram Brocks, MD    PCP: System, Provider Not In   Recommendations at discharge:    Protonix  twice daily for 8 weeks then once daily indefinitely.  Sucralfate 1gm TID and HS x 4 weeks  Resume Aspirin  81mg  daily  Hold Plavix  for 1 week.   Discharge Diagnoses:    Hematemesis with nausea  Acute esophagitis   Gastritis without bleeding   Lactic acidosis   Acute kidney injury superimposed on chronic kidney disease3b (HCC)   Insulin  dependent type 2 diabetes mellitus (HCC)   History of osteomyelitis   PAD (peripheral artery disease) (HCC)   Essential hypertension   Anxiety and depression    Hospital Course: Patient is a 68 year old male with poorly controlled IDDM type II, peripheral neuropathy, HLD, PAD presented with nausea vomiting and abdominal pain. Patient was recently hospitalized 4/1-4/11 after presenting with infected diabetic foot ulcer concerning for osteomyelitis.  Vascular surgery was consulted and underwent angioplasty of the left posterior tibial artery on 4/3.  Underwent I&D and bone biopsy with podiatry on 4/4.Interim operative cultures grew Staphylococcus simulans, moderate Diphtheroids, rare Proteus species.  ID was consulted and recommended patient be on Zyvox  600 mg twice daily and Bactrim  twice daily for total 3 weeks, EOT 4/25. Patient reported vomiting for the last 2 to 3 days with coffee-ground emesis, nausea, indigestion with abdominal pain in the lower part of the stomach.  Denied any hematochezia or melena. No NSAID use. NAD, creatinine 2.38, lactic acid 2.2.  Hemoglobin stable initially in ED 15.0   Assessment and Plan:  Hematemesis, acute upper GI bleed, acute blood loss anemia - Hemoglobin 15.1 on arrival however likely hemoconcentrated due to severe dehydration, nausea and vomiting.   Hemoglobin was 12.7 on 11/28/2023 -CT ab showed mild lower esophageal wall thickening consistent with esophagitis - Patient was placed on IV PPI and sucralfate - Aspirin , Plavix  were held and GI consulted. - Underwent EGD 4/27 which showed LA grade D reflux esophagitis with bleeding, hemostatic spray applied, clotted blood in the gastric fundus, normal duodenum.  Received 1 unit packed RBCs in endo. - No further bleeding, H&H stable - Repeat EGD on 4/29 showed LA grade D ulcerative esophagitis with no active bleeding, mild scattered nonbleeding gastritis, biopsied.  Normal duodenum.  Recommended twice daily PPI for 8 weeks then once daily indefinitely.  Okay to resume aspirin  however would hold Plavix  for at least another 7 days to allow more time for esophagitis to hold. - Outpatient follow-up with GI in 3-4 weeks. - Aspirin  started, H&H stable 9.0 at discharge      Lactic acidosis -Likely secondary to dehydration, nausea and vomiting -Resolved   Acute kidney injury superimposed on chronic kidney disease stage IIIb -Creatinine 2.38 on admission worsened to 2.5, baseline 1.3-1.6  - Back at baseline 1.3   Uncontrolled diabetes mellitus type 2 with peripheral neuropathy, IDDM -Hold Jardiance, actos - Hemoglobin A1c 8.9 on 11/19/2023 - Started back on carb modified diet - Continue sliding scale insulin , sensitive, Semglee  12 units twice daily   History of osteomyelitis of the left foot - recently hospitalized 4/1 - 4/11 for osteomyelitis of the left foot status post I&D with bone biopsy).  Cultures grew out multiple species for which patient was placed on Zyvox  and Bactrim  completed 3-week course, EOT 12/12/2023 - Continue wound care - Continue outpatient follow-up  with podiatry   PAD  Hyperlipidemia - s/p angioplasty of the left posterior tibial artery on 4/3. - Continue atorvastatin  - Resumed aspirin , continue to hold Plavix  for 1 week   Essential hypertension - BP improving    Anxiety and depression - Continue Seroquel      Estimated body mass index is 25.1 kg/m as calculated from the following:   Height as of this encounter: 5\' 11"  (1.803 m).   Weight as of this encounter: 81.6 kg.       Pain control - [place]  Controlled Substance Reporting System database was reviewed. and patient was instructed, not to drive, operate heavy machinery, perform activities at heights, swimming or participation in water activities or provide baby-sitting services while on Pain, Sleep and Anxiety Medications; until their outpatient Physician has advised to do so again. Also recommended to not to take more than prescribed Pain, Sleep and Anxiety Medications.  Consultants: GI  Procedures performed: EGD x 2  Disposition: Skilled nursing facility Diet recommendation:  Discharge Diet Orders (From admission, onward)     Start     Ordered   12/17/23 0000  Diet Carb Modified        12/17/23 1415           Carb modified diet DISCHARGE MEDICATION: Allergies as of 12/17/2023   No Known Allergies      Medication List     PAUSE taking these medications    clopidogrel  75 MG tablet Wait to take this until: Dec 25, 2023 Commonly known as: PLAVIX  Take 1 tablet (75 mg total) by mouth daily with breakfast.       STOP taking these medications    empagliflozin 25 MG Tabs tablet Commonly known as: JARDIANCE   linezolid  600 MG tablet Commonly known as: ZYVOX    pioglitazone 15 MG tablet Commonly known as: ACTOS   sulfamethoxazole -trimethoprim  800-160 MG tablet Commonly known as: BACTRIM  DS       TAKE these medications    acetaminophen  500 MG tablet Commonly known as: TYLENOL  Take 1,000 mg by mouth in the morning and at bedtime. For 4 days   aspirin  EC 81 MG tablet Take 1 tablet (81 mg total) by mouth daily. Swallow whole.   atorvastatin  40 MG tablet Commonly known as: LIPITOR Take 1 tablet (40 mg total) by mouth daily.   feeding supplement  Liqd Take 237 mLs by mouth 2 (two) times daily between meals.   HumaLOG KwikPen 100 UNIT/ML KwikPen Generic drug: insulin  lispro Inject 0-12 Units into the skin 4 (four) times daily -  before meals and at bedtime. Per sliding scale, if 71-110= 0,  111-150= 3,  151-200= 4,  201-250= 6,  251-300= 8,  301-350= 10,  351-400= 12   lisinopril 10 MG tablet Commonly known as: ZESTRIL Take 1 tablet by mouth daily.   ondansetron  4 MG tablet Commonly known as: ZOFRAN  Take 4 mg by mouth every 8 (eight) hours as needed for nausea or vomiting.   oxyCODONE  5 MG immediate release tablet Commonly known as: Oxy IR/ROXICODONE  Take 1 tablet (5 mg total) by mouth every 6 (six) hours as needed for moderate pain (pain score 4-6).   pantoprazole  40 MG tablet Commonly known as: PROTONIX  Take 1 tablet (40 mg total) by mouth 2 (two) times daily before a meal. Continue Protonix  40 mg twice daily for 8 weeks, then continue 40 mg daily indefinitely   polyethylene glycol 17 g packet Commonly known as: MIRALAX  / GLYCOLAX  Take 17 g by mouth  daily as needed.   polymixin-bacitracin 500-10000 UNIT/GM Oint ointment Apply 1 Application topically every other day. Apply to left foot wound Start taking on: Dec 18, 2023   QUEtiapine  50 MG tablet Commonly known as: SEROQUEL  Take 50 mg by mouth in the morning.   Semglee  (yfgn) 100 UNIT/ML Pen Generic drug: insulin  glargine-yfgn Inject 12 Units into the skin 2 (two) times daily.   senna-docusate 8.6-50 MG tablet Commonly known as: Senokot-S Take 1 tablet by mouth 2 (two) times daily.   sucralfate 1 GM/10ML suspension Commonly known as: CARAFATE Take 10 mLs (1 g total) by mouth 4 (four) times daily -  with meals and at bedtime.               Discharge Care Instructions  (From admission, onward)           Start     Ordered   12/17/23 0000  Discharge wound care:       Comments: Wound care  Every other day    Comments: Apply antibiotic ointment to the  left foot wound, top  with xeroform and dry dressing.   12/17/23 1415            Contact information for follow-up providers     Pyrtle, Amber Bail, MD. Schedule an appointment as soon as possible for a visit in 4 week(s).   Specialty: Gastroenterology Why: for hospital follow-up Contact information: [address] [place] [address] [phone]              Contact information for after-discharge care     Destination     [facility] Preferred SNF .   Service: Skilled Nursing Contact information: [address] [place]  [address] [phone]                    Discharge Exam: Cleavon Curls Weights   12/13/23 0122 12/16/23 1011  Weight: 81.6 kg 81.6 kg   S: tolerating diet, no further bleeding.  BP (!) 153/84 (BP Location: Left Arm)   Pulse 99   Temp 98.6 F (37 C)   Resp 16   Ht 5\' 11"  (1.803 m)   Wt 81.6 kg   SpO2 94%   BMI 25.10 kg/m   Physical Exam General: Alert and oriented x 3, NAD Cardiovascular: S1 S2 clear, RRR.  Respiratory: CTAB, no wheezing Gastrointestinal: Soft, nontender, nondistended, NBS Ext: no pedal edema bilaterally Neuro: no new deficits Psych: Normal affect    Condition at discharge: fair  The results of significant diagnostics from this hospitalization (including imaging, microbiology, ancillary and laboratory) are listed below for reference.   Imaging Studies: CT ABDOMEN PELVIS WO CONTRAST Result Date: 12/13/2023 CLINICAL DATA:  Abdominal pain EXAM: CT ABDOMEN AND PELVIS WITHOUT CONTRAST TECHNIQUE: Multidetector CT imaging of the abdomen and pelvis was performed following the standard protocol without IV contrast. RADIATION DOSE REDUCTION: This exam was performed according to the departmental dose-optimization program which includes automated exposure control, adjustment of the mA and/or kV according to patient size and/or use of iterative reconstruction technique. COMPARISON:  08/22/2018  FINDINGS: Lower chest: Minimal dependent atelectasis in the bilateral lower lobes. Hepatobiliary: Unenhanced liver is unremarkable. Gallbladder is unremarkable. No intrahepatic or extrahepatic ductal dilatation. Pancreas: Mild parenchymal atrophy. Spleen: Within normal limits. Adrenals/Urinary Tract: Adrenal glands are within normal limits. Kidneys are within normal limits. No renal, ureteral, or bladder calculi. No hydronephrosis. Bladder is within normal limits. Stomach/Bowel: Mild wall thickening involving the mid/distal esophagus (image 3),  nonspecific, correlate for esophagitis. Associated tiny hiatal hernia. No evidence of bowel obstruction. Normal appendix (image 51). No colonic wall thickening or inflammatory changes. Mild to moderate left colonic stool burden. Vascular/Lymphatic: No evidence of abdominal aortic aneurysm. Atherosclerotic calcifications of the abdominal aorta and branch vessels. No suspicious abdominopelvic lymphadenopathy. Reproductive: Prostate is unremarkable. Other: No abdominopelvic ascites. Musculoskeletal: Degenerative changes of the visualized thoracolumbar spine. Grade 1 spondylolisthesis at L5-S1. IMPRESSION: Mild lower esophageal wall thickening, correlate for esophagitis. Otherwise negative CT abdomen/pelvis. Electronically Signed   By: Zadie Herter M.D.   On: 12/13/2023 02:46   DG Chest Port 1 View Result Date: 12/13/2023 CLINICAL DATA:  Shortness of breath, vomiting EXAM: PORTABLE CHEST 1 VIEW COMPARISON:  04/10/2023 FINDINGS: Lungs are clear.  No pleural effusion or pneumothorax. The heart is normal in size. IMPRESSION: No acute cardiopulmonary disease. Electronically Signed   By: Zadie Herter M.D.   On: 12/13/2023 01:55   DG Foot 2 Views Left Result Date: 11/21/2023 CLINICAL DATA:  Postop. EXAM: LEFT FOOT - 2 VIEW COMPARISON:  Preoperative imaging FINDINGS: Presumed antibiotic beads project over the distal first metatarsal and metatarsal-phalangeal joint with  overlying skin staples in place. Irregularity of the first metatarsal phalangeal joint is similar in appearance to preoperative radiograph. No evidence of acute fracture. IMPRESSION: Presumed antibiotic beads project over the distal first metatarsal and metatarsal-phalangeal joint. Electronically Signed   By: Chadwick Colonel M.D.   On: 11/21/2023 16:03   PERIPHERAL VASCULAR CATHETERIZATION Result Date: 11/20/2023 Images from the original result were not included.   Patient name: Dhaval Radcliff         MRN: [id number]        DOB: March 07, 1956          Sex: male  11/20/2023 Pre-operative Diagnosis: Critical limb ischemia of left lower extremity with tissue loss Post-operative diagnosis:  Same Surgeon:  Young Hensen, MD Procedure Performed: 1.  Ultrasound-guided access right common femoral artery 2.  Aortogram with catheter selection of aorta 3.  Left lower extremity angiogram with catheter selection of the left Popliteal artery and TP trunk 4.  Left posterior tibial artery angioplasty (3 mm x 60 mm Sterling) 5.  Left above-knee popliteal artery shockwave angioplasty (5 mm x 80 mm shockwave x 200 pulses) 6.  Mynx closure of the right common femoral artery 7.  51 minutes of monitored moderate conscious sedation time  Indications: Patient is a 68 year old male seen with tissue loss in his left lower extremity.  Vascular surgery was consulted for evaluation of inflow for wound healing.  He presents for lower extremity arteriogram after risks-benefit discussed.  Findings:  Ultrasound-guided access right common femoral artery.  Aortogram showed patent infrarenal aorta with patent renals and patent SMA.  Both iliacs were patent without flow-limiting stenosis.   On the left he had a patent common femoral, profunda, and SFA without flow-limiting stenosis.  The above-knee popliteal artery had a focal 70% calcified stenosis.  Dominant tibial runoff was in the peroneal that was patent and gave a large collateral at the  ankle to the DP.  The anterior tibial is patent proximally but then occludes in the distal calf.  The posterior tibial had a proximal 99% focal stenosis with the more distal vessel filling retrograde.  Ultimately I got down the left posterior tibial artery antegrade and this was treated with a 3 mm x 60 mm Sterling to nominal pressure for 2 minutes.  Much more brisk filling of the PT antegrade with now two-vessel  runoff in the peroneal and posterior tibial.  The above-knee popliteal stenosis was treated with shockwave lithotripsy x 200 pulses using a 5 mm shockwave balloon.             Procedure:  The patient was identified in the holding area and taken to room 8.  The patient was then placed supine on the table and prepped and draped in the usual sterile fashion.  A time out was called.  The patient received Versed  and fentanyl  for conscious moderate sedation.  Vital signs were monitored including heart rate, respiratory rate, oxygenation and blood pressure.  I was present for all of moderate sedation.  Ultrasound was used to evaluate the right common femoral artery.  It was patent .  A digital ultrasound image was acquired.  A micropuncture needle was used to access the right common femoral artery under ultrasound guidance.  An 018 wire was advanced without resistance and a micropuncture sheath was placed.  The 018 wire was removed and a benson wire was placed.  The micropuncture sheath was exchanged for a 5 french sheath.  An omniflush catheter was advanced over the wire to the level of L-1.  An abdominal angiogram was obtained.  Next, using the omniflush catheter and a benson wire, the aortic bifurcation was crossed and the catheter was placed into theleft external iliac artery and left runoff was obtained.  Ultimately elected for intervention.  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  A Glidewire advantage was used with the Omni Flush catheter down the left SFA and upsized to a long 5 [place]  catapult sheath in the right groin over the aortic bifurcation.  The patient was given 100 units/kg IV heparin .  I then went down the left SFA antegrade and got a catheter and wire into the popliteal artery and TP trunk with hand injection.  I was able to get down the posterior tibial antegrade through the high-grade stenosis with a V18 wire.  The proximal PT was treated with a 3 mm x 60 Miller Sterling for 2 minutes.  Much better flow down the PT.  I then went to the above-knee popliteal artery and this was treated with shockwave lithotripsy after changing for Spartacore wire with a 6 mm x 80 mm shockwave lithotripsy balloon initially inflated to 2 atm and I did a total of 80 pulses.  I then inflated to 4 atm and did 120 pulses for 200 pulses total.  Optimized with excellent runoff with preserved runoff at completion.  Short 5 French sheath was placed in the right groin with a minx closure device.  Plan: Excellent results.  Optimized from vascular surgery standpoint.  Inline flow with two-vessel runoff.  Aspirin  Plavix  statin.   Young Hensen, MD [facility] Office: [phone]   VAS US  ABI WITH/WO TBI Result Date: 11/19/2023  LOWER EXTREMITY DOPPLER STUDY Patient Name:  TRAEDEN WORSHAM  Date of Exam:   11/19/2023 Medical Rec #: [id number]         Accession #:    [id number] Date of Birth: [birth year]         Patient Gender: M Patient Age:   52 years Exam Location:  [facility] Procedure:      VAS US  ABI WITH/WO TBI Referring Phys: Corky Diener RATHORE --------------------------------------------------------------------------------  Indications: Ulceration, and peripheral artery disease. High Risk Factors: Diabetes.  Comparison Study: No prior exam. Performing Technologist: Ria Chad  Examination Guidelines: A complete evaluation includes at  minimum, Doppler waveform signals and systolic blood pressure reading at the level of bilateral brachial, anterior tibial,  and posterior tibial arteries, when vessel segments are accessible. Bilateral testing is considered an integral part of a complete examination. Photoelectric Plethysmograph (PPG) waveforms and toe systolic pressure readings are included as required and additional duplex testing as needed. Limited examinations for reoccurring indications may be performed as noted.  ABI Findings: +---------+------------------+-----+---------+--------+ Right    Rt Pressure (mmHg)IndexWaveform Comment  +---------+------------------+-----+---------+--------+ Brachial 133                    triphasic         +---------+------------------+-----+---------+--------+ PTA      255               1.88 biphasic          +---------+------------------+-----+---------+--------+ DP       117               0.86 biphasic          +---------+------------------+-----+---------+--------+ Great Toe73                0.54 Abnormal          +---------+------------------+-----+---------+--------+ +---------+------------------+-----+----------+-------+ Left     Lt Pressure (mmHg)IndexWaveform  Comment +---------+------------------+-----+----------+-------+ Brachial 136                    triphasic         +---------+------------------+-----+----------+-------+ PTA      153               1.12 biphasic          +---------+------------------+-----+----------+-------+ DP       160               1.18 monophasic        +---------+------------------+-----+----------+-------+ Great Toe53                0.39 Abnormal          +---------+------------------+-----+----------+-------+ +-------+-----------+-----------+------------+------------+ ABI/TBIToday's ABIToday's TBIPrevious ABIPrevious TBI +-------+-----------+-----------+------------+------------+ Right  0.86       0.54                                +-------+-----------+-----------+------------+------------+ Left   1.18       0.39                                 +-------+-----------+-----------+------------+------------+  Summary: Right: Resting right ankle-brachial index indicates mild right lower extremity arterial disease. The right toe-brachial index is abnormal. Left: Resting left ankle-brachial index is within normal range. The left toe-brachial index is abnormal. *See table(s) above for measurements and observations.  Electronically signed by Genny Kid MD on 11/19/2023 at 7:01:11 PM.    Final    MR FOOT LEFT W WO CONTRAST Result Date: 11/19/2023 CLINICAL DATA:  Plantar ulceration of left forefoot overlying the first MTP joint. EXAM: MRI OF THE LEFT FOREFOOT WITHOUT AND WITH CONTRAST TECHNIQUE: Multiplanar, multisequence MR imaging of the left forefoot. Was performed both before and after administration of intravenous contrast. CONTRAST:  8mL GADAVIST  GADOBUTROL  1 MMOL/ML IV SOLN COMPARISON:  Left foot radiographs dated 11/18/2023. MRI of the left toes dated 04/11/2023. FINDINGS: Bones/Joint/Cartilage Plantar ulceration overlying the first MTP joint. There is marginal spurring of the underlying first MTP joint with similar erosion or chronic osteochondral lesion of  the base of the first proximal phalanx, slightly increased articular surface irregularity with trace endosteal edema at the distal articular margin of the first metatarsal head and the proximal articular margin of the first proximal phalanx. There is trace fluid within the first MTP joint with mild enhancement. These findings could reflect mild progression of advanced degenerative arthropathy of the first MTP joint, however, septic arthritis can not be excluded. No definite evidence of underlying osteomyelitis. The remainder of the visualized bones demonstrate normal marrow signal. 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 Ligaments Lisfranc ligament is intact. Muscles and Tendons Thickening and increased signal is again noted involving the flexor hallucis longus  tendon, most notably at the level of the hallux sesamoid. The remainder of the flexor and extensor tendons are intact. Intrinsic muscles of the foot demonstrate mild atrophy and diffuse edema. Soft tissue Plantar ulceration overlying the first MTP joint with associated foci of susceptibility artifact, which may relate to interval debridement. There is surrounding soft tissue edema and enhancement, compatible with cellulitis. No discrete focal fluid collection identified. Subcutaneous edema extending along the dorsal mid to lateral forefoot. IMPRESSION: 1. 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 No definite evidence of osteomyelitis. 2. Plantar ulceration of the forefoot, overlying the first MTP joint, with findings concerning for surrounding cellulitis and associated foci of susceptibility artifact which could relate to interval debridement. No abscess identified. 3. Similar marked flexor hallucis longus tendinosis at the level of the lateral hallux sesamoid. 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 4. Mild atrophy and edema of the intrinsic musculature of the foot, myositis can not be excluded. Electronically Signed   By: Mannie Seek M.D.   On: 11/19/2023 08:34   DG Foot Complete Left Result Date: 11/18/2023 Please see detailed radiograph report in office note.   Microbiology: Results for orders placed or performed during the hospital encounter of 11/18/23  Blood culture (routine x 2)     Status: Abnormal   Collection Time: 11/19/23 12:34 AM   Specimen: BLOOD RIGHT ARM  Result Value Ref Range Status   Specimen Description BLOOD RIGHT ARM  Final   Special Requests   Final    BOTTLES DRAWN AEROBIC AND ANAEROBIC Blood Culture adequate volume   Culture  Setup Time   Final    GRAM NEGATIVE RODS AEROBIC BOTTLE ONLY CRITICAL RESULT CALLED TO, READ BACK BY  AND VERIFIED WITH: PHARMD Landa [place] [id number] @2122  FH    Culture (A)  Final    SPHINGOMONAS PAUCIMOBILIS FAILED TO GROW FOR SUSCEPTIBILITIES Performed at [facility], [address], [place], [place] [address]    Report Status 11/28/2023 FINAL  Final  Blood culture (routine x 2)     Status: None   Collection Time: 11/19/23 12:34 AM   Specimen: BLOOD LEFT ARM  Result Value Ref Range Status   Specimen Description BLOOD LEFT ARM  Final   Special Requests   Final    BOTTLES DRAWN AEROBIC AND ANAEROBIC Blood Culture adequate volume   Culture   Final    NO GROWTH 5 DAYS Performed at [facility], [address], [place], [place] [address]    Report Status 11/24/2023 FINAL  Final  Blood Culture ID Panel (Reflexed)     Status: None   Collection Time: 11/19/23 12:34 AM  Result Value Ref Range Status   Enterococcus faecalis NOT  DETECTED NOT DETECTED Final   Enterococcus Faecium NOT DETECTED NOT DETECTED Final   Listeria monocytogenes NOT DETECTED NOT DETECTED Final   Staphylococcus species NOT DETECTED NOT DETECTED Final   Staphylococcus aureus (BCID) NOT DETECTED NOT DETECTED Final   Staphylococcus epidermidis NOT DETECTED NOT DETECTED Final   Staphylococcus lugdunensis NOT DETECTED NOT DETECTED Final   Streptococcus species NOT DETECTED NOT DETECTED Final   Streptococcus agalactiae NOT DETECTED NOT DETECTED Final   Streptococcus pneumoniae NOT DETECTED NOT DETECTED Final   Streptococcus pyogenes NOT DETECTED NOT DETECTED Final   A.calcoaceticus-baumannii NOT DETECTED NOT DETECTED Final   Bacteroides fragilis NOT DETECTED NOT DETECTED Final   Enterobacterales NOT DETECTED NOT DETECTED Final   Enterobacter cloacae complex NOT DETECTED NOT DETECTED Final   Escherichia coli NOT DETECTED NOT DETECTED Final   Klebsiella aerogenes NOT DETECTED NOT DETECTED Final   Klebsiella oxytoca NOT DETECTED NOT DETECTED Final   Klebsiella pneumoniae NOT DETECTED NOT DETECTED Final    Proteus species NOT DETECTED NOT DETECTED Final   Salmonella species NOT DETECTED NOT DETECTED Final   Serratia marcescens NOT DETECTED NOT DETECTED Final   Haemophilus influenzae NOT DETECTED NOT DETECTED Final   Neisseria meningitidis NOT DETECTED NOT DETECTED Final   Pseudomonas aeruginosa NOT DETECTED NOT DETECTED Final   Stenotrophomonas maltophilia NOT DETECTED NOT DETECTED Final   Candida albicans NOT DETECTED NOT DETECTED Final   Candida auris NOT DETECTED NOT DETECTED Final   Candida glabrata NOT DETECTED NOT DETECTED Final   Candida krusei NOT DETECTED NOT DETECTED Final   Candida parapsilosis NOT DETECTED NOT DETECTED Final   Candida tropicalis NOT DETECTED NOT DETECTED Final   Cryptococcus neoformans/gattii NOT DETECTED NOT DETECTED Final    Comment: Performed at [facility], [address], [place], [place] [address]  Surgical pcr screen     Status: None   Collection Time: 11/21/23  6:27 AM   Specimen: Nasal Mucosa; Nasal Swab  Result Value Ref Range Status   MRSA, PCR NEGATIVE NEGATIVE Final   Staphylococcus aureus NEGATIVE NEGATIVE Final    Comment: (NOTE) The Xpert SA Assay (FDA approved for NASAL specimens in patients 12 years of age and older), is one component of a comprehensive surveillance program. It is not intended to diagnose infection nor to guide or monitor treatment. Performed at [facility], [address], [place], [place] [address]   Aerobic/Anaerobic Culture w Gram Stain (surgical/deep wound)     Status: None   Collection Time: 11/21/23 12:33 PM   Specimen: Path Tissue  Result Value Ref Range Status   Specimen Description TISSUE  Final   Special Requests left foot ulcer  Final   Gram Stain NO WBC SEEN RARE GRAM POSITIVE COCCI IN PAIRS   Final   Culture   Final    ABUNDANT STAPHYLOCOCCUS SIMULANS MODERATE DIPHTHEROIDS(CORYNEBACTERIUM SPECIES) Standardized susceptibility testing for this organism is not available. RARE PROTEUS  PENNERI NO ANAEROBES ISOLATED Performed at [facility], [address], [place], [place] [address]    Report Status 11/26/2023 FINAL  Final   Organism ID, Bacteria PROTEUS PENNERI  Final   Organism ID, Bacteria STAPHYLOCOCCUS SIMULANS  Final      Susceptibility   Proteus penneri - MIC*    AMPICILLIN >=32 RESISTANT Resistant     CEFEPIME <=0.12 SENSITIVE Sensitive     CEFTAZIDIME <=1 SENSITIVE Sensitive     CEFTRIAXONE <=0.25 SENSITIVE Sensitive     CIPROFLOXACIN <=0.25 SENSITIVE Sensitive  GENTAMICIN <=1 SENSITIVE Sensitive     IMIPENEM 4 SENSITIVE Sensitive     TRIMETH /SULFA  <=20 SENSITIVE Sensitive     AMPICILLIN/SULBACTAM 16 INTERMEDIATE Intermediate     PIP/TAZO <=4 SENSITIVE Sensitive ug/mL    * RARE PROTEUS PENNERI   Staphylococcus simulans - MIC*    CIPROFLOXACIN >=8 RESISTANT Resistant     ERYTHROMYCIN >=8 RESISTANT Resistant     GENTAMICIN <=0.5 SENSITIVE Sensitive     OXACILLIN >=4 RESISTANT Resistant     TETRACYCLINE <=1 SENSITIVE Sensitive     VANCOMYCIN  <=0.5 SENSITIVE Sensitive     TRIMETH /SULFA  <=10 SENSITIVE Sensitive     CLINDAMYCIN RESISTANT Resistant     RIFAMPIN <=0.5 SENSITIVE Sensitive     Inducible Clindamycin POSITIVE Resistant     * ABUNDANT STAPHYLOCOCCUS SIMULANS  Culture, blood (Routine X 2) w Reflex to ID Panel     Status: None   Collection Time: 11/23/23  4:39 PM   Specimen: BLOOD  Result Value Ref Range Status   Specimen Description BLOOD BLOOD LEFT ARM  Final   Special Requests   Final    AEROBIC BOTTLE ONLY Blood Culture results may not be optimal due to an inadequate volume of blood received in culture bottles   Culture   Final    NO GROWTH 5 DAYS Performed at [facility], [address], [place], [place] [address]    Report Status 11/28/2023 FINAL  Final  Culture, blood (Routine X 2) w Reflex to ID Panel     Status: None   Collection Time: 11/23/23  4:40 PM   Specimen: BLOOD  Result Value Ref Range Status   Specimen  Description BLOOD BLOOD LEFT ARM  Final   Special Requests   Final    AEROBIC BOTTLE ONLY Blood Culture results may not be optimal due to an inadequate volume of blood received in culture bottles   Culture   Final    NO GROWTH 5 DAYS Performed at [facility], [address] [place], [place] [address]    Report Status 11/28/2023 FINAL  Final  Organism ID, Bacteria     Status: None   Collection Time: 11/25/23 11:42 AM  Result Value Ref Range Status   Organism ID, Bacteria NOT PERFORMED  Corrected    Comment: (NOTE) Test not performed Performed At: [facility] [address] [place], [place] [id number] Pearlean Botts MD QM:[id number] CORRECTED ON 04/13 AT 2841: PREVIOUSLY REPORTED AS CANPRO    Source BLOOD  Final    Comment: Performed at [facility], [address], [place], [place] [address]  Bacterial organism reflex     Status: None   Collection Time: 11/25/23 11:42 AM  Result Value Ref Range Status   Bacterial result 1 Comment  Final    Comment: (NOTE) Specimen has been received and testing has been initiated. Performed At: [facility] [address] [place], [place] [id number] Pearlean Botts MD YQ:[id number]   Susceptibility, Aer + Anaerob     Status: Abnormal   Collection Time: 11/25/23 11:42 AM  Result Value Ref Range Status   Suscept, Aer + Anaerob Final report (A)  Corrected    Comment: (NOTE) Performed At: [facility] [address] [place], [place] [id number] Pearlean Botts MD RJ:[id number] CORRECTED ON 04/14 AT 1235: PREVIOUSLY REPORTED AS Preliminary report    Source BLOOD  Final    Comment: Performed at [facility], [address], [place], [place] [address]  Susceptibility Result  Status: Abnormal   Collection Time: 11/25/23 11:42 AM  Result Value Ref Range Status   Suscept Result 1 Comment (A)  Final    Comment: (NOTE) Sphingomonas paucimobilis Identification performed by account, not confirmed by  this laboratory. Testing performed by broth microdilution.    Antimicrobial Suscept Comment  Corrected    Comment: (NOTE)      ** S = Susceptible; I = Intermediate; R = Resistant **                   P = Positive; N = Negative            MICS are expressed in micrograms per mL   Antibiotic                 RSLT#1    RSLT#2    RSLT#3    RSLT#4 Amikacin                       S Aztreonam                      S Cefepime                       S Cefotaxime                     S Ceftazidime                    S Ceftriaxone                    S Ciprofloxacin                  S Gentamicin                     S Imipenem                       S Levofloxacin                   S Meropenem                      S Minocycline                    S Piperacillin /Tazobactam        S Tobramycin                      S Trimethoprim /Sulfa              S Performed At: [facility] [address] [place], [place] [id number] Pearlean Botts MD BM:[id number]     Labs: CBC: Recent Labs  Lab 12/13/23 0135 12/13/23 0143 12/15/23 0530 12/15/23 1758 12/16/23 0443 12/16/23 1734 12/17/23 1055  WBC 10.2   < > 8.2 5.9 5.7 5.2 6.8  NEUTROABS 8.1*  --   --   --   --   --   --   HGB 15.1   < > 8.8* 8.3* 8.4* 8.9* 9.0*  HCT 45.2   < > 25.9* 24.4* 24.3* 26.9* 27.0*  MCV 85.8   < > 86.3 86.2 85.3 86.8 85.4  PLT 224   < > 144* 131* 129* PLATELET CLUMPS NOTED ON SMEAR, COUNT APPEARS DECREASED 156   < > = values in this interval not displayed.   Basic Metabolic  Panel: Recent Labs  Lab 12/13/23 0135 12/13/23 0143 12/14/23 0924 12/15/23 0530 12/16/23 0443 12/17/23 1055  NA 137 137 137 138 137 138  K 4.8 4.7 4.4 4.4 4.1 4.2  CL 100 104 108 107 106 107  CO2 19*  --  21* 23 24 25   GLUCOSE 163* 159* 155* 100* 107* 170*  BUN 38* 43* 51* 46* 32* 24*  CREATININE 2.38* 2.50* 1.97* 1.55* 1.47* 1.36*  CALCIUM  9.6  --  8.5* 8.8* 8.4* 9.0  PHOS  --   --   --  2.2* 2.6 2.3*   Liver Function  Tests: Recent Labs  Lab 12/13/23 0135 12/15/23 0530 12/16/23 0443 12/17/23 1055  AST 31  --   --   --   ALT 32  --   --   --   ALKPHOS 42  --   --   --   BILITOT 0.9  --   --   --   PROT 7.8  --   --   --   ALBUMIN 4.3 3.6 3.2* 3.5   CBG: Recent Labs  Lab 12/16/23 2108 12/16/23 2336 12/17/23 0507 12/17/23 0820 12/17/23 1158  GLUCAP 168* 219* 135* 186* 198*    Discharge time spent: greater than 30 minutes.  Signed: Bertram Brocks, MD [facility] 12/17/2023

## 2023-12-17 NOTE — Progress Notes (Signed)
 [facility]                                                                              [name], is a 68 y.o. male, DOB - 07-Jun-1956, ZOX:[id number] Admit date - 12/13/2023    Outpatient Primary MD for the patient is System, Provider Not In  LOS - 4  days  Chief Complaint  Patient presents with   Emesis   Abdominal Pain       Brief summary   Patient is a 68 year old male with poorly controlled IDDM type II, peripheral neuropathy, HLD, PAD presented with nausea vomiting and abdominal pain. Patient was recently hospitalized 4/1-4/11 after presenting with infected diabetic foot ulcer concerning for osteomyelitis.  Vascular surgery was consulted and underwent angioplasty of the left posterior tibial artery on 4/3.  Underwent I&D and bone biopsy with podiatry on 4/4.Interim operative cultures grew Staphylococcus simulans, moderate Diphtheroids, rare Proteus species.  ID was consulted and recommended patient be on Zyvox  600 mg twice daily and Bactrim  twice daily for total 3 weeks, EOT 4/25. Patient reported vomiting for the last 2 to 3 days with coffee-ground emesis, nausea, indigestion with abdominal pain in the lower part of the stomach.  Denied any hematochezia or melena. No NSAID use. NAD, creatinine 2.38, lactic acid 2.2.  Hemoglobin stable initially in ED 15.0  Assessment & Plan    Principal Problem:   Hematemesis, acute upper GI bleed, acute blood loss anemia - Hemoglobin 15.1 on arrival however likely hemoconcentrated due to severe dehydration, nausea and vomiting.  Hemoglobin was 12.7 on 11/28/2023 -CT ab showed mild lower esophageal wall thickening consistent with esophagitis - Patient was placed on IV PPI and sucralfate - Aspirin , Plavix  were held and GI consulted. - Underwent EGD 4/27 which showed LA grade D reflux esophagitis with bleeding, hemostatic spray applied, clotted blood in the gastric fundus, normal duodenum.  Received 1 unit packed RBCs in  endo. - No further bleeding, H&H stable, hemoglobin 8.4  - Repeat EGD on 4/29 showed LA grade D ulcerative esophagitis with no active bleeding, mild scattered nonbleeding gastritis, biopsied.  Normal duodenum.  Recommended twice daily PPI for 8 weeks then once daily indefinitely.  Okay for her aspirin  however would hold Plavix  for at least another 7 days to allow more time for esophagitis to hold. - Outpatient follow-up with GI. - Aspirin  started, H&H stable  Active Problems:   Lactic acidosis -Likely secondary to dehydration, nausea and vomiting -Resolved  Acute kidney injury superimposed on chronic kidney disease stage IIIb -Creatinine 2.38 on admission worsened to 2.5, baseline 1.3-1.6  - Back at baseline 1.3  Uncontrolled diabetes mellitus type 2 with peripheral neuropathy, IDDM -Hold Jardiance,  - Hemoglobin A1c 8.9 on 11/19/2023 - Started back on carb modified diet - Continue sliding scale insulin , sensitive, increase Semglee  to 10 units twice daily (outpatient on 12 units twice daily)  History of osteomyelitis of the left foot - recently hospitalized 4/1 - 4/11 for osteomyelitis of the left foot status post I&D with bone biopsy).  Cultures grew out multiple species for which patient was placed on Zyvox  and Bactrim  completed 3-week course,  EOT 12/12/2023 - Continue wound care - Continue outpatient follow-up with podiatry   PAD  Hyperlipidemia - s/p angioplasty of the left posterior tibial artery on 4/3. - Continue atorvastatin  - Resumed aspirin , continue to hold Plavix  for 1 week   Essential hypertension - BP improving   Anxiety and depression - Continue Seroquel    Estimated body mass index is 25.1 kg/m as calculated from the following:   Height as of this encounter: 5\' 11"  (1.803 m).   Weight as of this encounter: 81.6 kg.  Code Status: Full code DVT Prophylaxis:  SCDs Start: 12/13/23 0839   Level of Care: Level of care: Telemetry Medical Family Communication:  Updated patient Disposition Plan:      Remains inpatient appropriate:   Hopefully DC back to SNF today or tomorrow pending SNF authorization   Procedures:  EGD  4/27  Consultants:   GI  Antimicrobials:   Anti-infectives (From admission, onward)    None          Medications  sodium chloride    Intravenous Once   aspirin  EC  81 mg Oral Daily   atorvastatin   40 mg Oral Daily   insulin  aspart  0-5 Units Subcutaneous QHS   insulin  aspart  0-9 Units Subcutaneous TID WC   insulin  glargine-yfgn  10 Units Subcutaneous QHS   pantoprazole   40 mg Oral BID   polymixin-bacitracin   Topical QODAY   QUEtiapine   50 mg Oral q AM   sucralfate  1 g Oral TID WC & HS      Subjective:   Jeremiah Burns was seen and examined today.  Doing well, no acute complaints.  Started back on aspirin .  No nausea vomit abdominal pain or any hematemesis.  Objective:   Vitals:   12/16/23 1611 12/16/23 2109 12/17/23 0504 12/17/23 0817  BP: 109/61 124/72 110/60 (!) 153/84  Pulse: 98 75 81 99  Resp: 17 18 18 16   Temp: (!) 97.5 F (36.4 C) 98 F (36.7 C) 98 F (36.7 C) 98.6 F (37 C)  TempSrc: Oral Axillary Axillary   SpO2: 98% 98% 98% 94%  Weight:      Height:        Intake/Output Summary (Last 24 hours) at 12/17/2023 1330 Last data filed at 12/17/2023 1042 Gross per 24 hour  Intake 590 ml  Output 1250 ml  Net -660 ml      Wt Readings from Last 3 Encounters:  12/16/23 81.6 kg  11/21/23 94.4 kg  05/25/15 86.2 kg   Physical Exam General: Alert and oriented x 3, NAD Cardiovascular: S1 S2 clear, RRR.  Respiratory: CTAB, no wheezing Gastrointestinal: Soft, nontender, nondistended, NBS Ext: no pedal edema bilaterally Neuro: no new deficits Psych: Normal affect       Data Reviewed:  I have personally reviewed following labs    CBC Lab Results  Component Value Date   WBC 6.8 12/17/2023   RBC 3.16 (L) 12/17/2023   HGB 9.0 (L) 12/17/2023   HCT 27.0 (L) 12/17/2023   MCV  85.4 12/17/2023   MCH 28.5 12/17/2023   PLT 156 12/17/2023   MCHC 33.3 12/17/2023   RDW 13.2 12/17/2023   LYMPHSABS 1.5 12/13/2023   MONOABS 0.5 12/13/2023   EOSABS 0.0 12/13/2023   BASOSABS 0.1 12/13/2023     Last metabolic panel Lab Results  Component Value Date   NA 138 12/17/2023   K 4.2 12/17/2023   CL 107 12/17/2023   CO2 25 12/17/2023   BUN 24 (H) 12/17/2023  CREATININE 1.36 (H) 12/17/2023   GLUCOSE 170 (H) 12/17/2023   GFRNONAA 57 (L) 12/17/2023   CALCIUM  9.0 12/17/2023   PHOS 2.3 (L) 12/17/2023   PROT 7.8 12/13/2023   ALBUMIN 3.5 12/17/2023   BILITOT 0.9 12/13/2023   ALKPHOS 42 12/13/2023   AST 31 12/13/2023   ALT 32 12/13/2023   ANIONGAP 6 12/17/2023    CBG (last 3)  Recent Labs    12/17/23 0507 12/17/23 0820 12/17/23 1158  GLUCAP 135* 186* 198*      Coagulation Profile: No results for input(s): "INR", "PROTIME" in the last 168 hours.   Radiology Studies: I have personally reviewed the imaging studies  No results found.      Bertram Brocks M.D. [facility] 12/17/2023, 1:30 PM  Available via Epic secure chat 7am-7pm After 7 pm, please refer to night coverage provider listed on amion.

## 2023-12-17 NOTE — TOC Progression Note (Signed)
 Transition of Care [facility]) - Progression Note    Patient Details  Name: Jeremiah Burns MRN: [id number] Date of Birth: 01/13/1956  [facility]) CM/SW Contact  Araly Kaas A [place], LCSW Phone Number: 12/17/2023, 12:25 PM  Clinical Narrative:     CSW started insurance authorization for pt. Status pending.   Auth ID: [id number]  Can return to [facility] for rehab today if authorization is approved.    TOC will continue to follow.  Expected Discharge Plan: Skilled Nursing Facility Barriers to Discharge: Continued Medical Work up  Expected Discharge Plan and Services       Living arrangements for the past 2 months: Single Family Home, Skilled Nursing Facility                                       Social Determinants of Health (SDOH) Interventions SDOH Screenings   Food Insecurity: No Food Insecurity (12/13/2023)  Housing: Low Risk  (12/13/2023)  Transportation Needs: No Transportation Needs (12/13/2023)  Utilities: Not At Risk (12/13/2023)  Recent Concern: Utilities - At Risk (11/19/2023)  Financial Resource Strain: Not at Risk (08/07/2023)   Received from [facility]  Physical Activity: At Risk (08/07/2023)   Received from [facility]  Social Connections: Moderately Isolated (12/13/2023)  Stress: Not at Risk (08/07/2023)   Received from [facility]  Tobacco Use: Low Risk  (12/16/2023)    Readmission Risk Interventions     No data to display

## 2023-12-17 NOTE — Progress Notes (Signed)
 Report called to [facility] spoke to [facility] LPN , pt  was a previous pt there. Waiting for [facility] ambulance to pick him up.

## 2023-12-18 ENCOUNTER — Encounter: Payer: Self-pay | Admitting: Internal Medicine

## 2023-12-18 LAB — TYPE AND SCREEN
ABO/RH(D): A NEG
Antibody Screen: NEGATIVE
Unit division: 0
Unit division: 0

## 2023-12-18 LAB — BPAM RBC
Blood Product Expiration Date: 202505052359
Blood Product Expiration Date: 202505052359
Unit Type and Rh: 600
Unit Type and Rh: 600

## 2023-12-23 ENCOUNTER — Telehealth: Payer: Self-pay

## 2023-12-23 ENCOUNTER — Encounter: Admitting: Podiatry

## 2023-12-23 ENCOUNTER — Ambulatory Visit (INDEPENDENT_AMBULATORY_CARE_PROVIDER_SITE_OTHER): Admitting: Podiatry

## 2023-12-23 DIAGNOSIS — L97424 Non-pressure chronic ulcer of left heel and midfoot with necrosis of bone: Secondary | ICD-10-CM

## 2023-12-23 DIAGNOSIS — M86172 Other acute osteomyelitis, left ankle and foot: Secondary | ICD-10-CM

## 2023-12-23 DIAGNOSIS — L97522 Non-pressure chronic ulcer of other part of left foot with fat layer exposed: Secondary | ICD-10-CM

## 2023-12-23 DIAGNOSIS — Z9889 Other specified postprocedural states: Secondary | ICD-10-CM

## 2023-12-23 DIAGNOSIS — E11621 Type 2 diabetes mellitus with foot ulcer: Secondary | ICD-10-CM

## 2023-12-23 MED ORDER — OXYCODONE-ACETAMINOPHEN 5-325 MG PO TABS: 1.0000 | ORAL_TABLET | ORAL | 0 refills | Status: AC | PRN

## 2023-12-23 MED ORDER — SULFAMETHOXAZOLE-TRIMETHOPRIM 800-160 MG PO TABS: 1.0000 | ORAL_TABLET | Freq: Two times a day (BID) | ORAL | 0 refills | Status: AC

## 2023-12-23 NOTE — Progress Notes (Signed)
 Subjective:  Patient ID: Jeremiah Burns, male    DOB: 31-Mar-1956,  MRN: [id number]   DOS: 11/21/23 Procedure:  1.  Irrigation and debridement of ulceration to bone level with prep for graft, left foot 2.  Tibial sesamoidectomy, left foot 3.  Bone biopsy via trocar open superficial proximal phalanx and first metatarsal head, left foot 4.  Application of dissolvable antibiotic beads, left foot 5.  Application Integra bilayer dermal allograft, 3 x 2.5 cm, left foot    68 y.o. male seen for post op check.   He is now approximately 1 mo post op.  He reports he has not had any change in dressing since his last appointment as he is not able to change the dressing himself.  He is doing a lot of walking in the postop shoe.  No longer on antibiotics status post a course of Bactrim  and Zyvox  for 3 weeks  Review of Systems: Negative except as noted in the HPI. Denies N/V/F/Ch.   Objective:   There were no vitals filed for this visit.  There is no height or weight on file to calculate BMI. Constitutional Well developed. Well nourished.  Vascular Foot warm and well perfused. Capillary refill normal to all digits.   No calf pain with palpation  Neurologic Normal speech. Oriented to person, place, and time. Epicritic sensation diminished  Dermatologic Ulceration with exposed fat tissue present.  There is some necrotic and fibrotic tissues around the wound though the wound bed appears to be mostly healthy granular tissue.  Some sanguinous drainage noted.  Wound does appear to track deeper towards the first metatarsal head.  Mild erythema and edema of the area.  Wound measures approximately 3 x 2 cm postdebridement    Orthopedic: S/p I&D left plantar 1st MPJ   Radiographs: Deferred new XR today  Pathology:  A. BONE, LEFT FOOT TIBIAL, RESECTION:  Benign bone with attached connective tissue.  Negative for inflammation and malignancy.   B. BONE, PROXIMAL PHALANX, BIOPSY:  Benign bone with  focal fibrosis and reactive bone suggestive of healed osteomyelitis.  Negative for inflammation and malignancy.   C. BONE, FIRST METATARSAL, BIOPSY:  Benign bone and cartilage.  Negative for inflammation and malignancy.   Micro: S. 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  1 mo s/p above procedures, healing as expected post op.  -Overall improving with graft and wound care -Progressing alright at this time concerned that patient has not been having dressing changes done outpatient due to him not being able to do the dressing he states. -Recommend ongoing wound care with antibiotic ointment applied to the ulceration and then covered with Xeroform 4 x 4 gauze dressing -XR: Deferred today continue to monitor if there is slow healing recommend additional x-ray -Patient is at high risk for needing partial first ray amputation of the left foot given open wound and overall noncompliance with prior treatment recommendations - ABX per ID, s/p course of abx, will send additional antibiotic therapy -WB Status: NWB in post op shoe to LLE -patient walking in the postop shoe at this time said it is okay to do so but the more he walks through the sole of the wound with heel will be failed to heal -Medications/ABX: per ID -Referral to [facility] wound care center placed.  Heather notified - Continue wound care follow-up in  2 weeks with Dr. Marvis Sluder in [place].         Jeremiah Burns, DPM [facility]

## 2023-12-23 NOTE — Telephone Encounter (Signed)
 Referral, office note and demographics faxed to [facility]  Phone [phone], fax [phone]

## 2023-12-24 ENCOUNTER — Other Ambulatory Visit: Payer: Self-pay

## 2023-12-24 DIAGNOSIS — I739 Peripheral vascular disease, unspecified: Secondary | ICD-10-CM

## 2024-01-02 ENCOUNTER — Ambulatory Visit (HOSPITAL_COMMUNITY)

## 2024-01-02 ENCOUNTER — Ambulatory Visit (HOSPITAL_COMMUNITY): Attending: Vascular Surgery

## 2024-01-06 ENCOUNTER — Ambulatory Visit: Attending: Vascular Surgery

## 2024-01-19 ENCOUNTER — Ambulatory Visit: Admitting: Podiatry

## 2024-02-02 ENCOUNTER — Other Ambulatory Visit: Payer: Self-pay

## 2024-02-02 ENCOUNTER — Encounter: Payer: Self-pay | Admitting: Podiatry

## 2024-02-02 ENCOUNTER — Ambulatory Visit (INDEPENDENT_AMBULATORY_CARE_PROVIDER_SITE_OTHER)

## 2024-02-02 ENCOUNTER — Encounter (HOSPITAL_COMMUNITY): Payer: Self-pay

## 2024-02-02 ENCOUNTER — Emergency Department (HOSPITAL_COMMUNITY)

## 2024-02-02 ENCOUNTER — Ambulatory Visit (INDEPENDENT_AMBULATORY_CARE_PROVIDER_SITE_OTHER): Admitting: Podiatry

## 2024-02-02 ENCOUNTER — Inpatient Hospital Stay (HOSPITAL_COMMUNITY)
Admission: EM | Admit: 2024-02-02 | Discharge: 2024-02-09 | DRG: 464 | Disposition: A | Discharge status: Home Health | Attending: Internal Medicine | Admitting: Internal Medicine

## 2024-02-02 DIAGNOSIS — Z9862 Peripheral vascular angioplasty status: Secondary | ICD-10-CM | POA: Diagnosis not present

## 2024-02-02 DIAGNOSIS — M868X7 Other osteomyelitis, ankle and foot: Secondary | ICD-10-CM | POA: Diagnosis present

## 2024-02-02 DIAGNOSIS — L97524 Non-pressure chronic ulcer of other part of left foot with necrosis of bone: Secondary | ICD-10-CM

## 2024-02-02 DIAGNOSIS — L89890 Pressure ulcer of other site, unstageable: Secondary | ICD-10-CM

## 2024-02-02 DIAGNOSIS — Z885 Allergy status to narcotic agent status: Secondary | ICD-10-CM | POA: Diagnosis not present

## 2024-02-02 DIAGNOSIS — N1832 Chronic kidney disease, stage 3b: Secondary | ICD-10-CM | POA: Diagnosis present

## 2024-02-02 DIAGNOSIS — L089 Local infection of the skin and subcutaneous tissue, unspecified: Secondary | ICD-10-CM | POA: Diagnosis not present

## 2024-02-02 DIAGNOSIS — Z79899 Other long term (current) drug therapy: Secondary | ICD-10-CM

## 2024-02-02 DIAGNOSIS — D509 Iron deficiency anemia, unspecified: Secondary | ICD-10-CM | POA: Diagnosis present

## 2024-02-02 DIAGNOSIS — M869 Osteomyelitis, unspecified: Secondary | ICD-10-CM

## 2024-02-02 DIAGNOSIS — F419 Anxiety disorder, unspecified: Secondary | ICD-10-CM | POA: Diagnosis present

## 2024-02-02 DIAGNOSIS — E1165 Type 2 diabetes mellitus with hyperglycemia: Secondary | ICD-10-CM | POA: Diagnosis present

## 2024-02-02 DIAGNOSIS — E1169 Type 2 diabetes mellitus with other specified complication: Secondary | ICD-10-CM | POA: Diagnosis present

## 2024-02-02 DIAGNOSIS — E1122 Type 2 diabetes mellitus with diabetic chronic kidney disease: Secondary | ICD-10-CM | POA: Diagnosis present

## 2024-02-02 DIAGNOSIS — N1831 Chronic kidney disease, stage 3a: Secondary | ICD-10-CM | POA: Diagnosis present

## 2024-02-02 DIAGNOSIS — Z794 Long term (current) use of insulin: Secondary | ICD-10-CM | POA: Diagnosis not present

## 2024-02-02 DIAGNOSIS — Z886 Allergy status to analgesic agent status: Secondary | ICD-10-CM | POA: Diagnosis not present

## 2024-02-02 DIAGNOSIS — L97522 Non-pressure chronic ulcer of other part of left foot with fat layer exposed: Secondary | ICD-10-CM | POA: Diagnosis present

## 2024-02-02 DIAGNOSIS — L03116 Cellulitis of left lower limb: Secondary | ICD-10-CM | POA: Diagnosis present

## 2024-02-02 DIAGNOSIS — Z8719 Personal history of other diseases of the digestive system: Secondary | ICD-10-CM

## 2024-02-02 DIAGNOSIS — F32A Depression, unspecified: Secondary | ICD-10-CM | POA: Diagnosis present

## 2024-02-02 DIAGNOSIS — E11628 Type 2 diabetes mellitus with other skin complications: Secondary | ICD-10-CM | POA: Diagnosis present

## 2024-02-02 DIAGNOSIS — K219 Gastro-esophageal reflux disease without esophagitis: Secondary | ICD-10-CM | POA: Diagnosis present

## 2024-02-02 DIAGNOSIS — E11621 Type 2 diabetes mellitus with foot ulcer: Secondary | ICD-10-CM | POA: Diagnosis present

## 2024-02-02 DIAGNOSIS — E119 Type 2 diabetes mellitus without complications: Secondary | ICD-10-CM

## 2024-02-02 DIAGNOSIS — E1151 Type 2 diabetes mellitus with diabetic peripheral angiopathy without gangrene: Secondary | ICD-10-CM | POA: Diagnosis present

## 2024-02-02 DIAGNOSIS — E8721 Acute metabolic acidosis: Secondary | ICD-10-CM | POA: Diagnosis present

## 2024-02-02 DIAGNOSIS — M009 Pyogenic arthritis, unspecified: Principal | ICD-10-CM | POA: Diagnosis present

## 2024-02-02 DIAGNOSIS — I129 Hypertensive chronic kidney disease with stage 1 through stage 4 chronic kidney disease, or unspecified chronic kidney disease: Secondary | ICD-10-CM | POA: Diagnosis present

## 2024-02-02 DIAGNOSIS — M60074 Infective myositis, left foot: Secondary | ICD-10-CM | POA: Diagnosis present

## 2024-02-02 DIAGNOSIS — I739 Peripheral vascular disease, unspecified: Secondary | ICD-10-CM | POA: Diagnosis present

## 2024-02-02 DIAGNOSIS — M86172 Other acute osteomyelitis, left ankle and foot: Secondary | ICD-10-CM | POA: Diagnosis not present

## 2024-02-02 DIAGNOSIS — Z7984 Long term (current) use of oral hypoglycemic drugs: Secondary | ICD-10-CM | POA: Diagnosis not present

## 2024-02-02 LAB — CBC WITH DIFFERENTIAL/PLATELET
Abs Immature Granulocytes: 0.03 10*3/uL (ref 0.00–0.07)
Basophils Absolute: 0.1 10*3/uL (ref 0.0–0.1)
Basophils Relative: 1 %
Eosinophils Absolute: 0.1 10*3/uL (ref 0.0–0.5)
Eosinophils Relative: 1 %
HCT: 33 % — ABNORMAL LOW (ref 39.0–52.0)
Hemoglobin: 10.4 g/dL — ABNORMAL LOW (ref 13.0–17.0)
Immature Granulocytes: 0 %
Lymphocytes Relative: 21 %
Lymphs Abs: 2 10*3/uL (ref 0.7–4.0)
MCH: 24.8 pg — ABNORMAL LOW (ref 26.0–34.0)
MCHC: 31.5 g/dL (ref 30.0–36.0)
MCV: 78.8 fL — ABNORMAL LOW (ref 80.0–100.0)
Monocytes Absolute: 0.9 10*3/uL (ref 0.1–1.0)
Monocytes Relative: 9 %
Neutro Abs: 6.5 10*3/uL (ref 1.7–7.7)
Neutrophils Relative %: 68 %
Platelets: 419 10*3/uL — ABNORMAL HIGH (ref 150–400)
RBC: 4.19 MIL/uL — ABNORMAL LOW (ref 4.22–5.81)
RDW: 14.3 % (ref 11.5–15.5)
WBC: 9.5 10*3/uL (ref 4.0–10.5)
nRBC: 0 % (ref 0.0–0.2)

## 2024-02-02 LAB — COMPREHENSIVE METABOLIC PANEL WITH GFR
ALT: 19 U/L (ref 0–44)
AST: 18 U/L (ref 15–41)
Albumin: 3.8 g/dL (ref 3.5–5.0)
Alkaline Phosphatase: 58 U/L (ref 38–126)
Anion gap: 11 (ref 5–15)
BUN: 34 mg/dL — ABNORMAL HIGH (ref 8–23)
CO2: 20 mmol/L — ABNORMAL LOW (ref 22–32)
Calcium: 9.8 mg/dL (ref 8.9–10.3)
Chloride: 101 mmol/L (ref 98–111)
Creatinine, Ser: 1.69 mg/dL — ABNORMAL HIGH (ref 0.61–1.24)
GFR, Estimated: 44 mL/min — ABNORMAL LOW (ref >60)
Glucose, Bld: 294 mg/dL — ABNORMAL HIGH (ref 70–99)
Potassium: 5.1 mmol/L (ref 3.5–5.1)
Sodium: 132 mmol/L — ABNORMAL LOW (ref 135–145)
Total Bilirubin: 0.4 mg/dL (ref 0.0–1.2)
Total Protein: 7.7 g/dL (ref 6.5–8.1)

## 2024-02-02 LAB — HEMOGLOBIN A1C
Hgb A1c MFr Bld: 9.2 % — ABNORMAL HIGH (ref 4.8–5.6)
Mean Plasma Glucose: 217.34 mg/dL

## 2024-02-02 LAB — I-STAT CG4 LACTIC ACID, ED: Lactic Acid, Venous: 2.2 mmol/L (ref 0.5–1.9)

## 2024-02-02 MED ORDER — QUETIAPINE FUMARATE 50 MG PO TABS
50.0000 mg | ORAL_TABLET | Freq: Every day | ORAL | Status: DC
  Administered 2024-02-03 – 2024-02-08 (×6): 50 mg via ORAL
  Filled 2024-02-02 (×7): qty 1

## 2024-02-02 MED ORDER — ATORVASTATIN CALCIUM 40 MG PO TABS
40.0000 mg | ORAL_TABLET | Freq: Every day | ORAL | Status: DC
  Administered 2024-02-03 – 2024-02-09 (×6): 40 mg via ORAL
  Filled 2024-02-02 (×6): qty 1

## 2024-02-02 MED ORDER — LINEZOLID 600 MG/300ML IV SOLN
600.0000 mg | Freq: Once | INTRAVENOUS | Status: AC
  Administered 2024-02-02: 600 mg via INTRAVENOUS
  Filled 2024-02-02: qty 300

## 2024-02-02 MED ORDER — OXYCODONE HCL 5 MG PO TABS
5.0000 mg | ORAL_TABLET | ORAL | Status: DC | PRN
  Administered 2024-02-03 – 2024-02-08 (×11): 5 mg via ORAL
  Filled 2024-02-02 (×12): qty 1

## 2024-02-02 MED ORDER — LACTATED RINGERS IV BOLUS
1000.0000 mL | Freq: Once | INTRAVENOUS | Status: AC
  Administered 2024-02-02: 1000 mL via INTRAVENOUS

## 2024-02-02 MED ORDER — ACETAMINOPHEN 325 MG PO TABS: 650.0000 mg | ORAL_TABLET | Freq: Four times a day (QID) | ORAL | Status: DC | PRN

## 2024-02-02 MED ORDER — SODIUM CHLORIDE 0.9 % IV SOLN
3.0000 g | Freq: Once | INTRAVENOUS | Status: AC
  Administered 2024-02-02: 3 g via INTRAVENOUS
  Filled 2024-02-02: qty 8

## 2024-02-02 MED ORDER — LINEZOLID 600 MG/300ML IV SOLN
600.0000 mg | Freq: Two times a day (BID) | INTRAVENOUS | Status: DC
  Administered 2024-02-03 – 2024-02-05 (×4): 600 mg via INTRAVENOUS
  Filled 2024-02-02 (×5): qty 300

## 2024-02-02 MED ORDER — INSULIN GLARGINE-YFGN 100 UNIT/ML ~~LOC~~ SOLN
12.0000 [IU] | Freq: Every day | SUBCUTANEOUS | Status: DC
  Administered 2024-02-03 – 2024-02-04 (×3): 12 [IU] via SUBCUTANEOUS
  Filled 2024-02-02 (×4): qty 0.12

## 2024-02-02 MED ORDER — PROCHLORPERAZINE EDISYLATE 10 MG/2ML IJ SOLN
5.0000 mg | Freq: Four times a day (QID) | INTRAMUSCULAR | Status: DC | PRN
  Administered 2024-02-05 – 2024-02-06 (×2): 5 mg via INTRAVENOUS
  Filled 2024-02-02 (×2): qty 2

## 2024-02-02 MED ORDER — SODIUM CHLORIDE 0.9 % IV SOLN
INTRAVENOUS | Status: AC
Start: 2024-02-03 — End: 2024-02-03

## 2024-02-02 MED ORDER — PANTOPRAZOLE SODIUM 40 MG PO TBEC
40.0000 mg | DELAYED_RELEASE_TABLET | Freq: Two times a day (BID) | ORAL | Status: DC
  Administered 2024-02-03 – 2024-02-09 (×12): 40 mg via ORAL
  Filled 2024-02-02 (×12): qty 1

## 2024-02-02 MED ORDER — HYDROMORPHONE HCL 1 MG/ML IJ SOLN
0.5000 mg | INTRAMUSCULAR | Status: DC | PRN
  Administered 2024-02-03 – 2024-02-05 (×4): 0.5 mg via INTRAVENOUS
  Filled 2024-02-02 (×4): qty 0.5

## 2024-02-02 MED ORDER — SENNOSIDES-DOCUSATE SODIUM 8.6-50 MG PO TABS: 1.0000 | ORAL_TABLET | Freq: Every evening | ORAL | Status: DC | PRN

## 2024-02-02 MED ORDER — ACETAMINOPHEN 650 MG RE SUPP: 650.0000 mg | Freq: Four times a day (QID) | RECTAL | Status: DC | PRN

## 2024-02-02 MED ORDER — INSULIN ASPART 100 UNIT/ML IJ SOLN
0.0000 [IU] | INTRAMUSCULAR | Status: DC
  Administered 2024-02-03: 2 [IU] via SUBCUTANEOUS
  Administered 2024-02-03: 1 [IU] via SUBCUTANEOUS
  Administered 2024-02-03: 2 [IU] via SUBCUTANEOUS
  Administered 2024-02-03: 3 [IU] via SUBCUTANEOUS
  Administered 2024-02-03: 5 [IU] via SUBCUTANEOUS
  Administered 2024-02-03 – 2024-02-04 (×2): 3 [IU] via SUBCUTANEOUS
  Administered 2024-02-04: 1 [IU] via SUBCUTANEOUS
  Administered 2024-02-04 (×2): 2 [IU] via SUBCUTANEOUS
  Administered 2024-02-04: 1 [IU] via SUBCUTANEOUS
  Administered 2024-02-05 (×4): 2 [IU] via SUBCUTANEOUS
  Administered 2024-02-05 (×2): 3 [IU] via SUBCUTANEOUS
  Administered 2024-02-06 (×2): 2 [IU] via SUBCUTANEOUS
  Administered 2024-02-06: 1 [IU] via SUBCUTANEOUS
  Administered 2024-02-06 – 2024-02-07 (×3): 2 [IU] via SUBCUTANEOUS
  Administered 2024-02-07: 3 [IU] via SUBCUTANEOUS
  Administered 2024-02-07 (×2): 2 [IU] via SUBCUTANEOUS
  Administered 2024-02-07: 1 [IU] via SUBCUTANEOUS
  Administered 2024-02-08: 7 [IU] via SUBCUTANEOUS
  Administered 2024-02-08: 3 [IU] via SUBCUTANEOUS
  Administered 2024-02-08 (×2): 1 [IU] via SUBCUTANEOUS
  Administered 2024-02-08: 2 [IU] via SUBCUTANEOUS
  Administered 2024-02-09: 1 [IU] via SUBCUTANEOUS
  Administered 2024-02-09: 2 [IU] via SUBCUTANEOUS
  Administered 2024-02-09: 1 [IU] via SUBCUTANEOUS

## 2024-02-02 NOTE — ED Provider Notes (Signed)
 [facility] Provider Note   CSN: [id number] Arrival date & time: 02/02/24  1734     History Chief Complaint  Patient presents with   Foot Problem    Jeremiah Burns is a 68 y.o. male w/ PMHx type 2 diabetes, diabetic foot wound, PAD, hypertension, who presents to the ED from podiatry office for worsening diabetic foot wound.  Patient status post surgical intervention for diabetic foot wound 4/4 with podiatry.  Patient was seen by podiatry in clinic 5/6 and not again until today.  Patient had not applied dressing since 5/6.  He reports worsening pain to the area.  He denies any fevers.  He states he has not been taking any antibiotics or other pain medication.  Wound was cleaned at podiatry office and subsequently patient sent to ED for infectious workup including MRI.  Podiatry requesting admission for IV antibiotics for likely osteomyelitis       Physical Exam Updated Vital Signs BP (!) 141/95   Pulse 87   Temp 99.4 F (37.4 C) (Oral)   Resp (!) 21   Ht 5' 11 (1.803 m)   Wt 81.6 kg   SpO2 100%   BMI 25.09 kg/m  Physical Exam Vitals and nursing note reviewed.  Constitutional:      General: He is not in acute distress.    Appearance: He is well-developed.     Comments: unkept   Cardiovascular:     Rate and Rhythm: Normal rate.  Pulmonary:     Effort: Pulmonary effort is normal.   Musculoskeletal:        General: No swelling.     Comments: Normal range of motion of bilateral lower extremities.  No obvious deformities.  Both feet neurovascular intact   Skin:    General: Skin is warm and dry.     Capillary Refill: Capillary refill takes less than 2 seconds.     Comments: See images of wounds from podiatry no earlier today.  Feet are very dirty.   Neurological:     Mental Status: He is alert.   Psychiatric:        Mood and Affect: Mood normal.     ED Results / Procedures / Treatments   Labs (all labs ordered are  listed, but only abnormal results are displayed) Labs Reviewed  COMPREHENSIVE METABOLIC PANEL WITH GFR - Abnormal; Notable for the following components:      Result Value   Sodium 132 (*)    CO2 20 (*)    Glucose, Bld 294 (*)    BUN 34 (*)    Creatinine, Ser 1.69 (*)    GFR, Estimated 44 (*)    All other components within normal limits  CBC WITH DIFFERENTIAL/PLATELET - Abnormal; Notable for the following components:   RBC 4.19 (*)    Hemoglobin 10.4 (*)    HCT 33.0 (*)    MCV 78.8 (*)    MCH 24.8 (*)    Platelets 419 (*)    All other components within normal limits  I-STAT CG4 LACTIC ACID, ED - Abnormal; Notable for the following components:   Lactic Acid, Venous 2.2 (*)    All other components within normal limits  CULTURE, BLOOD (ROUTINE X 2)  CULTURE, BLOOD (ROUTINE X 2)  SEDIMENTATION RATE  C-REACTIVE PROTEIN  HEMOGLOBIN A1C  LACTIC ACID, PLASMA    EKG None  Radiology DG Foot Complete Left Result Date: 02/02/2024 CLINICAL DATA:  Foot wound. EXAM: LEFT FOOT -  COMPLETE 3 VIEW COMPARISON:  02/02/2024, 4:15 p.m. FINDINGS: Soft tissue defect posterior to the metatarsals. Bony sulfur process first metatarsophalangeal joint could represent sequela of septic arthritis and/or osteomyelitis. Postop change in the proximal first proximal phalanx. No acute traumatic osseous abnormalities. IMPRESSION: Plantar soft tissue defect. Bony changes in the first metatarsophalangeal joint could represent sequela of septic arthritis and/or osteomyelitis. Electronically Signed   By: Sydell Eva M.D.   On: 02/02/2024 18:51   DG Foot Complete Left Result Date: 02/02/2024 Please see detailed radiograph report in office note.   Medications Ordered in ED Medications  Ampicillin-Sulbactam (UNASYN) 3 g in sodium chloride  0.9 % 100 mL IVPB (3 g Intravenous New Bag/Given 02/02/24 2152)    And  linezolid  (ZYVOX ) IVPB 600 mg (0 mg Intravenous Stopped 02/02/24 2043)  lactated ringers  bolus 1,000 mL  (1,000 mLs Intravenous New Bag/Given 02/02/24 2151)    ED Course/ Medical Decision Making/ A&P  Jeremiah Burns is a 68 y.o. male presents as detailed above  Differential ddx: Osteomyelitis, nonhealing diabetic ulcer, cellulitis, sepsis  On arrival, patient tachycardic otherwise afebrile hemodynamically stable no hypoxia or respiratory distress.  See media tab for images of wounds.  ED Work-up: Please see details of labs and imaging listed above. In summary, patient initiated on antibiotics (linezolid  and Unasyn)  Cultures obtained.  I-STAT lactic acid 2.2.  Will repeat after liter fluid bolus.  X-ray with possible osteomyelitis.  MRI completed but not read at time of admission.  Podiatry agrees to see patient tomorrow.   Overall impression osteomyelitis, diabetic foot wound Patient will be admitted by Hospitalist Dr. Brice Campi for further evaluation and treatment.   Patient seen with supervising physician who agrees with plan.  Final Clinical Impression(s) / ED Diagnoses Final diagnoses:  Diabetic foot infection (HCC)  Osteomyelitis of toe of left foot (HCC)   Angele Keller, DO PGY-3 Emergency Medicine    Angele Keller, DO 02/02/24 2241    Early Glisson, MD 02/03/24 [phone]

## 2024-02-02 NOTE — ED Notes (Signed)
 Pt to MRI

## 2024-02-02 NOTE — ED Triage Notes (Signed)
 Pt to ED from podiatry office c/o left foot wound. Ongoing problem, not currently on abx.

## 2024-02-02 NOTE — ED Provider Triage Note (Signed)
 Emergency Medicine Provider Triage Evaluation Note  Jeremiah Burns , a 68 y.o. male  was evaluated in triage.  Pt complains of right foot infection.  Sent from podiatry clinic.  Review of Systems  Positive: Pain, nausea, headache Negative: Vomiting, fever, chest pain, shortness of breath  Physical Exam  BP 94/67 (BP Location: Right Arm)   Pulse (!) 105   Temp 99.6 F (37.6 C)   Resp (!) 38   SpO2 100%  Gen:   Awake, ill-appearing Resp:  Normal effort, tachypneic, tachycardic MSK:   Moves extremities without difficulty  Other:  Wound to right foot  Medical Decision Making  Medically screening exam initiated at 6:10 PM.  Appropriate orders placed.  Jeremiah Burns was informed that the remainder of the evaluation will be completed by another provider, this initial triage assessment does not replace that evaluation, and the importance of remaining in the ED until their evaluation is complete.  Labs and imaging ordered   Jeremiah Burns 02/02/24 1610

## 2024-02-02 NOTE — H&P (Addendum)
 History and Physical    Jeremiah Burns ZOX:[id number] DOB: November 01, 1955 DOA: 02/02/2024  PCP: System, Provider Not In   Patient coming from: Home   Chief Complaint: Left foot ulcer   HPI: Jeremiah Burns is a 68 y.o. male with medical history significant for hypertension, insulin -dependent diabetes mellitus, PAD, CKD 3A, depression, and anxiety who presents from the podiatry clinic with worsening left foot ulcer.  Patient underwent angioplasty of the left posterior tibial artery on 11/20/2023 and had I&D with sesamoidectomy, bone biopsy, and placement of antibiotic beads and dermal allograft by podiatry on 11/21/2023.  He was seen today in the podiatry clinic where he was noted to have seropurulent drainage from the left foot ulcer which was probed to the bone.  He was sent to the ED.   ED Course: Upon arrival to the ED, patient is found to be afebrile and saturating well on room air with stable blood pressure.  Labs are most notable for glucose 294, creatinine 1.69, normal WBC, and lactic acid 2.2.  MRI is concerning for septic arthritis/osteomyelitis involving the first MTP joint.  Podiatry was consulted by the ED physician, blood cultures were collected, and the patient was given a liter of LR, Unasyn, and linezolid .  Review of Systems:  All other systems reviewed and apart from HPI, are negative.  Past Medical History:  Diagnosis Date   Diabetes mellitus [facility])     Past Surgical History:  Procedure Laterality Date   ABDOMINAL AORTOGRAM N/A 11/20/2023   Procedure: ABDOMINAL AORTOGRAM;  Surgeon: Young Hensen, MD;  Location: [facility] INVASIVE CV LAB;  Service: Cardiovascular;  Laterality: N/A;   ESOPHAGOGASTRODUODENOSCOPY N/A 12/14/2023   Procedure: EGD (ESOPHAGOGASTRODUODENOSCOPY);  Surgeon: Alvis Jourdain, MD;  Location: [facility] ENDOSCOPY;  Service: Gastroenterology;  Laterality: N/A;   ESOPHAGOGASTRODUODENOSCOPY N/A 12/16/2023   Procedure: EGD (ESOPHAGOGASTRODUODENOSCOPY);  Surgeon:  Nannette Babe, MD;  Location: [facility] ENDOSCOPY;  Service: Gastroenterology;  Laterality: N/A;   HEMOSTASIS CLIP PLACEMENT  12/14/2023   Procedure: CONTROL OF HEMORRHAGE, GI TRACT, ENDOSCOPIC, BY CLIPPING OR OVERSEWING;  Surgeon: Alvis Jourdain, MD;  Location: [facility] ENDOSCOPY;  Service: Gastroenterology;;   LOWER EXTREMITY ANGIOGRAPHY N/A 11/20/2023   Procedure: Lower Extremity Angiography;  Surgeon: Young Hensen, MD;  Location: [facility] INVASIVE CV LAB;  Service: Cardiovascular;  Laterality: N/A;   LOWER EXTREMITY INTERVENTION N/A 11/20/2023   Procedure: LOWER EXTREMITY INTERVENTION;  Surgeon: Young Hensen, MD;  Location: [facility] INVASIVE CV LAB;  Service: Cardiovascular;  Laterality: N/A;   METATARSAL HEAD EXCISION Left 11/21/2023   Procedure: EXCISION, METATARSAL BONE, HEAD;  Surgeon: Evertt Hoe, DPM;  Location: [facility] OR;  Service: Orthopedics/Podiatry;  Laterality: Left;  I&D, bone biopsy vs 1st met head resction, abx beads, graft application    Social History:   reports that he has never smoked. He has never used smokeless tobacco. He reports that he does not drink alcohol and does not use drugs.  Allergies  Allergen Reactions   Aspirin  Other (See Comments)   Codeine Other (See Comments)    History reviewed. No pertinent family history.   Prior to Admission medications   Medication Sig Start Date End Date Taking? Authorizing Provider  JARDIANCE 25 MG TABS tablet Take 25 mg by mouth daily. 01/28/24  Yes [provider]  LANTUS  SOLOSTAR 100 UNIT/ML Solostar Pen Inject 50 Units into the skin at bedtime. 02/01/24  Yes [provider]  acetaminophen  (TYLENOL ) 500 MG tablet Take 1,000 mg by mouth in the morning and at bedtime. For 4  days Patient not taking: Reported on 02/02/2024    [provider]  aspirin  EC 81 MG tablet Take 1 tablet (81 mg total) by mouth daily. Swallow whole. Patient not taking: Reported on 02/02/2024 11/29/23   Hoyt Macleod, MD   atorvastatin  (LIPITOR) 40 MG tablet Take 1 tablet (40 mg total) by mouth daily. Patient not taking: Reported on 02/02/2024 11/29/23   Hoyt Macleod, MD  clopidogrel  (PLAVIX ) 75 MG tablet Take 1 tablet (75 mg total) by mouth daily with breakfast. Patient not taking: Reported on 12/23/2023 11/29/23   Hoyt Macleod, MD  feeding supplement (ENSURE ENLIVE / ENSURE PLUS) LIQD Take 237 mLs by mouth 2 (two) times daily between meals. Patient not taking: Reported on 12/23/2023 11/28/23   Hoyt Macleod, MD  insulin  lispro (HUMALOG KWIKPEN) 100 UNIT/ML KwikPen Inject 0-12 Units into the skin 4 (four) times daily -  before meals and at bedtime. Per sliding scale, if 71-110= 0,  111-150= 3,  151-200= 4,  201-250= 6,  251-300= 8,  301-350= 10,  351-400= 12    [provider]  lisinopril (ZESTRIL) 10 MG tablet Take 1 tablet by mouth daily. Patient not taking: Reported on 12/23/2023 05/19/23   [provider]  ondansetron  (ZOFRAN ) 4 MG tablet Take 4 mg by mouth every 8 (eight) hours as needed for nausea or vomiting. Patient not taking: Reported on 12/23/2023    [provider]  oxyCODONE  (OXY IR/ROXICODONE ) 5 MG immediate release tablet Take 1 tablet (5 mg total) by mouth every 6 (six) hours as needed for moderate pain (pain score 4-6). Patient not taking: Reported on 12/23/2023 12/17/23   Rai, Hurman Maiden, MD  pantoprazole  (PROTONIX ) 40 MG tablet Take 1 tablet (40 mg total) by mouth 2 (two) times daily before a meal. Continue Protonix  40 mg twice daily for 8 weeks, then continue 40 mg daily indefinitely Patient not taking: Reported on 12/23/2023 12/17/23   Rai, Hurman Maiden, MD  polyethylene glycol (MIRALAX  / GLYCOLAX ) 17 g packet Take 17 g by mouth daily as needed. Patient not taking: Reported on 12/23/2023 11/28/23   Hoyt Macleod, MD  polymixin-bacitracin  (POLYSPORIN ) 500-10000 UNIT/GM OINT ointment Apply 1 Application topically every other day. Apply to left foot wound Patient not taking: Reported on  12/23/2023 12/18/23   Rai, Hurman Maiden, MD  QUEtiapine  (SEROQUEL ) 50 MG tablet Take 50 mg by mouth in the morning. Patient not taking: Reported on 12/23/2023 06/03/23   [provider]  SEMGLEE , YFGN, 100 UNIT/ML Pen Inject 12 Units into the skin 2 (two) times daily. 11/28/23   Dahal, Aminta Baldy, MD  senna-docusate (SENOKOT-S) 8.6-50 MG tablet Take 1 tablet by mouth 2 (two) times daily. Patient not taking: Reported on 12/23/2023 11/28/23   Hoyt Macleod, MD  sucralfate  (CARAFATE ) 1 GM/10ML suspension Take 10 mLs (1 g total) by mouth 4 (four) times daily -  with meals and at bedtime. Patient not taking: Reported on 12/23/2023 12/17/23 01/16/24  Loma Rising, MD    Physical Exam: Vitals:   02/02/24 1747 02/02/24 1811 02/02/24 2010 02/02/24 2011  BP: 94/67   (!) 141/95  Pulse: (!) 105   87  Resp: (!) 38   (!) 21  Temp: 99.6 F (37.6 C)   99.4 F (37.4 C)  TempSrc:    Oral  SpO2: 100%   100%  Weight:   81.6 kg   Height:  5' 11 (1.803 m) 5' 11 (1.803 m)     Constitutional: NAD, no pallor or diaphoresis  Eyes: PERTLA, lids and conjunctivae normal ENMT: Mucous membranes are moist. Posterior pharynx clear of any exudate or lesions.   Neck: supple, no masses  Respiratory: no wheezing, no crackles. No accessory muscle use.  Cardiovascular: S1 & S2 heard, regular rate and rhythm. No JVD. Abdomen: No tenderness, soft. Bowel sounds active.  Musculoskeletal: no clubbing / cyanosis. Ulceration at plantar aspect of left foot over 1st MTP with odor and drainage; eschar at lateral aspect 5th MTP.   Skin: Left foot findings above. Skin is otherwise warm and dry.  Neurologic: CN 2-12 grossly intact. Moving all extremities. Alert and oriented.  Psychiatric: Calm. Cooperative.    Labs and Imaging on Admission: I have personally reviewed following labs and imaging studies  CBC: Recent Labs  Lab 02/02/24 1830  WBC 9.5  NEUTROABS 6.5  HGB 10.4*  HCT 33.0*  MCV 78.8*  PLT 419*   Basic Metabolic  Panel: Recent Labs  Lab 02/02/24 1830  NA 132*  K 5.1  CL 101  CO2 20*  GLUCOSE 294*  BUN 34*  CREATININE 1.69*  CALCIUM  9.8   GFR: Estimated Creatinine Clearance: 45.2 mL/min (A) (by C-G formula based on SCr of 1.69 mg/dL (H)). Liver Function Tests: Recent Labs  Lab 02/02/24 1830  AST 18  ALT 19  ALKPHOS 58  BILITOT 0.4  PROT 7.7  ALBUMIN  3.8   No results for input(s): LIPASE, AMYLASE in the last 168 hours. No results for input(s): AMMONIA in the last 168 hours. Coagulation Profile: No results for input(s): INR, PROTIME in the last 168 hours. Cardiac Enzymes: No results for input(s): CKTOTAL, CKMB, CKMBINDEX, TROPONINI in the last 168 hours. BNP (last 3 results) No results for input(s): PROBNP in the last 8760 hours. HbA1C: No results for input(s): HGBA1C in the last 72 hours. CBG: No results for input(s): GLUCAP in the last 168 hours. Lipid Profile: No results for input(s): CHOL, HDL, LDLCALC, TRIG, CHOLHDL, LDLDIRECT in the last 72 hours. Thyroid Function Tests: No results for input(s): TSH, T4TOTAL, FREET4, T3FREE, THYROIDAB in the last 72 hours. Anemia Panel: No results for input(s): VITAMINB12, FOLATE, FERRITIN, TIBC, IRON, RETICCTPCT in the last 72 hours. Urine analysis:    Component Value Date/Time   COLORURINE YELLOW 12/14/2023 1104   APPEARANCEUR CLEAR 12/14/2023 1104   LABSPEC 1.021 12/14/2023 1104   PHURINE 5.0 12/14/2023 1104   GLUCOSEU >=500 (A) 12/14/2023 1104   HGBUR NEGATIVE 12/14/2023 1104   BILIRUBINUR NEGATIVE 12/14/2023 1104   KETONESUR NEGATIVE 12/14/2023 1104   PROTEINUR 30 (A) 12/14/2023 1104   NITRITE NEGATIVE 12/14/2023 1104   LEUKOCYTESUR NEGATIVE 12/14/2023 1104   Sepsis Labs: @LABRCNTIP (procalcitonin:4,lacticidven:4) )No results found for this or any previous visit (from the past 240 hours).   Radiological Exams on Admission: MR FOOT LEFT WO CONTRAST Result Date:  02/02/2024 EXAM DESCRIPTION: MR FOOT LEFT WO CONTRAST CLINICAL HISTORY: Eval osteomyelitis 1st ray COMPARISON: None Available. TECHNIQUE: MRI of the foot is performed according to our usual protocol with multiplanar multi sequence imaging. FINDINGS: There is a soft tissue wound/ulceration inferior to the first MTP joint. Moderate phlegmonous changes. Moderate area of susceptibility artifact within the soft tissues probably reflecting gas related to the ulcer. There is moderate to severe marrow edema as well as cortical destruction to the first metatarsal head and the proximal phalanx. Moderate joint effusion. Findings consistent with septic arthritis/osteomyelitis. The marrow signal is otherwise unremarkable. Moderate likely reactive diffuse myositis. Mild to moderate dorsal subcutaneous edema suggesting cellulitis. The tendons are unremarkable. IMPRESSION:  Soft tissue wound/ulceration inferior to the first MTP joint. Probable areas of gas with susceptibility artifact in the adjacent soft tissues. Findings consistent with septic arthritis/osteomyelitis to the first MTP joint as above. Likely reactive diffuse myositis. Suggestion of dorsal cellulitis. Electronically signed by: Basilio Both MD 02/02/2024 10:45 PM EDT RP Workstation: [id number]   DG Foot Complete Left Result Date: 02/02/2024 CLINICAL DATA:  Foot wound. EXAM: LEFT FOOT - COMPLETE 3 VIEW COMPARISON:  02/02/2024, 4:15 p.m. FINDINGS: Soft tissue defect posterior to the metatarsals. Bony sulfur process first metatarsophalangeal joint could represent sequela of septic arthritis and/or osteomyelitis. Postop change in the proximal first proximal phalanx. No acute traumatic osseous abnormalities. IMPRESSION: Plantar soft tissue defect. Bony changes in the first metatarsophalangeal joint could represent sequela of septic arthritis and/or osteomyelitis. Electronically Signed   By: Sydell Eva M.D.   On: 02/02/2024 18:51   DG Foot Complete  Left Result Date: 02/02/2024 Please see detailed radiograph report in office note.   Assessment/Plan   1. Diabetic left foot infection  - Continue current antibiotics, check ABI, follow-up on podiatry recommendations    2. CKD 3A  - SCr is 1.69 on admission; baseline appears closer to 1.4  - Hold lisinopril for now, renally-dose medications, monitor   3. Insulin -dependent DM  - A1c was 8.9% in April 2025  - Check CBGs, continue long- and short-acting insulin     4. Anxiety  - Seroquel    5. PAD  - S/p angioplasty of left posterior tibial artery on 11/20/23  - Hold antiplatelets in anticipation of surgery, continue Lipitor    DVT prophylaxis: SCDs  Code Status: Full  Level of Care: Level of care: Med-Surg Family Communication: None present  Disposition Plan:  Patient is from: home  Anticipated d/c is to: TBD Anticipated d/c date is: 02/06/24  Patient currently: Pending ABI, podiatry consultation, likely amputation Consults called: Podiatry  Admission status: Inpatient     Walton Guppy, MD [facility]  02/02/2024, 11:46 PM

## 2024-02-02 NOTE — Progress Notes (Addendum)
 Chief Complaint  Patient presents with   Diabetic Ulcer    DM Ulcer to the left foot at base of hallux. It was not bandaged, or protected. Shoes have hole and are open to dirt and air. It is swollen, and maybe getting worse. There is drainage present today and a strong odor. A1c was 9.6 in April. States he is not taking any medication at this time except his insulin .   Date of surgery: 11/21/2023 Procedure: 1.  Irrigation and debridement of ulceration to bone level with prep for graft, left foot 2.  Tibial sesamoidectomy, left foot 3.  Bone biopsy via trocar open superficial proximal phalanx and first metatarsal head, left foot 4.  Application of dissolvable antibiotic beads, left foot 5.  Application Integra bilayer dermal allograft, 3 x 2.5 cm, left foot  HPI: 68 y.o. male presents today for evaluation of left plantar foot ulceration.  It has been over a month since the patient was last seen, he was scheduled for appointment approximately 2 weeks ago which it appears he had canceled.  He presents with no dressings to the wound.  States that he had not had dressings applied since last clinic visit on 12/23/2023.  He reports pain to the area.  The foot appears dirty with ulceration draining.  He denies any nausea, vomiting, fever, chills, chest pain, shortness of breath  Past Medical History:  Diagnosis Date   Diabetes mellitus (HCC)     Past Surgical History:  Procedure Laterality Date   ABDOMINAL AORTOGRAM N/A 11/20/2023   Procedure: ABDOMINAL AORTOGRAM;  Surgeon: Gretta Lonni PARAS, MD;  Location: [facility] INVASIVE CV LAB;  Service: Cardiovascular;  Laterality: N/A;   AMPUTATION Left 02/04/2024   Procedure: AMPUTATION, FOOT, RAY;  Surgeon: Malvin Marsa FALCON, DPM;  Location: [facility] OR;  Service: Orthopedics/Podiatry;  Laterality: Left;  Left partial first ray amputation   ESOPHAGOGASTRODUODENOSCOPY N/A 12/14/2023   Procedure: EGD (ESOPHAGOGASTRODUODENOSCOPY);  Surgeon: Rollin Dover,  MD;  Location: [facility] ENDOSCOPY;  Service: Gastroenterology;  Laterality: N/A;   ESOPHAGOGASTRODUODENOSCOPY N/A 12/16/2023   Procedure: EGD (ESOPHAGOGASTRODUODENOSCOPY);  Surgeon: Albertus Gordy HERO, MD;  Location: [facility] ENDOSCOPY;  Service: Gastroenterology;  Laterality: N/A;   HEMOSTASIS CLIP PLACEMENT  12/14/2023   Procedure: CONTROL OF HEMORRHAGE, GI TRACT, ENDOSCOPIC, BY CLIPPING OR OVERSEWING;  Surgeon: Rollin Dover, MD;  Location: [facility] ENDOSCOPY;  Service: Gastroenterology;;   LOWER EXTREMITY ANGIOGRAPHY N/A 11/20/2023   Procedure: Lower Extremity Angiography;  Surgeon: Gretta Lonni PARAS, MD;  Location: [facility] INVASIVE CV LAB;  Service: Cardiovascular;  Laterality: N/A;   LOWER EXTREMITY INTERVENTION N/A 11/20/2023   Procedure: LOWER EXTREMITY INTERVENTION;  Surgeon: Gretta Lonni PARAS, MD;  Location: [facility] INVASIVE CV LAB;  Service: Cardiovascular;  Laterality: N/A;   METATARSAL HEAD EXCISION Left 11/21/2023   Procedure: EXCISION, METATARSAL BONE, HEAD;  Surgeon: Malvin Marsa FALCON, DPM;  Location: [facility] OR;  Service: Orthopedics/Podiatry;  Laterality: Left;  I&D, bone biopsy vs 1st met head resction, abx beads, graft application    Allergies  Allergen Reactions   Aspirin  Other (See Comments)   Codeine Other (See Comments)    ROS negative except as stated in HPI   Physical Exam: There were no vitals filed for this visit.  General: The patient is alert and oriented x3 in no acute distress.  Dermatology: Left foot ulceration present at plantar base of first toe.  Predebridement measures 3 x 1.5 x 1.5 cm at widest margins though the primary area is closer to 2 x  1.5 cm.  Significant dirt and debris is noted to the periwound area.  The wound probes down to bone.  There is mild foul-smelling drainage as well as some bleeding present.  Postdebridement measurements 3.2 x 1.8 x 1.5 cm.  Left lateral fifth met head there is unstageable pressure injury measuring 2 x 1.5 cm.  Vascular: Foot appears  warm and well-perfused.  Capillary refill intact to the digits.  No calf pain with palpation.  Neurological: Epicritic sensation diminished.  Musculoskeletal Exam: Status post I&D of left plantar first MPJ with tibial sesamoidectomy  Radiographic Exam: Left foot 3 views weightbearing 02/02/2024 Osseous changes are seen status post bone biopsy of proximal phalanx.  There does appear to be increase cortical erosion and osseous erosion to the first metatarsal head and base of the proximal phalanx.  Vascular calcifications present.  No soft tissue emphysema noted  Assessment/Plan of Care: 1. Acute osteomyelitis of left ankle or foot (HCC)   2. 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 Pressure injury of left foot, unstageable (HCC)      No orders of the defined types were placed in this encounter.  None  Discussed clinical findings with patient today.  Radiographs reviewed with patient  Deep wound culture obtained from left first metatarsal head ulceration  Medically necessary excisional debridement of nonviable skin, periwound callus, subcutaneous tissue of left subfirst metatarsal head ulceration was performed today using microcurette and 312 scalpel blade.  The ulceration was then packed with Betadine gauze and dressing applied.  Xeroform applied to the left fifth met head ulceration.  Discussed findings concerning for osteomyelitis.  This does appear to have progressed from the immediate postoperative radiographs as well as with some seropurulent drainage with ulceration probing to bone.  I instructed the patient to go to the hospital for admission.  He will need infectious workup, MRI, noninvasive vascular studies.  He did have vascular intervention during recent admission.  Did discuss with patient that he will likely need partial first ray amputation.   Ashtian Villacis L. Lamount MAUL, AACFAS [facility]     [address] [place], [place] [address]                Office [phone]  Fax [phone]

## 2024-02-03 ENCOUNTER — Encounter (HOSPITAL_COMMUNITY)

## 2024-02-03 DIAGNOSIS — M869 Osteomyelitis, unspecified: Secondary | ICD-10-CM

## 2024-02-03 DIAGNOSIS — L089 Local infection of the skin and subcutaneous tissue, unspecified: Secondary | ICD-10-CM | POA: Diagnosis not present

## 2024-02-03 DIAGNOSIS — E11628 Type 2 diabetes mellitus with other skin complications: Secondary | ICD-10-CM | POA: Diagnosis not present

## 2024-02-03 LAB — LACTIC ACID, PLASMA: Lactic Acid, Venous: 1.6 mmol/L (ref 0.5–1.9)

## 2024-02-03 LAB — CBC
HCT: 30 % — ABNORMAL LOW (ref 39.0–52.0)
Hemoglobin: 9.4 g/dL — ABNORMAL LOW (ref 13.0–17.0)
MCH: 24.4 pg — ABNORMAL LOW (ref 26.0–34.0)
MCHC: 31.3 g/dL (ref 30.0–36.0)
MCV: 77.7 fL — ABNORMAL LOW (ref 80.0–100.0)
Platelets: 343 10*3/uL (ref 150–400)
RBC: 3.86 MIL/uL — ABNORMAL LOW (ref 4.22–5.81)
RDW: 14.4 % (ref 11.5–15.5)
WBC: 7.3 10*3/uL (ref 4.0–10.5)
nRBC: 0 % (ref 0.0–0.2)

## 2024-02-03 LAB — BASIC METABOLIC PANEL WITH GFR
Anion gap: 10 (ref 5–15)
BUN: 29 mg/dL — ABNORMAL HIGH (ref 8–23)
CO2: 20 mmol/L — ABNORMAL LOW (ref 22–32)
Calcium: 8.9 mg/dL (ref 8.9–10.3)
Chloride: 104 mmol/L (ref 98–111)
Creatinine, Ser: 1.29 mg/dL — ABNORMAL HIGH (ref 0.61–1.24)
GFR, Estimated: >60 mL/min (ref >60)
Glucose, Bld: 158 mg/dL — ABNORMAL HIGH (ref 70–99)
Potassium: 4 mmol/L (ref 3.5–5.1)
Sodium: 134 mmol/L — ABNORMAL LOW (ref 135–145)

## 2024-02-03 LAB — CBG MONITORING, ED
Glucose-Capillary: 220 mg/dL — ABNORMAL HIGH (ref 70–99)
Glucose-Capillary: 291 mg/dL — ABNORMAL HIGH (ref 70–99)

## 2024-02-03 LAB — GLUCOSE, CAPILLARY
Glucose-Capillary: 141 mg/dL — ABNORMAL HIGH (ref 70–99)
Glucose-Capillary: 235 mg/dL — ABNORMAL HIGH (ref 70–99)
Glucose-Capillary: 195 mg/dL — ABNORMAL HIGH (ref 70–99)
Glucose-Capillary: 200 mg/dL — ABNORMAL HIGH (ref 70–99)

## 2024-02-03 LAB — SEDIMENTATION RATE: Sed Rate: 54 mm/h — ABNORMAL HIGH (ref 0–16)

## 2024-02-03 MED ORDER — SENNOSIDES-DOCUSATE SODIUM 8.6-50 MG PO TABS
1.0000 | ORAL_TABLET | Freq: Two times a day (BID) | ORAL | Status: DC
  Administered 2024-02-05 – 2024-02-09 (×9): 1 via ORAL
  Filled 2024-02-03 (×12): qty 1

## 2024-02-03 MED ORDER — LIVING WELL WITH DIABETES BOOK
Freq: Once | Status: AC
  Filled 2024-02-03: qty 1

## 2024-02-03 MED ORDER — SODIUM CHLORIDE 0.9 % IV SOLN
3.0000 g | Freq: Three times a day (TID) | INTRAVENOUS | Status: DC
  Administered 2024-02-03 – 2024-02-05 (×7): 3 g via INTRAVENOUS
  Filled 2024-02-03 (×7): qty 8

## 2024-02-03 NOTE — Progress Notes (Signed)
 [facility] Progress Note Patient: Jeremiah Burns GMW:[id number] DOB: [birth year] DOA: 02/02/2024  DOS: the patient was seen and examined on 02/03/2024  Brief Hospital Course: Patient with PMH of HTN, type II DM, PAD, CKD 3A, depression, anxiety presented to the hospital with complaints of worsening left foot ulcer. 4/3 underwent angioplasty of the left posterior tibial artery  4/4 I and D with sesamoidectomy.  Bone biopsy and dermal allograft by podiatry 4/11 discharge from the hospital on Zyvox  and Bactrim .  EOT 4/25 4/30 hospitalization for hematemesis.  AKI on CKD 3B 6/16 seen in the clinic for a follow-up.  Deep wound cultures were obtained.  Concern for osteomyelitis and therefore was sent to the hospital for evaluation and intervention.  Scheduled for ray amputation on 6/18. Assessment and Plan: Osteomyelitis of the left foot with diabetes. Podiatry surgery consult appreciated. Scheduled for surgery tomorrow. Currently holding off on DVT prophylaxis. Currently receiving IV Zyvox  and IV Unasyn. Prior history of polymicrobial infection in his wound. Was seen by ID in the past.  Depending on the surgical margin we will decide whether ID consultation is indicated or not.  CKD 3A. Baseline creatinine 1.4.  On admission serum creatinine 1.69. Monitor for now. Holding lisinopril.  Type 2 diabetes mellitus, uncontrolled with hyperglycemia with insulin  use with PAD and diabetic foot infection. Hemoglobin A1c 8.9. Currently on sliding scale insulin  as well as basal insulin . Monitor.  PAD. S/p angioplasty of the left posterior tibial artery on 4/3. Not on any antiplatelet medication prior to admission. Patient was on aspirin  and Plavix  but this was stopped due to GI bleed admission 9/30. Aspirin  was resumed on discharge.  Now it is added to his allergy list! Care everywhere review from [facility]-patient's PCP record-also does not show any presence of antiplatelet medication on his  list. Recommended discussed with the patient and resume if no contraindication after the surgery.  Anxiety and depression. On Seroquel  at home.  For now continued.  Pain control. Patient reports severe pain in his leg. Continue with pain control for now.  Subjective: Reports ongoing pain in his left foot ulcer.  No nausea no vomiting.  No fever no chills.  Physical Exam: General: in Mild distress, No Rash Cardiovascular: S1 and S2 Present, No Murmur Respiratory: Good respiratory effort, Bilateral Air entry present. No Crackles, No wheezes Abdomen: Bowel Sound present, No tenderness Extremities: No edema of leg.  Left foot first MTP ulceration.  No drainage seen so far. Neuro: Alert and oriented x3, no new focal deficit  Data Reviewed: I have Reviewed nursing notes, Vitals, and Lab results. Since last encounter, pertinent lab results CBC and BMP   . I have ordered test including CBC and BMP  . I have discussed pt's care plan and test results with podiatry  .   Disposition: Status is: Inpatient Remains inpatient appropriate because: Monitor for postop recovery.  Surgery scheduled for tomorrow.  SCDs Start: 02/02/24 2345   Family Communication: No one at bedside Level of care: Med-Surg   Vitals:   02/03/24 0116 02/03/24 0400 02/03/24 0902 02/03/24 1705  BP: (!) 153/96 (!) 149/92 (!) 158/99 (!) 149/94  Pulse: (!) 102 94 80 88  Resp: 18 16 18 18   Temp: 98 F (36.7 C) 98.7 F (37.1 C) 97.9 F (36.6 C) 98 F (36.7 C)  TempSrc: Oral Oral Oral Oral  SpO2: 98% 96% 100% 98%  Weight:      Height:         Author: Charlean Congress, MD  02/03/2024 7:12 PM  Please look on [email or web address] to find out who is on call.

## 2024-02-03 NOTE — Consult Note (Signed)
 PODIATRY CONSULTATION  NAME Jeremiah Burns MRN [id number] DOB July 29, 1956 DOA 02/02/2024   Reason for consult:  Chief Complaint  Patient presents with   Foot Problem    Attending/Consulting physician: Margherita Shell MD  History of present illness: Jeremiah Burns is a 68 y.o. male with medical history significant for hypertension, insulin -dependent diabetes mellitus, PAD, CKD 3A, depression, and anxiety who presents from the podiatry clinic with worsening left foot ulcer.   Patient underwent angioplasty of the left posterior tibial artery on 11/20/2023 and had I&D with sesamoidectomy, bone biopsy, and placement of antibiotic beads and dermal allograft by podiatry on 11/21/2023.   He was seen today in the podiatry clinic where he was noted to have seropurulent drainage from the left foot ulcer which was probed to the bone.  He was sent to the ED.  Pt known to me from prior admission / OR. I last saw him 12/23/23 and referred him back to [place]. Was supposed to follow up in 2 weeks from then but didn't follow up until yesterday for unknown reasons. Yesterday Dr. Marvis Sluder was concerned re worsening erosive changes in the first met head. Sent to ED for MRI and IV abx with plan for likely surgery.  Was noted yesterday to have been walking on the foot barefoot at home with significant debris and dirt on the plantar foot.   Past Medical History:  Diagnosis Date   Diabetes mellitus (HCC)        Latest Ref Rng & Units 02/03/2024    5:27 AM 02/02/2024    6:30 PM 12/17/2023   10:55 AM  CBC  WBC 4.0 - 10.5 K/uL 7.3  9.5  6.8   Hemoglobin 13.0 - 17.0 g/dL 9.4  40.9  9.0   Hematocrit 39.0 - 52.0 % 30.0  33.0  27.0   Platelets 150 - 400 K/uL 343  419  156        Latest Ref Rng & Units 02/03/2024    5:27 AM 02/02/2024    6:30 PM 12/17/2023   10:55 AM  BMP  Glucose 70 - 99 mg/dL 811  914  782   BUN 8 - 23 mg/dL 29  34  24   Creatinine 0.61 - 1.24 mg/dL 9.56  2.13  0.86   Sodium 135 - 145 mmol/L 134   132  138   Potassium 3.5 - 5.1 mmol/L 4.0  5.1  4.2   Chloride 98 - 111 mmol/L 104  101  107   CO2 22 - 32 mmol/L 20  20  25    Calcium  8.9 - 10.3 mg/dL 8.9  9.8  9.0       Physical Exam: Lower Extremity Exam Dp and Pt pulses weakly palpable LLE Ulceration plantar aspect 1st mpj with erythema and edema of the joint  Eschar lateral apect of the 5th met head  Sensation diminished to toes  S/p tibial sesamoidectomy        ASSESSMENT/PLAN OF CARE 68 y.o. male with PMHx significant for  hypertension, insulin -dependent diabetes mellitus, PAD, CKD 3A, depression, and anxiety with ulceration plantar 1st MPJ left foot with underlying osteomyelitis of the metatarsal head and proximal phalanx.   WBC 7.3 A1c 9.2  MRI L foot WO contrast: Soft tissue wound/ulceration inferior to the first MTP joint. Probable areas of gas with susceptibility artifact in the adjacent soft tissues.  Findings consistent with septic arthritis/osteomyelitis to the first MTP joint as above.    - Discussed above findings with patient. Recommend  partial first ray amputation left foot. He states he wants to think about it but I said well plan as if were going to proceed. Discussed given severity of infection about the 1st MPJ I can not safely recommend IV abx therapy alone. NPO past MN for OR tomorrow.  - Continue IV abx broad spectrum pending further culture data - Anticoagulation: please hold pre op  - Wound care: Betadine wet to dry pre op - WB status: WBAT pre op - Will continue to follow   Thank you for the consult.  Please contact me directly with any questions or concerns.           Maridee Shoemaker, DPM [facility] / [facility]    [address] [place], [place] [address]                Office [phone]  Fax [phone]

## 2024-02-03 NOTE — Plan of Care (Signed)

## 2024-02-03 NOTE — TOC CM/SW Note (Signed)
 Transition of Care [facility]) - Inpatient Brief Assessment   Patient Details  Name: Jeremiah Burns MRN: [id number] Date of Birth: 1955/12/31  Transition of Care [facility]) CM/SW Contact:    Tom-Johnson, Angelique Ken, RN Phone Number: 02/03/2024, 3:35 PM   Clinical Narrative:  Patient presented to the ED from his Podiatry Office with worsening Lt Foot Wound.  Patient recently underwent Angioplasty of the Lt Posterior Tibial Artery on 11/20/2023 and had I&D with Sesamoidectomy, Bone Biopsy, and placement of Antibiotic Beads and Dermal Allograft by Podiatry on 11/21/2023.  Inpatient Podiatry consulted, plan for Partial First Ray Amputation to Lt Foot tomorrow 02/04/24.   From home alone, has three children that lives out of town. Independent with his care and drive self to the ED. Has a cane, walker, shower seat and grab bars at home. PCP is [place], Sarah T, MD and uses [facility] in [place].   Patient not Medically ready for discharge.  CM will continue to follow as patient progresses with care towards discharge.        Transition of Care Asessment: Insurance and Status: Insurance coverage has been reviewed Patient has primary care physician: Yes Home environment has been reviewed: Yes Prior level of function:: Modified Independent Prior/Current Home Services: No current home services Social Drivers of Health Review: SDOH reviewed no interventions necessary Readmission risk has been reviewed: Yes Transition of care needs: transition of care needs identified, TOC will continue to follow

## 2024-02-03 NOTE — ED Notes (Addendum)
resting

## 2024-02-03 NOTE — Inpatient Diabetes Management (Signed)
 Inpatient Diabetes Program Recommendations  AACE/ADA: New Consensus Statement on Inpatient Glycemic Control (2015)  Target Ranges:  Prepandial:   less than 140 mg/dL      Peak postprandial:   less than 180 mg/dL (1-2 hours)      Critically ill patients:  140 - 180 mg/dL   Lab Results  Component Value Date   GLUCAP 235 (H) 02/03/2024   HGBA1C 9.2 (H) 02/02/2024    Latest Reference Range & Units 02/03/24 00:01 02/03/24 03:05 02/03/24 09:00 02/03/24 11:31  Glucose-Capillary 70 - 99 mg/dL 161 (H) 096 (H) 045 (H) 235 (H)  (H): Data is abnormally high  Diabetes history: DM2 Outpatient Diabetes medications:  Semglee  25 units bid Humalog 0-12 units qid Jardiance 25 mg daily  Current orders for Inpatient glycemic control: Semglee  12 units nightly Novolog  0-9 units q 4 hrs. correction  Inpatient Diabetes Program Recommendations:   Spoke with pt about A1C results 9.2 (average blood glucose 217 over the past 2-3 months) and explained what an A1C is, basic pathophysiology of DM Type 2, basic home care, basic diabetes diet nutrition principles, importance of checking CBGs and maintaining good CBG control to prevent long-term and short-term complications. Reviewed signs and symptoms of hyperglycemia and hypoglycemia and how to treat hypoglycemia at home. Also reviewed blood sugar goals at home.  RNs to provide ongoing basic DM education at bedside with this patient. Ordered Living Well With Diabetes booklet for patient review. Patient states he does drink regular sugary sodas @ home and 3-4 gallons of milk per week. Discussed options to eliminate sugary drinks from diet and limit amount of milk. Patient expressed concern it's too late since I'm going for surgery tomorrow. Reviewed with patient healing will be more optimum with controlled blood sugars. Patient nodded in agreement.  Thank you, Herminia Warren E. Amos Gaber, RN, MSN, CDCES  Diabetes Coordinator Inpatient Glycemic Control Team Team Pager  [phone] (8am-5pm) 02/03/2024 2:09 PM

## 2024-02-04 ENCOUNTER — Inpatient Hospital Stay (HOSPITAL_COMMUNITY): Admitting: Anesthesiology

## 2024-02-04 ENCOUNTER — Other Ambulatory Visit: Payer: Self-pay

## 2024-02-04 ENCOUNTER — Encounter (HOSPITAL_COMMUNITY)
Admission: EM | Disposition: A | Discharge status: Home Health | Payer: Self-pay | Source: Home / Self Care | Attending: Internal Medicine

## 2024-02-04 ENCOUNTER — Inpatient Hospital Stay (HOSPITAL_COMMUNITY)

## 2024-02-04 ENCOUNTER — Encounter (HOSPITAL_COMMUNITY)

## 2024-02-04 DIAGNOSIS — L97522 Non-pressure chronic ulcer of other part of left foot with fat layer exposed: Secondary | ICD-10-CM | POA: Diagnosis not present

## 2024-02-04 DIAGNOSIS — E1169 Type 2 diabetes mellitus with other specified complication: Secondary | ICD-10-CM

## 2024-02-04 DIAGNOSIS — L089 Local infection of the skin and subcutaneous tissue, unspecified: Secondary | ICD-10-CM | POA: Diagnosis not present

## 2024-02-04 DIAGNOSIS — M869 Osteomyelitis, unspecified: Secondary | ICD-10-CM

## 2024-02-04 DIAGNOSIS — E11628 Type 2 diabetes mellitus with other skin complications: Secondary | ICD-10-CM | POA: Diagnosis not present

## 2024-02-04 DIAGNOSIS — I129 Hypertensive chronic kidney disease with stage 1 through stage 4 chronic kidney disease, or unspecified chronic kidney disease: Secondary | ICD-10-CM

## 2024-02-04 DIAGNOSIS — N1831 Chronic kidney disease, stage 3a: Secondary | ICD-10-CM

## 2024-02-04 HISTORY — PX: AMPUTATION: SHX166

## 2024-02-04 LAB — GLUCOSE, CAPILLARY
Glucose-Capillary: 108 mg/dL — ABNORMAL HIGH (ref 70–99)
Glucose-Capillary: 125 mg/dL — ABNORMAL HIGH (ref 70–99)
Glucose-Capillary: 128 mg/dL — ABNORMAL HIGH (ref 70–99)
Glucose-Capillary: 144 mg/dL — ABNORMAL HIGH (ref 70–99)
Glucose-Capillary: 154 mg/dL — ABNORMAL HIGH (ref 70–99)
Glucose-Capillary: 156 mg/dL — ABNORMAL HIGH (ref 70–99)
Glucose-Capillary: 235 mg/dL — ABNORMAL HIGH (ref 70–99)
Glucose-Capillary: 84 mg/dL (ref 70–99)

## 2024-02-04 LAB — CBC
HCT: 29.4 % — ABNORMAL LOW (ref 39.0–52.0)
Hemoglobin: 9 g/dL — ABNORMAL LOW (ref 13.0–17.0)
MCH: 24.4 pg — ABNORMAL LOW (ref 26.0–34.0)
MCHC: 30.6 g/dL (ref 30.0–36.0)
MCV: 79.7 fL — ABNORMAL LOW (ref 80.0–100.0)
Platelets: 329 10*3/uL (ref 150–400)
RBC: 3.69 MIL/uL — ABNORMAL LOW (ref 4.22–5.81)
RDW: 14.2 % (ref 11.5–15.5)
WBC: 7.2 10*3/uL (ref 4.0–10.5)
nRBC: 0 % (ref 0.0–0.2)

## 2024-02-04 LAB — MRSA NEXT GEN BY PCR, NASAL: MRSA by PCR Next Gen: NOT DETECTED

## 2024-02-04 LAB — BASIC METABOLIC PANEL WITH GFR
Anion gap: 8 (ref 5–15)
BUN: 25 mg/dL — ABNORMAL HIGH (ref 8–23)
CO2: 22 mmol/L (ref 22–32)
Calcium: 8.7 mg/dL — ABNORMAL LOW (ref 8.9–10.3)
Chloride: 103 mmol/L (ref 98–111)
Creatinine, Ser: 1.37 mg/dL — ABNORMAL HIGH (ref 0.61–1.24)
GFR, Estimated: 57 mL/min — ABNORMAL LOW (ref >60)
Glucose, Bld: 155 mg/dL — ABNORMAL HIGH (ref 70–99)
Potassium: 4 mmol/L (ref 3.5–5.1)
Sodium: 133 mmol/L — ABNORMAL LOW (ref 135–145)

## 2024-02-04 SURGERY — AMPUTATION, FOOT, RAY
Anesthesia: Monitor Anesthesia Care | Laterality: Left

## 2024-02-04 MED ORDER — BUPIVACAINE HCL (PF) 0.5 % IJ SOLN
INTRAMUSCULAR | Status: DC | PRN
  Administered 2024-02-04: 5 mL

## 2024-02-04 MED ORDER — LIDOCAINE HCL (PF) 1 % IJ SOLN
INTRAMUSCULAR | Status: DC | PRN
  Administered 2024-02-04: 5 mL

## 2024-02-04 MED ORDER — PROPOFOL 10 MG/ML IV BOLUS
INTRAVENOUS | Status: DC | PRN
  Administered 2024-02-04: 50 mg via INTRAVENOUS

## 2024-02-04 MED ORDER — SODIUM CHLORIDE 0.9 % IR SOLN
Status: DC | PRN
  Administered 2024-02-04: 1000 mL

## 2024-02-04 MED ORDER — FENTANYL CITRATE (PF) 250 MCG/5ML IJ SOLN
INTRAMUSCULAR | Status: DC | PRN
  Administered 2024-02-04: 100 ug via INTRAVENOUS

## 2024-02-04 MED ORDER — ONDANSETRON HCL 4 MG/2ML IJ SOLN
INTRAMUSCULAR | Status: DC | PRN
  Administered 2024-02-04: 4 mg via INTRAVENOUS

## 2024-02-04 MED ORDER — CHLORHEXIDINE GLUCONATE 0.12 % MT SOLN
OROMUCOSAL | Status: AC
  Administered 2024-02-04: 15 mL via OROMUCOSAL
  Filled 2024-02-04: qty 15

## 2024-02-04 MED ORDER — PROPOFOL 500 MG/50ML IV EMUL
INTRAVENOUS | Status: DC | PRN
  Administered 2024-02-04: 100 ug/kg/min via INTRAVENOUS

## 2024-02-04 MED ORDER — LACTATED RINGERS IV SOLN
INTRAVENOUS | Status: DC | PRN
Start: 2024-02-04 — End: 2024-02-04

## 2024-02-04 MED ORDER — CHLORHEXIDINE GLUCONATE 0.12 % MT SOLN: 15.0000 mL | Freq: Once | OROMUCOSAL | Status: AC

## 2024-02-04 MED ORDER — ADULT MULTIVITAMIN W/MINERALS CH
1.0000 | ORAL_TABLET | Freq: Every day | ORAL | Status: DC
  Administered 2024-02-04 – 2024-02-09 (×6): 1 via ORAL
  Filled 2024-02-04 (×6): qty 1

## 2024-02-04 MED ORDER — PHENYLEPHRINE 80 MCG/ML (10ML) SYRINGE FOR IV PUSH (FOR BLOOD PRESSURE SUPPORT)
PREFILLED_SYRINGE | INTRAVENOUS | Status: DC | PRN
  Administered 2024-02-04 (×2): 160 ug via INTRAVENOUS

## 2024-02-04 MED ORDER — LACTATED RINGERS IV SOLN: INTRAVENOUS | Status: DC

## 2024-02-04 MED ORDER — FENTANYL CITRATE (PF) 250 MCG/5ML IJ SOLN
INTRAMUSCULAR | Status: AC
  Filled 2024-02-04: qty 5

## 2024-02-04 MED ORDER — LIDOCAINE HCL (PF) 1 % IJ SOLN
INTRAMUSCULAR | Status: AC
  Filled 2024-02-04: qty 30

## 2024-02-04 MED ORDER — PROPOFOL 10 MG/ML IV BOLUS
INTRAVENOUS | Status: AC
  Filled 2024-02-04: qty 20

## 2024-02-04 MED ORDER — PHENYLEPHRINE HCL-NACL 20-0.9 MG/250ML-% IV SOLN: INTRAVENOUS | Status: DC | PRN

## 2024-02-04 MED ORDER — BUPIVACAINE HCL (PF) 0.5 % IJ SOLN
INTRAMUSCULAR | Status: AC
  Filled 2024-02-04: qty 10

## 2024-02-04 MED ORDER — ENSURE PLUS HIGH PROTEIN PO LIQD
237.0000 mL | Freq: Two times a day (BID) | ORAL | Status: DC
  Administered 2024-02-05 – 2024-02-09 (×9): 237 mL via ORAL

## 2024-02-04 MED ORDER — EPHEDRINE SULFATE (PRESSORS) 50 MG/ML IJ SOLN
INTRAMUSCULAR | Status: DC | PRN
  Administered 2024-02-04: 10 mg via INTRAVENOUS

## 2024-02-04 MED ORDER — ORAL CARE MOUTH RINSE
15.0000 mL | Freq: Once | OROMUCOSAL | Status: AC
Start: 2024-02-04 — End: 2024-02-04

## 2024-02-04 SURGICAL SUPPLY — 40 items
BLADE AVERAGE 25X9 (BLADE) IMPLANT
BLADE SURG 10 STRL SS (BLADE) ×1 IMPLANT
BLADE SURG 15 STRL LF DISP TIS (BLADE) ×1 IMPLANT
BNDG COHESIVE 3X5 TAN ST LF (GAUZE/BANDAGES/DRESSINGS) ×1 IMPLANT
BNDG COMPR ESMARK 4X3 LF (GAUZE/BANDAGES/DRESSINGS) ×1 IMPLANT
BNDG ELASTIC 3INX 5YD STR LF (GAUZE/BANDAGES/DRESSINGS) ×1 IMPLANT
BNDG ELASTIC 4INX 5YD STR LF (GAUZE/BANDAGES/DRESSINGS) IMPLANT
BNDG GAUZE DERMACEA FLUFF 4 (GAUZE/BANDAGES/DRESSINGS) IMPLANT
BNDG STRETCH 4X75 STRL LF (GAUZE/BANDAGES/DRESSINGS) IMPLANT
CHLORAPREP W/TINT 26 (MISCELLANEOUS) IMPLANT
DRSG ADAPTIC 3X8 NADH LF (GAUZE/BANDAGES/DRESSINGS) IMPLANT
DRSG XEROFORM 1X8 (GAUZE/BANDAGES/DRESSINGS) IMPLANT
ELECTRODE REM PT RTRN 9FT ADLT (ELECTROSURGICAL) ×1 IMPLANT
GAUZE PAD ABD 7.5X8 STRL (GAUZE/BANDAGES/DRESSINGS) IMPLANT
GAUZE PAD ABD 8X10 STRL (GAUZE/BANDAGES/DRESSINGS) IMPLANT
GAUZE SPONGE 2X2 STRL 8-PLY (GAUZE/BANDAGES/DRESSINGS) IMPLANT
GAUZE SPONGE 4X4 12PLY STRL (GAUZE/BANDAGES/DRESSINGS) ×1 IMPLANT
GAUZE STRETCH 2X75IN STRL (MISCELLANEOUS) ×1 IMPLANT
GAUZE XEROFORM 1X8 LF (GAUZE/BANDAGES/DRESSINGS) ×1 IMPLANT
GLOVE BIO SURGEON STRL SZ7.5 (GLOVE) ×1 IMPLANT
GLOVE BIOGEL PI IND STRL 7.5 (GLOVE) ×1 IMPLANT
GOWN STRL REUS W/ TWL LRG LVL3 (GOWN DISPOSABLE) ×2 IMPLANT
GRAFT SKIN WND SURGIBIND 3X7 (Tissue) IMPLANT
KIT BASIN OR (CUSTOM PROCEDURE TRAY) ×1 IMPLANT
NDL HYPO 25X1 1.5 SAFETY (NEEDLE) ×1 IMPLANT
NEEDLE HYPO 25X1 1.5 SAFETY (NEEDLE) ×1 IMPLANT
PACK ORTHO EXTREMITY (CUSTOM PROCEDURE TRAY) ×1 IMPLANT
PADDING CAST ABS COTTON 4X4 ST (CAST SUPPLIES) ×2 IMPLANT
SET HNDPC FAN SPRY TIP SCT (DISPOSABLE) IMPLANT
SPIKE FLUID TRANSFER (MISCELLANEOUS) IMPLANT
STAPLER SKIN PROX 35W (STAPLE) IMPLANT
STOCKINETTE 4X48 STRL (DRAPES) IMPLANT
SUT ETHILON 3 0 FSLX (SUTURE) IMPLANT
SUT PROLENE 3 0 PS 2 (SUTURE) IMPLANT
SUT PROLENE 4 0 PS 2 18 (SUTURE) IMPLANT
SYR CONTROL 10ML LL (SYRINGE) ×1 IMPLANT
TUBE CONNECTING 12X1/4 (SUCTIONS) IMPLANT
UNDERPAD 30X36 HEAVY ABSORB (UNDERPADS AND DIAPERS) ×1 IMPLANT
WATER STERILE IRR 1000ML POUR (IV SOLUTION) ×1 IMPLANT
YANKAUER SUCT BULB TIP NO VENT (SUCTIONS) IMPLANT

## 2024-02-04 NOTE — Op Note (Signed)
 Full Operative Report  Date of Operation: 12:10 PM, 02/04/2024   Patient: Jeremiah Burns - 68 y.o. male  Surgeon: Evertt Hoe, DPM   Assistant: None  Diagnosis: Osteomyelitis of left first metatarsal and great toe  Procedure:  1. Partial first ray amputation left foot 2. Irrigation and excisional debridement of ulceration lateral 5th metatarsal head with prep for graft, left foot 3. Application dermal allograft 2 x 1.5 cm, left foot     Anesthesia: Monitor Anesthesia Care  Earvin Goldberg, MD  Anesthesiologist: Earvin Goldberg, MD CRNA: Loreda Rodriguez, CRNA   Estimated Blood Loss: Minimal 10 mL   Hemostasis: 1) Anatomical dissection, mechanical compression, electrocautery 2) No tourniquet was used  Implants: Implant Name Type Inv. Item Serial No. Manufacturer Lot No. LRB No. Used Action  GRAFT SKIN WND SURGIBIND 3X7 - [id number] Tissue GRAFT SKIN WND SURGIBIND 3X7  [employer] [id number] Left 1 Implanted    Materials: Prolene 3-0 and skin staples  Injectables: 1) Pre-operatively: 10 cc of 50:50 mixture 1%lidocaine  plain and 0.5% marcaine  plain 2) Post-operatively: None   Specimens: - Pathology: Left first ray, proximal margin inked - Microbiology: Bone culture 1st met head   Antibiotics: IV antibiotics given per schedule on the floor  Drains: None  Complications: Patient tolerated the procedure well without complication.   Operative findings: As below in detailed report  Indications for Procedure: Jeremiah Burns presents to [place], Jeremiah Burns, DPM with a chief complaint of chronic ulceration of the plantar aspect of the 1st MPJ left foot with concern for osteomyelitis of the hallux proximal pahalnx and 1st met head. Also with ulceration lateral forefoot. The patient has failed conservative treatments of various modalities. At this time the patient has elected to proceed with surgical correction. All alternatives, risks, and  complications of the procedures were thoroughly explained to the patient. Patient exhibits appropriate understanding of all discussion points and informed consent was signed and obtained in the chart with no guarantees to surgical outcome given or implied.  Description of Procedure: Patient was brought to the operating room. Patient remained on their hospital bed in the supine position. A surgical timeout was performed and all members of the operating room, the procedure, and the surgical site were identified. anesthesia occurred as per anesthesia record. Local anesthetic as previously described was then injected about the operative field in a local infiltrative block.  The operative lower extremity as noted above was then prepped and draped in the usual sterile manner. The following procedure then began.  Attention was directed to the ulceration at the lateral aspect of the forefoot. It measured 2 x 1.5 cm and had necrotic tissue overlying. The wound was completely inspected. The wound base had necrotic eschar overlying. There were no signs of infection, no sinus tracts. The wound was then prepped for graft coverage. The eschar was excisionally debrided with 10 blade to sub cutaneous fat tissue. The entire wound base thoroughly debrided with curettes and rongeurs to the level of healthy viable tissue. The wound was then irrigated with saline with cysto tubing. The wound measured 2 x 1.5 cm. Hemostasis was achieved with electrocautery. The acellular kerecis graft was then placed within the wound bed, covering the entire wound. This was then affixed with surgical staples.    Attention was directed to the LEFT lower extremity. A racquet type incision was made over the dorsal medial aspect of the 1st metatarsal, including the entire digit. A full thickness incision was made down to bone using  a #15 blade. The incision was planned so as to excise  the plantar 1st MPJ ulceration. The incision was continued through  the soft tissue down to the shaft of the 1st metatarsal plantar medially. No purulence was expressed. A deep wound culture was taken at this time and passed off the field. Using a #15 blade, the digit was then disarticulated in its entirety at the metatarsophalangeal joint and freed of all soft tissue attachments. The specimen was passed off the field and sent for gross pathology. Next, a key elevator was then used to free up the periosteum on the metatarsal shaft. Using a sagittal saw, the metatarsal was cut in a dorsal distal to plantar proximal orientation and beveled so that the medial cortex was shorter than the lateral. A bone culture was taken at this time from the metatarsal head with rongeur and sent for micro. The bone was passed off the field and sent with the hallux for pathology with the proximal margin inked.  All remaining non-viable and necrotic tissues were sharply resected and removed. Extensor and flexors tendons were grasped with a hemostat  and cut proximally. The surgical site was then flushed with 1000ml of saline with pulse lavage.  The skin flap was then approximated and partially closed  under minimal tension with 3-0 prolene and skin staples.   The surgical site was then dressed with xeroform 4x4 guaze abd pad kerlix and ace wrap. The patient tolerated both the procedure and anesthesia well with vital signs stable throughout. The patient was transferred in good condition and all vital signs stable  from the OR to recovery under the discretion of anesthesia.  Condition: Vital signs stable, neurovascular status unchanged from preoperative   Surgical plan:  Suspect clean margin obtained, may be best to wait for pathology report to confirm. Wound was excised and surgical site was closed primarily. Recommend 7 days PO abx. Cultures sent. Minimal weightbearing in post op shoe to heel for transfers.  The patient will be Heel WB in a post op shoe to the operative limb until further  instructed. The dressing is to remain clean, dry, and intact. Will continue to follow unless noted elsewhere.   Russ Course, DPM [facility]

## 2024-02-04 NOTE — Care Management Important Message (Signed)
 Important Message  Patient Details  Name: Mont Jagoda MRN: [id number] Date of Birth: 1956/01/10   Important Message Given:  Yes - Medicare IM     Wynonia Hedges 02/04/2024, 1:28 PM

## 2024-02-04 NOTE — Progress Notes (Addendum)
 Initial Nutrition Assessment  DOCUMENTATION CODES:   Not applicable - at risk for malnutrition 2/2 estimated intake inadequate compared to estimated needs, will re-assess criteria upon in-person assessment and completion of NFPE  INTERVENTION:  Encourage PO intake  Add Ensure Enlive po BID, each supplement provides 350 kcal and 20 grams of protein. Add Magic cup TID with meals, each supplement provides 290 kcal and 9 grams of protein  Snacks BID to augment intake Recommend drawing vitamins B12, C, folate, zinc , magnesium to assess for micronutrient deficiencies that may inhibit wound healing Continue MVI with minerals daily  New weight collection to assess trend s/p amputation   NUTRITION DIAGNOSIS:  Increased nutrient needs related to wound healing, post-op healing as evidenced by estimated needs.  GOAL:  Patient will meet greater than or equal to 90% of their needs  MONITOR:  PO intake, Supplement acceptance, Skin  REASON FOR ASSESSMENT:  Consult Wound healing  ASSESSMENT:   Pt with PMH significant for: uncontrolled T2DM, peripheral neuropathy, CKD III, PAD. Presented with c/o worsening L foot ulcer and found to have osteomyelitis of L foot and amputation scheduled.  4/2 MRI negative for osteomyelitis 4/3 underwent angioplasty of the left posterior tibial artery  4/4 I and D with sesamoidectomy.  Bone biopsy and dermal allograft by podiatry 4/11 discharge from the hospital on Zyvox  and Bactrim .  EOT 4/25 4/30 hospitalization for hematemesis.  AKI on CKD 3B 6/16 seen in the clinic for a follow-up.  Deep wound cultures were obtained.  Concern for osteomyelitis and therefore was sent to the hospital for evaluation and intervention.   6/18  partial first ray amputation, I&D, allograft left foot, podiatry Dr. Rosemarie Conquest   Patient most recently admitted in April for similar presentation. Negative for osteomyelitis at that time.   Average Meal Intake No documented meal intake  to review  Patient down for surgery at time of attempted visit this morning. Unable to interview patient, so chart review completed in lieu of this with plans to follow up during next in-person visit.   He refused Prosource and Juven during last admission, at which time he had resection of first metatarsal head of his left foot. Ensure Plus High Protein indicated over Glucerna due to protein content as he is unwilling to accept other high protein supplements that are lower in sugar.   He reported during last admission that he consumed an average of 2 meals per day, but did not share the composition of those meals. Does drink green tea throughout the day.   Admit/Current Weight: 81.6kg - ? Accuracy as it appears pulled forward from previous admission  During last admission, endorsed UBW as around 180lbs. Admission weight questionable, as it appears pulled forward from last hospitalization. Will request new weight s/p amputation.   Meds: SSI 0-9 q4, Semglee  12 QHS, pantoprazole , senna-docusate, IV ABX  Will recommend the following micronutrient labs be drawn to assess for deficiencies that may impact wound healing. These include: magnesium, B12, C, zinc , and folate.   Sodium low. A1c elevated, which is in line with his hx of uncontrolled diabetes. Diabetes coordinator engaged to aid in blood sugar management as it relates to wound healing.   Labs: Na+ 132>134>133 (L) K+ 4.0 (wdl) Sed rate 54 (H) Crt 1.69>1.29>1.37 (H) CBGs 155-158 x24 hours A1c 9.2 (01/2024)   NUTRITION - FOCUSED PHYSICAL EXAM: Unable to complete as patient in surgery, will defer to next in-person assessment  Diet Order:   Diet Order  Diet Carb Modified Fluid consistency: Thin  Diet effective now             EDUCATION NEEDS:   Not appropriate for education at this time  Skin:  Skin Assessment: Skin Integrity Issues: Skin Integrity Issues:: Incisions Incisions: surgical site of L foot  amputation  Last BM:     Height:  Ht Readings from Last 1 Encounters:  02/04/24 5' 11 (1.803 m)   Weight:  Wt Readings from Last 1 Encounters:  02/04/24 81.6 kg   Ideal Body Weight:     BMI:  Body mass index is 25.09 kg/m.  Estimated Nutritional Needs:   Kcal:  2000-2200 kcals  Protein:  110-130g  Fluid:  >2L/day  Con Decant MS, RD, LDN Registered Dietitian Clinical Nutrition RD Inpatient Contact Info in Amion

## 2024-02-04 NOTE — Progress Notes (Addendum)
 PROGRESS NOTE    Jeremiah Burns  ZOX:[id number] DOB: 1955-08-24 DOA: 02/02/2024 PCP: [place], Sarah T, MD    Brief Narrative:   Jeremiah Burns is a 68 y.o. male with past medical history significant for HTN, DM2, PAD, CKD stage IIIa, anxiety/depression who presented to [facility] ED on 02/02/2024 with worsening of his left foot ulcer.  Patient was seen in outpatient podiatry clinic for routine follow-up, concern for osteomyelitis and was sent to the hospital for further evaluation and management.  Significant events: 4/3: underwent angioplasty of the left posterior tibial artery  4/4: I&D with sesamoidectomy.  Bone biopsy and dermal allograft by podiatry 4/11: Discharge from the hospital on Zyvox  and Bactrim .  EOT 4/25 4/30: hospitalization for hematemesis.  AKI on CKD 3B 6/16: Admit with left foot infection, osteomyelitis, septic arthritis, myositis 6/18: Partial first ray amputation, I&D, allograft left foot, podiatry Dr. Rosemarie Conquest   Assessment & Plan:   Left foot diabetic infection with osteomyelitis Left first MTP septic arthritis Left foot cellulitis/myositis Patient presenting from podiatry outpatient office with concern for osteomyelitis with worsening of his left foot ulcer.  Left foot x-ray with plantar soft tissue defect, bony changes first metatarsal phalangeal joint consistent with septic arthritis and or osteomyelitis.  MRI left foot without contrast with soft tissue wound/ulceration inferior first MTP joint with gas adjacent soft tissues consistent with septic arthritis/osteomyelitis; likely reactive diffuse myositis, dorsal cellulitis.  Podiatry was consulted and patient underwent partial first ray amputation, I&D with excisional debridement lateral fifth metatarsal head and application of allograft by Dr. Rosemarie Conquest on 02/04/2024. -- Podiatry following, appreciate assistance -- Minimal weightbearing postop shoe to heel for transfers -- Blood cultures x 2: No growth x 2 days --  Operative culture with rare GPC's in pairs, further pending -- Zyvox  6 mg IV every 12 hours -- Unasyn 3 g IV every 8 hours -- Tylenol  650 mg p.o. every 6 hours as needed mild pain -- Oxycodone  5 mg p.o. every 4 hours as needed moderate pain   CKD stage IIIa Baseline creatinine 1.4, creatinine admission 1.69. -- Cr 1.69>1.29>1.37 -- Holding lisinopril -- Avoid nephrotoxins, renally dose all medications -- BMP in am  DM2, uncontrolled with hyperglycemia Hemoglobin A1c 9.2%, poorly controlled.  Home regimen includes Jardiance 25 mg p.o. daily, Semglee  25u [place]ard BID, Humalog sliding scale -- Diabetic educator following, appreciate assistance -- Semglee  12u  daily -- Sensitive SSI for coverage -- CBG's before every meal/at bedtime  PAD s/p angioplasty left posterior tibial artery/10/2023 Currently not on antiplatelet medication due to stopping aspirin  and Plavix  for GI bleed 4/30.  -- Would benefit from restarting aspirin  and Plavix  postoperatively -- Continue atorvastatin  40 mg p.o. daily  Hx ulcerative esophagitis/gastritis Underwent EGD on 12/16/2023 with findings of LA grade D ulcerative esophagitis with no active bleeding, nonbleeding gastritis. -- Protonix  40 mg p.o. BID  Microcytic anemia Hemoglobin 9.0, MCV 79.7. -- Check anemia panel in a.m. -- CBC in a.m.  Anxiety/depression -- Seroquel  50 mg p.o. daily   DVT prophylaxis: SCDs Start: 02/02/24 2345    Code Status: Full Code Family Communication: No family present at bedside  Disposition Plan:  Level of care: Med-Surg Status is: Inpatient Remains inpatient appropriate because: PT/OT evaluation, await operative cultures/pathology; suspect will need SNF placement    Consultants:  Podiatry, Dr. Rosemarie Conquest  Procedures:  Partial first ray amputation, I&D, allograft left foot, podiatry Dr. Rosemarie Conquest, 6/18  Antimicrobials:  Zyvox  Unasyn   Subjective: Patient seen examined bedside, lying in bed.  Sleeping  but  easily arousable.  Awaiting to proceed to the OR later this morning for planned amputation for underlying osteomyelitis left foot.  No questions or concerns at this time.  Denies headache, no chest pain, no shortness of breath, no abdominal pain, no fever/chills/night sweats, no nausea/vomiting/diarrhea.  No acute events overnight per nursing staff.  Objective: Vitals:   02/04/24 1215 02/04/24 1230 02/04/24 1245 02/04/24 1316  BP: 124/80 131/81 120/85 (!) 149/91  Pulse: 88 85 85 83  Resp: 11 (!) 9 10 18   Temp: 97.7 F (36.5 C)  97.6 F (36.4 C)   TempSrc:    Oral  SpO2: 94% 95% 94% 98%  Weight:      Height:        Intake/Output Summary (Last 24 hours) at 02/04/2024 1610 Last data filed at 02/04/2024 1155 Gross per 24 hour  Intake 500 ml  Output 905 ml  Net -405 ml   Filed Weights   02/02/24 2010 02/04/24 1015  Weight: 81.6 kg 81.6 kg    Examination:  Physical Exam: GEN: NAD, alert and oriented x 3, chronically ill appearance, appears older than stated age HEENT: NCAT, PERRL, EOMI, sclera clear, MMM PULM: CTAB w/o wheezes/crackles, normal respiratory effort, on room air CV: RRR w/o M/G/R GI: abd soft, NTND, + BS MSK: no peripheral edema, moves all extremities independently NEURO: No focal neurological deficit PSYCH: normal mood/affect, poor insight Integumentary: Left foot with multiple areas of ulceration as depicted below, otherwise no other concerning rashes/lesions/wounds noted on exposed skin surfaces          Data Reviewed: I have personally reviewed following labs and imaging studies  CBC: Recent Labs  Lab 02/02/24 1830 02/03/24 0527 02/04/24 0511  WBC 9.5 7.3 7.2  NEUTROABS 6.5  --   --   HGB 10.4* 9.4* 9.0*  HCT 33.0* 30.0* 29.4*  MCV 78.8* 77.7* 79.7*  PLT 419* 343 329   Basic Metabolic Panel: Recent Labs  Lab 02/02/24 1830 02/03/24 0527 02/04/24 0511  NA 132* 134* 133*  K 5.1 4.0 4.0  CL 101 104 103  CO2 20* 20* 22  GLUCOSE 294* 158*  155*  BUN 34* 29* 25*  CREATININE 1.69* 1.29* 1.37*  CALCIUM  9.8 8.9 8.7*   GFR: Estimated Creatinine Clearance: 55.7 mL/min (A) (by C-G formula based on SCr of 1.37 mg/dL (H)). Liver Function Tests: Recent Labs  Lab 02/02/24 1830  AST 18  ALT 19  ALKPHOS 58  BILITOT 0.4  PROT 7.7  ALBUMIN  3.8   No results for input(s): LIPASE, AMYLASE in the last 168 hours. No results for input(s): AMMONIA in the last 168 hours. Coagulation Profile: No results for input(s): INR, PROTIME in the last 168 hours. Cardiac Enzymes: No results for input(s): CKTOTAL, CKMB, CKMBINDEX, TROPONINI in the last 168 hours. BNP (last 3 results) No results for input(s): PROBNP in the last 8760 hours. HbA1C: Recent Labs    02/02/24 2328  HGBA1C 9.2*   CBG: Recent Labs  Lab 02/04/24 0349 02/04/24 0901 02/04/24 1107 02/04/24 1216 02/04/24 1318  GLUCAP 154* 125* 84 128* 108*   Lipid Profile: No results for input(s): CHOL, HDL, LDLCALC, TRIG, CHOLHDL, LDLDIRECT in the last 72 hours. Thyroid Function Tests: No results for input(s): TSH, T4TOTAL, FREET4, T3FREE, THYROIDAB in the last 72 hours. Anemia Panel: No results for input(s): VITAMINB12, FOLATE, FERRITIN, TIBC, IRON, RETICCTPCT in the last 72 hours. Sepsis Labs: Recent Labs  Lab 02/02/24 1845 02/02/24 2328  LATICACIDVEN 2.2* 1.6  Recent Results (from the past 240 hours)  Blood Cultures x 2 sites     Status: None (Preliminary result)   Collection Time: 02/02/24  6:30 PM   Specimen: BLOOD RIGHT FOREARM  Result Value Ref Range Status   Specimen Description BLOOD RIGHT FOREARM  Final   Special Requests   Final    BOTTLES DRAWN AEROBIC AND ANAEROBIC Blood Culture adequate volume   Culture   Final    NO GROWTH 2 DAYS Performed at [facility], [address], [place], [place] [address]    Report Status PENDING  Incomplete  Blood Cultures x 2 sites     Status: None (Preliminary  result)   Collection Time: 02/02/24  7:37 PM   Specimen: BLOOD  Result Value Ref Range Status   Specimen Description BLOOD LEFT ANTECUBITAL  Final   Special Requests   Final    BOTTLES DRAWN AEROBIC AND ANAEROBIC Blood Culture adequate volume   Culture   Final    NO GROWTH 2 DAYS Performed at [facility], [address], [place], [place] [address]    Report Status PENDING  Incomplete  MRSA Next Gen by PCR, Nasal     Status: None   Collection Time: 02/04/24  6:11 AM   Specimen: Nasal Mucosa; Nasal Swab  Result Value Ref Range Status   MRSA by PCR Next Gen NOT DETECTED NOT DETECTED Final    Comment: (NOTE) The GeneXpert MRSA Assay (FDA approved for NASAL specimens only), is one component of a comprehensive MRSA colonization surveillance program. It is not intended to diagnose MRSA infection nor to guide or monitor treatment for MRSA infections. Test performance is not FDA approved in patients less than 33 years old. Performed at [facility], [address], [place], [place] [address]   Aerobic/Anaerobic Culture w Gram Stain (surgical/deep wound)     Status: None (Preliminary result)   Collection Time: 02/04/24 11:35 AM   Specimen: Bone; Tissue  Result Value Ref Range Status   Specimen Description BONE  Final   Special Requests NONE  Final   Gram Stain   Final    NO WBC SEEN RARE GRAM POSITIVE COCCI IN PAIRS Performed at [facility], [address] [place], [place] [address]    Culture PENDING  Incomplete   Report Status PENDING  Incomplete         Radiology Studies: DG Foot 2 Views Left Result Date: 02/04/2024 CLINICAL DATA:  Postoperative state. EXAM: LEFT FOOT - 2 VIEW COMPARISON:  Left foot radiograph 02/02/2024, 11/21/2023 FINDINGS: Interval amputation of the first digit to the proximal metatarsal shaft. Expected postoperative more distal subcutaneous air and plantar medial forefoot surgical skin staples. Additional surgical skin staples lateral  to the fifth metatarsophalangeal joint. Mild interphalangeal joint space narrowing and peripheral osteophytosis. No acute fracture or dislocation. Mild atherosclerotic calcifications. IMPRESSION: Interval amputation of the first digit to the proximal metatarsal shaft with expected appearance. Electronically Signed   By: Bertina Broccoli M.D.   On: 02/04/2024 15:51   MR FOOT LEFT WO CONTRAST Result Date: 02/02/2024 EXAM DESCRIPTION: MR FOOT LEFT WO CONTRAST CLINICAL HISTORY: Eval osteomyelitis 1st ray COMPARISON: None Available. TECHNIQUE: MRI of the foot is performed according to our usual protocol with multiplanar multi sequence imaging. FINDINGS: There is a soft tissue wound/ulceration inferior to the first MTP joint. Moderate phlegmonous changes. Moderate area of susceptibility artifact within the soft tissues probably reflecting gas related to the ulcer. There  is moderate to severe marrow edema as well as cortical destruction to the first metatarsal head and the proximal phalanx. Moderate joint effusion. Findings consistent with septic arthritis/osteomyelitis. The marrow signal is otherwise unremarkable. Moderate likely reactive diffuse myositis. Mild to moderate dorsal subcutaneous edema suggesting cellulitis. The tendons are unremarkable. IMPRESSION: Soft tissue wound/ulceration inferior to the first MTP joint. Probable areas of gas with susceptibility artifact in the adjacent soft tissues. Findings consistent with septic arthritis/osteomyelitis to the first MTP joint as above. Likely reactive diffuse myositis. Suggestion of dorsal cellulitis. Electronically signed by: Basilio Both MD 02/02/2024 10:45 PM EDT RP Workstation: [id number]   DG Foot Complete Left Result Date: 02/02/2024 CLINICAL DATA:  Foot wound. EXAM: LEFT FOOT - COMPLETE 3 VIEW COMPARISON:  02/02/2024, 4:15 p.m. FINDINGS: Soft tissue defect posterior to the metatarsals. Bony sulfur process first metatarsophalangeal joint could represent  sequela of septic arthritis and/or osteomyelitis. Postop change in the proximal first proximal phalanx. No acute traumatic osseous abnormalities. IMPRESSION: Plantar soft tissue defect. Bony changes in the first metatarsophalangeal joint could represent sequela of septic arthritis and/or osteomyelitis. Electronically Signed   By: Sydell Eva M.D.   On: 02/02/2024 18:51   DG Foot Complete Left Result Date: 02/02/2024 Please see detailed radiograph report in office note.       Scheduled Meds:  atorvastatin   40 mg Oral Daily   insulin  aspart  0-9 Units Subcutaneous Q4H   insulin  glargine-yfgn  12 Units Subcutaneous QHS   pantoprazole   40 mg Oral BID WC   QUEtiapine   50 mg Oral Daily   senna-docusate  1 tablet Oral BID   Continuous Infusions:  ampicillin-sulbactam (UNASYN) IV 3 g (02/04/24 1458)   linezolid  (ZYVOX ) IV 600 mg (02/03/24 2323)     LOS: 2 days    Time spent: 48 minutes spent on 02/04/2024 caring for this patient face-to-face including chart review, ordering labs/tests, documenting, discussion with nursing staff, consultants, updating family and interview/physical exam    Rema Care [place], DO [facility] Available via Epic secure chat 7am-7pm After these hours, please refer to coverage provider listed on [email or web address] 02/04/2024, 4:10 PM

## 2024-02-04 NOTE — Transfer of Care (Signed)
 Immediate Anesthesia Transfer of Care Note  Patient: Jeremiah Burns  Procedure(s) Performed: AMPUTATION, FOOT, RAY (Left)  Patient Location: PACU  Anesthesia Type:MAC  Level of Consciousness: awake, alert , and oriented  Airway & Oxygen Therapy: Patient Spontanous Breathing and Patient connected to nasal cannula oxygen  Post-op Assessment: Report given to RN and Post -op Vital signs reviewed and stable  Post vital signs: Reviewed and stable  Last Vitals:  Vitals Value Taken Time  BP 124/80 02/04/24 12:15  Temp 97   Pulse 89 02/04/24 12:18  Resp 17 02/04/24 12:18  SpO2 93 % 02/04/24 12:18  Vitals shown include unfiled device data.  Last Pain:  Vitals:   02/04/24 1015  TempSrc: Oral  PainSc: 8          Complications: No notable events documented.

## 2024-02-04 NOTE — H&P (Signed)
 Anesthesia H&P Update: History and Physical Exam reviewed; patient is OK for planned anesthetic and procedure. ? ?

## 2024-02-04 NOTE — Anesthesia Preprocedure Evaluation (Signed)
 Anesthesia Evaluation  Patient identified by MRN, date of birth, ID band Patient awake    Reviewed: Allergy & Precautions, H&P , NPO status , Patient's Chart, lab work & pertinent test results  History of Anesthesia Complications Negative for: history of anesthetic complications  Airway Mallampati: II  TM Distance: >3 FB Neck ROM: Full    Dental  (+) Dental Advisory Given   Pulmonary neg pulmonary ROS   Pulmonary exam normal        Cardiovascular hypertension, + Peripheral Vascular Disease  Normal cardiovascular exam     Neuro/Psych   Anxiety Depression    negative neurological ROS  negative psych ROS   GI/Hepatic negative GI ROS, Neg liver ROS,,,  Endo/Other  negative endocrine ROSdiabetes, Type 2, Oral Hypoglycemic Agents, Insulin  Dependent    Renal/GU CRF and Renal InsufficiencyRenal disease  negative genitourinary   Musculoskeletal negative musculoskeletal ROS (+)    Abdominal   Peds negative pediatric ROS (+)  Hematology negative hematology ROS (+)  Plt 129k    Anesthesia Other Findings    Reproductive/Obstetrics negative OB ROS                             Anesthesia Physical Anesthesia Plan  ASA: 3  Anesthesia Plan: MAC   Post-op Pain Management: Minimal or no pain anticipated   Induction: Intravenous  PONV Risk Score and Plan: 1 and Propofol  infusion and Treatment may vary due to age or medical condition  Airway Management Planned: Natural Airway and Simple Face Mask  Additional Equipment: None  Intra-op Plan:   Post-operative Plan:   Informed Consent: I have reviewed the patients History and Physical, chart, labs and discussed the procedure including the risks, benefits and alternatives for the proposed anesthesia with the patient or authorized representative who has indicated his/her understanding and acceptance.       Plan Discussed with: CRNA and  Anesthesiologist  Anesthesia Plan Comments:        Anesthesia Quick Evaluation

## 2024-02-04 NOTE — Progress Notes (Signed)
 History and Physical Interval Note:  02/04/2024 10:35 AM  Jeremiah Burns  has presented today for surgery, with the diagnosis of osteomyelitis of the first ray as well as wound on the lateral 5th met head.  The various methods of treatment have been discussed with the patient and family. After consideration of risks, benefits and other options for treatment, the patient has consented to   Procedure(s) with comments: AMPUTATION, FOOT, RAY (Left) - Left partial first ray amputation and wound debridement as a surgical intervention.  The patient's history has been reviewed, patient examined, no change in status, stable for surgery.  I have reviewed the patient's chart and labs.  Questions were answered to the patient's satisfaction.     Karlene Overcast Tajae Rybicki

## 2024-02-04 NOTE — Anesthesia Postprocedure Evaluation (Signed)
 Anesthesia Post Note  Patient: Jeremiah Burns  Procedure(s) Performed: AMPUTATION, FOOT, RAY (Left)     Patient location during evaluation: PACU Anesthesia Type: MAC Level of consciousness: awake and alert Pain management: pain level controlled Vital Signs Assessment: post-procedure vital signs reviewed and stable Respiratory status: spontaneous breathing, nonlabored ventilation and respiratory function stable Cardiovascular status: blood pressure returned to baseline and stable Postop Assessment: no apparent nausea or vomiting Anesthetic complications: no   There were no known notable events for this encounter.  Last Vitals:  Vitals:   02/04/24 1245 02/04/24 1316  BP: 120/85 (!) 149/91  Pulse: 85 83  Resp: 10 18  Temp: 36.4 C   SpO2: 94% 98%    Last Pain:  Vitals:   02/04/24 1316  TempSrc: Oral  PainSc: 0-No pain                 Earvin Goldberg

## 2024-02-04 NOTE — Progress Notes (Signed)
 Orthopedic Tech Progress Note Patient Details:  Jeremiah Burns [birth year] [id number]  Ortho Devices Type of Ortho Device: Postop shoe/boot Ortho Device/Splint Location: LLE Ortho Device/Splint Interventions: Ordered, Application, Adjustment, Removal   Post Interventions Patient Tolerated: Well Instructions Provided: Care of device  Jeremiah Burns 02/04/2024, 2:22 PM

## 2024-02-04 NOTE — TOC Progression Note (Signed)
 Transition of Care [facility]) - Progression Note    Patient Details  Name: Jeremiah Burns MRN: [id number] Date of Birth: November 29, 1955  [facility] of [facility] CM/SW Contact  Tom-Johnson, Angelique Ken, RN Phone Number: 02/04/2024, 2:21 PM  Clinical Narrative:     Patient underwent Partial First Ray Amputation Lt Foot, I&D of Ulceration Lateral 5th Metatarsal Head with prep for Graft to Lt Foot and Application of Dermal Allograft 2 x 1.5 cm with Lt Foot by Podiatry today 02/04/24. Continues IV abx.   Patient not Medically ready for discharge.  CM will continue to follow as patient progresses with care towards discharge.       Expected Discharge Plan and Services                                               Social Determinants of Health (SDOH) Interventions SDOH Screenings   Food Insecurity: No Food Insecurity (02/03/2024)  Housing: Low Risk  (02/03/2024)  Transportation Needs: No Transportation Needs (02/04/2024)  Utilities: Not At Risk (02/04/2024)  Recent Concern: Utilities - At Risk (11/19/2023)  Financial Resource Strain: Not at Risk (08/07/2023)   Received [facility]  Physical Activity: At Risk (08/07/2023)   Received from [facility]  Social Connections: Moderately Isolated (02/03/2024)  Stress: Not at Risk (08/07/2023)   Received from [facility]  Tobacco Use: Low Risk  (02/02/2024)    Readmission Risk Interventions    02/03/2024    3:35 PM  Readmission Risk Prevention Plan  Transportation Screening Complete  PCP or Specialist Appt within 5-7 Days Complete  Home Care Screening Complete  Medication Review (RN CM) Referral to Pharmacy

## 2024-02-05 ENCOUNTER — Encounter (HOSPITAL_COMMUNITY): Payer: Self-pay | Admitting: Podiatry

## 2024-02-05 DIAGNOSIS — L089 Local infection of the skin and subcutaneous tissue, unspecified: Secondary | ICD-10-CM | POA: Diagnosis not present

## 2024-02-05 DIAGNOSIS — E11628 Type 2 diabetes mellitus with other skin complications: Secondary | ICD-10-CM | POA: Diagnosis not present

## 2024-02-05 LAB — BASIC METABOLIC PANEL WITH GFR
Anion gap: 8 (ref 5–15)
BUN: 22 mg/dL (ref 8–23)
CO2: 23 mmol/L (ref 22–32)
Calcium: 8.6 mg/dL — ABNORMAL LOW (ref 8.9–10.3)
Chloride: 100 mmol/L (ref 98–111)
Creatinine, Ser: 1.48 mg/dL — ABNORMAL HIGH (ref 0.61–1.24)
GFR, Estimated: 52 mL/min — ABNORMAL LOW (ref >60)
Glucose, Bld: 233 mg/dL — ABNORMAL HIGH (ref 70–99)
Potassium: 4.1 mmol/L (ref 3.5–5.1)
Sodium: 131 mmol/L — ABNORMAL LOW (ref 135–145)

## 2024-02-05 LAB — CBC
HCT: 28.9 % — ABNORMAL LOW (ref 39.0–52.0)
Hemoglobin: 9 g/dL — ABNORMAL LOW (ref 13.0–17.0)
MCH: 24.5 pg — ABNORMAL LOW (ref 26.0–34.0)
MCHC: 31.1 g/dL (ref 30.0–36.0)
MCV: 78.7 fL — ABNORMAL LOW (ref 80.0–100.0)
Platelets: 335 10*3/uL (ref 150–400)
RBC: 3.67 MIL/uL — ABNORMAL LOW (ref 4.22–5.81)
RDW: 14.5 % (ref 11.5–15.5)
WBC: 9.1 10*3/uL (ref 4.0–10.5)
nRBC: 0 % (ref 0.0–0.2)

## 2024-02-05 LAB — RETICULOCYTES
Immature Retic Fract: 18.2 % — ABNORMAL HIGH (ref 2.3–15.9)
RBC.: 3.66 MIL/uL — ABNORMAL LOW (ref 4.22–5.81)
Retic Count, Absolute: 53.5 10*3/uL (ref 19.0–186.0)
Retic Ct Pct: 1.5 % (ref 0.4–3.1)

## 2024-02-05 LAB — GLUCOSE, CAPILLARY
Glucose-Capillary: 163 mg/dL — ABNORMAL HIGH (ref 70–99)
Glucose-Capillary: 167 mg/dL — ABNORMAL HIGH (ref 70–99)
Glucose-Capillary: 175 mg/dL — ABNORMAL HIGH (ref 70–99)
Glucose-Capillary: 189 mg/dL — ABNORMAL HIGH (ref 70–99)
Glucose-Capillary: 240 mg/dL — ABNORMAL HIGH (ref 70–99)
Glucose-Capillary: 242 mg/dL — ABNORMAL HIGH (ref 70–99)

## 2024-02-05 LAB — IRON AND TIBC
Iron: 24 ug/dL — ABNORMAL LOW (ref 45–182)
Saturation Ratios: 6 % — ABNORMAL LOW (ref 17.9–39.5)
TIBC: 416 ug/dL (ref 250–450)
UIBC: 392 ug/dL

## 2024-02-05 LAB — FOLATE: Folate: 7.5 ng/mL (ref >5.9)

## 2024-02-05 LAB — VITAMIN B12: Vitamin B-12: 370 pg/mL (ref 180–914)

## 2024-02-05 LAB — MAGNESIUM: Magnesium: 2 mg/dL (ref 1.7–2.4)

## 2024-02-05 LAB — FERRITIN: Ferritin: 5 ng/mL — ABNORMAL LOW (ref 24–336)

## 2024-02-05 MED ORDER — AMOXICILLIN-POT CLAVULANATE 875-125 MG PO TABS
1.0000 | ORAL_TABLET | Freq: Two times a day (BID) | ORAL | Status: DC
  Administered 2024-02-05 (×2): 1 via ORAL
  Filled 2024-02-05 (×2): qty 1

## 2024-02-05 MED ORDER — INSULIN GLARGINE-YFGN 100 UNIT/ML ~~LOC~~ SOLN
16.0000 [IU] | Freq: Every day | SUBCUTANEOUS | Status: DC
  Administered 2024-02-05: 16 [IU] via SUBCUTANEOUS
  Filled 2024-02-05 (×2): qty 0.16

## 2024-02-05 MED ORDER — DOXYCYCLINE HYCLATE 100 MG PO TABS
100.0000 mg | ORAL_TABLET | Freq: Two times a day (BID) | ORAL | Status: DC
  Administered 2024-02-05 – 2024-02-07 (×5): 100 mg via ORAL
  Filled 2024-02-05 (×5): qty 1

## 2024-02-05 NOTE — Progress Notes (Signed)
 Patient has excellent vasculature - please assess and attempt prior to placing IVT consult.

## 2024-02-05 NOTE — Progress Notes (Signed)
 S/p ray amputation with likely clean margins per podiatry. Rec 7d of PO abx. D/w Dr [place] and we will transition to Augmentin/doxy x7d.   Ivery Marking, PharmD, BCIDP, AAHIVP, CPP Infectious Disease Pharmacist 02/05/2024 9:11 AM

## 2024-02-05 NOTE — Plan of Care (Signed)

## 2024-02-05 NOTE — Progress Notes (Signed)
 PROGRESS NOTE    Jeremiah Burns  ZOX:[id number] DOB: 06-Dec-1955 DOA: 02/02/2024 PCP: [place], Sarah T, MD    Brief Narrative:   Jeremiah Burns is a 68 y.o. male with past medical history significant for HTN, DM2, PAD, CKD stage IIIa, anxiety/depression who presented to [facility] ED on 02/02/2024 with worsening of his left foot ulcer.  Patient was seen in outpatient podiatry clinic for routine follow-up, concern for osteomyelitis and was sent to the hospital for further evaluation and management.  Significant events: 4/3: underwent angioplasty of the left posterior tibial artery  4/4: I&D with sesamoidectomy.  Bone biopsy and dermal allograft by podiatry 4/11: Discharge from the hospital on Zyvox  and Bactrim .  EOT 4/25 4/30: hospitalization for hematemesis.  AKI on CKD 3B 6/16: Admit with left foot infection, osteomyelitis, septic arthritis, myositis 6/18: Partial first ray amputation, I&D, allograft left foot, podiatry Dr. Rosemarie Conquest   Assessment & Plan:   Left foot diabetic infection with osteomyelitis Left first MTP septic arthritis Left foot cellulitis/myositis Patient presenting from podiatry outpatient office with concern for osteomyelitis with worsening of his left foot ulcer.  Left foot x-ray with plantar soft tissue defect, bony changes first metatarsal phalangeal joint consistent with septic arthritis and or osteomyelitis.  MRI left foot without contrast with soft tissue wound/ulceration inferior first MTP joint with gas adjacent soft tissues consistent with septic arthritis/osteomyelitis; likely reactive diffuse myositis, dorsal cellulitis.  Podiatry was consulted and patient underwent partial first ray amputation, I&D with excisional debridement lateral fifth metatarsal head and application of allograft by Dr. Rosemarie Conquest on 02/04/2024. -- Podiatry following, appreciate assistance -- Minimal weightbearing postop shoe to heel for transfers -- Blood cultures x 2: No growth x 3 days --  Operative culture with rare GPC's in pairs, further pending -- Doxycycline 100 mg p.o. twice daily x 7 days -- Augmentin 875-125 mg p.o. twice daily x 7 days -- Tylenol  650 mg p.o. every 6 hours as needed mild pain -- Oxycodone  5 mg p.o. every 4 hours as needed moderate pain  -- Dilaudid 0.5 mg IV every 4 hours as needed severe pain -- PT/OT evaluation: Pending -- 2C following for anticipated need of SNF placement  CKD stage IIIa Baseline creatinine 1.4, creatinine admission 1.69. -- Cr 1.69>1.29>1.37>1.48 -- Holding lisinopril -- Avoid nephrotoxins, renally dose all medications -- BMP in am  DM2, uncontrolled with hyperglycemia Hemoglobin A1c 9.2%, poorly controlled.  Home regimen includes Jardiance 25 mg p.o. daily, Semglee  25u [place] BID, Humalog sliding scale -- Diabetic educator following, appreciate assistance -- Semglee  16u [place] daily -- Sensitive SSI for coverage -- CBG's before every meal/at bedtime  PAD s/p angioplasty left posterior tibial artery/10/2023 Currently not on antiplatelet medication due to stopping aspirin  and Plavix  for GI bleed 4/30.  -- Would benefit from restarting aspirin  and Plavix  postoperatively -- Continue atorvastatin  40 mg p.o. daily  Hx ulcerative esophagitis/gastritis Underwent EGD on 12/16/2023 with findings of LA grade D ulcerative esophagitis with no active bleeding, nonbleeding gastritis. -- Protonix  40 mg p.o. BID  Microcytic anemia Hemoglobin 9.0, MCV 79.7.  Anemia panel with iron 24, TIBC 416, ferritin 5, folate 7.5, vitamin B12 370. -- Start ferrous sulfate 325 g p.o. daily -- Would benefit from outpatient follow-up with gastroenterology for screening colonoscopy  Anxiety/depression -- Seroquel  50 mg p.o. daily   DVT prophylaxis: SCDs Start: 02/02/24 2345    Code Status: Full Code Family Communication: No family present at bedside  Disposition Plan:  Level of care: Med-Surg Status is: Inpatient Remains  inpatient appropriate because:  PT/OT evaluation, await operative cultures/pathology; suspect will need SNF placement    Consultants:  Podiatry, Dr. Rosemarie Conquest  Procedures:  Partial first ray amputation, I&D, allograft left foot, podiatry Dr. Rosemarie Conquest, 6/18  Antimicrobials:  Zyvox  6/16 - 6/18 Unasyn 4/16 - 6/18 Doxycycline 6/19>> Augmentin 6/19>>   Subjective: Patient seen examined bedside, lying in bed.  RN present at bedside.  Patient stating do not feel well, complaining of pain to his foot.  Requesting pain medication and something for nausea.  Appetite poor.  Awaiting PT/OT evaluation.  No questions or concerns at this time.  Denies headache, no chest pain, no shortness of breath, no abdominal pain, no fever/chills/night sweats, no nausea/vomiting/diarrhea.  No acute events overnight per nursing staff.  Objective: Vitals:   02/04/24 1316 02/04/24 1659 02/05/24 0512 02/05/24 0759  BP: (!) 149/91 136/83 115/64 (!) 140/79  Pulse: 83 98 (!) 106 (!) 109  Resp: 18 18 18 18   Temp:  (!) 97.5 F (36.4 C) 98.2 F (36.8 C) 98.5 F (36.9 C)  TempSrc: Oral Oral  Oral  SpO2: 98% 98% 97% 98%  Weight:  91.4 kg    Height:        Intake/Output Summary (Last 24 hours) at 02/05/2024 1109 Last data filed at 02/05/2024 0849 Gross per 24 hour  Intake 1660 ml  Output 205 ml  Net 1455 ml   Filed Weights   02/02/24 2010 02/04/24 1015 02/04/24 1659  Weight: 81.6 kg 81.6 kg 91.4 kg    Examination:  Physical Exam: GEN: NAD, alert and oriented x 3, chronically ill appearance, appears older than stated age HEENT: NCAT, PERRL, EOMI, sclera clear, MMM PULM: CTAB w/o wheezes/crackles, normal respiratory effort, on room air CV: RRR w/o M/G/R GI: abd soft, NTND, + BS MSK: no peripheral edema, moves all extremities independently NEURO: No focal neurological deficit PSYCH: normal mood/affect, poor insight Integumentary: Left foot with surgical dressing/Ace wrap in place, clean/dry/intact, otherwise no other concerning  rashes/lesions/wounds noted on exposed skin surfaces     Data Reviewed: I have personally reviewed following labs and imaging studies  CBC: Recent Labs  Lab 02/02/24 1830 02/03/24 0527 02/04/24 0511 02/05/24 0527  WBC 9.5 7.3 7.2 9.1  NEUTROABS 6.5  --   --   --   HGB 10.4* 9.4* 9.0* 9.0*  HCT 33.0* 30.0* 29.4* 28.9*  MCV 78.8* 77.7* 79.7* 78.7*  PLT 419* 343 329 335   Basic Metabolic Panel: Recent Labs  Lab 02/02/24 1830 02/03/24 0527 02/04/24 0511 02/05/24 0527  NA 132* 134* 133* 131*  K 5.1 4.0 4.0 4.1  CL 101 104 103 100  CO2 20* 20* 22 23  GLUCOSE 294* 158* 155* 233*  BUN 34* 29* 25* 22  CREATININE 1.69* 1.29* 1.37* 1.48*  CALCIUM  9.8 8.9 8.7* 8.6*  MG  --   --   --  2.0   GFR: Estimated Creatinine Clearance: 56 mL/min (A) (by C-G formula based on SCr of 1.48 mg/dL (H)). Liver Function Tests: Recent Labs  Lab 02/02/24 1830  AST 18  ALT 19  ALKPHOS 58  BILITOT 0.4  PROT 7.7  ALBUMIN  3.8   No results for input(s): LIPASE, AMYLASE in the last 168 hours. No results for input(s): AMMONIA in the last 168 hours. Coagulation Profile: No results for input(s): INR, PROTIME in the last 168 hours. Cardiac Enzymes: No results for input(s): CKTOTAL, CKMB, CKMBINDEX, TROPONINI in the last 168 hours. BNP (last 3 results) No results for input(s): PROBNP  in the last 8760 hours. HbA1C: Recent Labs    02/02/24 2328  HGBA1C 9.2*   CBG: Recent Labs  Lab 02/04/24 1659 02/04/24 1938 02/05/24 0001 02/05/24 0513 02/05/24 0757  GLUCAP 144* 156* 242* 240* 167*   Lipid Profile: No results for input(s): CHOL, HDL, LDLCALC, TRIG, CHOLHDL, LDLDIRECT in the last 72 hours. Thyroid Function Tests: No results for input(s): TSH, T4TOTAL, FREET4, T3FREE, THYROIDAB in the last 72 hours. Anemia Panel: Recent Labs    02/05/24 0527  VITAMINB12 370  FOLATE 7.5  FERRITIN 5*  TIBC 416  IRON 24*  RETICCTPCT 1.5   Sepsis  Labs: Recent Labs  Lab 02/02/24 1845 02/02/24 2328  LATICACIDVEN 2.2* 1.6    Recent Results (from the past 240 hours)  Blood Cultures x 2 sites     Status: None (Preliminary result)   Collection Time: 02/02/24  6:30 PM   Specimen: BLOOD RIGHT FOREARM  Result Value Ref Range Status   Specimen Description BLOOD RIGHT FOREARM  Final   Special Requests   Final    BOTTLES DRAWN AEROBIC AND ANAEROBIC Blood Culture adequate volume   Culture   Final    NO GROWTH 3 DAYS Performed at [facility], [address] [place], [place] [address]    Report Status PENDING  Incomplete  Blood Cultures x 2 sites     Status: None (Preliminary result)   Collection Time: 02/02/24  7:37 PM   Specimen: BLOOD  Result Value Ref Range Status   Specimen Description BLOOD LEFT ANTECUBITAL  Final   Special Requests   Final    BOTTLES DRAWN AEROBIC AND ANAEROBIC Blood Culture adequate volume   Culture   Final    NO GROWTH 3 DAYS Performed at [facility], [address], [place], [place] [address]    Report Status PENDING  Incomplete  MRSA Next Gen by PCR, Nasal     Status: None   Collection Time: 02/04/24  6:11 AM   Specimen: Nasal Mucosa; Nasal Swab  Result Value Ref Range Status   MRSA by PCR Next Gen NOT DETECTED NOT DETECTED Final    Comment: (NOTE) The GeneXpert MRSA Assay (FDA approved for NASAL specimens only), is one component of a comprehensive MRSA colonization surveillance program. It is not intended to diagnose MRSA infection nor to guide or monitor treatment for MRSA infections. Test performance is not FDA approved in patients less than 69 years old. Performed at [facility], [address] [place], [place] [address]   Aerobic/Anaerobic Culture w Gram Stain (surgical/deep wound)     Status: None (Preliminary result)   Collection Time: 02/04/24 11:35 AM   Specimen: Bone; Tissue  Result Value Ref Range Status   Specimen Description BONE  Final   Special Requests  NONE  Final   Gram Stain   Final    NO WBC SEEN RARE GRAM POSITIVE COCCI IN PAIRS Performed at [facility], [address] [place], [place] [address]    Culture PENDING  Incomplete   Report Status PENDING  Incomplete         Radiology Studies: DG Foot 2 Views Left Result Date: 02/04/2024 CLINICAL DATA:  Postoperative state. EXAM: LEFT FOOT - 2 VIEW COMPARISON:  Left foot radiograph 02/02/2024, 11/21/2023 FINDINGS: Interval amputation of the first digit to the proximal metatarsal shaft. Expected postoperative more distal subcutaneous air and plantar medial forefoot surgical skin staples. Additional surgical skin staples lateral to the fifth metatarsophalangeal  joint. Mild interphalangeal joint space narrowing and peripheral osteophytosis. No acute fracture or dislocation. Mild atherosclerotic calcifications. IMPRESSION: Interval amputation of the first digit to the proximal metatarsal shaft with expected appearance. Electronically Signed   By: Bertina Broccoli M.D.   On: 02/04/2024 15:51        Scheduled Meds:  amoxicillin-clavulanate  1 tablet Oral Q12H   atorvastatin   40 mg Oral Daily   doxycycline  100 mg Oral Q12H   feeding supplement  237 mL Oral BID BM   insulin  aspart  0-9 Units Subcutaneous Q4H   insulin  glargine-yfgn  12 Units Subcutaneous QHS   multivitamin with minerals  1 tablet Oral Daily   pantoprazole   40 mg Oral BID WC   QUEtiapine   50 mg Oral Daily   senna-docusate  1 tablet Oral BID   Continuous Infusions:     LOS: 3 days    Time spent: 48 minutes spent on 02/05/2024 caring for this patient face-to-face including chart review, ordering labs/tests, documenting, discussion with nursing staff, consultants, updating family and interview/physical exam    Rema Care [place], DO [facility] Available via Epic secure chat 7am-7pm After these hours, please refer to coverage provider listed on [email or web address] 02/05/2024, 11:09 AM

## 2024-02-05 NOTE — Progress Notes (Signed)
  Subjective:  Patient ID: Jeremiah Burns, male    DOB: 03/25/1956,  MRN: [id number]  Chief Complaint  Patient presents with   Foot Problem    DOS: 02/04/24 Procedure: 1. Partial first ray amputation left foot 2. Irrigation and excisional debridement of ulceration lateral 5th metatarsal head with prep for graft, left foot 3. Application dermal allograft 2 x 1.5 cm, left foot  68 y.o. male seen for post op check. He is eating lunch when I saw him, says some pain in the left foot. Discussed findings from surgery and plan for dressing change tomorrow.   Review of Systems: Negative except as noted in the HPI. Denies N/V/F/Ch.   Objective:   Constitutional Well developed. Well nourished.  Vascular Foot warm and well perfused. Capillary refill normal to all digits.   No calf pain with palpation  Neurologic Normal speech. Oriented to person, place, and time. Epicritic sensation diminished to left foot  Dermatologic Dressing C/D/I to left foot  Orthopedic: S/p L partial first ray amputation    Radiographs: Interval amputation of the first digit to the proximal metatarsal shaft with expected appearance.  Pathology: Pending  Micro: RARE STAPHYLOCOCCUS AUREUS  RARE GRAM NEGATIVE RODS   Assessment:   1. Diabetic foot infection (HCC)   2. Osteomyelitis of toe of left foot (HCC)   S/p Left foot wound debridement with graft application and left partial first ray amputation  Plan:  Patient was evaluated and treated and all questions answered.  POD # 1 s/p Left partial first ray amputation and wound debridement with kerecis graft app -Progressing as expected post op. Follow up pathology to establish if clean margin and follow cultures. Length and route of abx pending path result.  -XR: Expected post op changes -WB Status: WB to heel in post op shoe for transfers -Sutures: remain intact 2-3 weeks. -Medications/ABX: continue IV abx pending further culture data -Dressing: Will plan  to change dsg tomorrow - F/u Plan: Will see pt tomorrow then likely follow up next week        Maridee Shoemaker, DPM [facility] / [facility]

## 2024-02-06 DIAGNOSIS — L089 Local infection of the skin and subcutaneous tissue, unspecified: Secondary | ICD-10-CM | POA: Diagnosis not present

## 2024-02-06 DIAGNOSIS — E11628 Type 2 diabetes mellitus with other skin complications: Secondary | ICD-10-CM | POA: Diagnosis not present

## 2024-02-06 LAB — GLUCOSE, CAPILLARY
Glucose-Capillary: 182 mg/dL — ABNORMAL HIGH (ref 70–99)
Glucose-Capillary: 145 mg/dL — ABNORMAL HIGH (ref 70–99)
Glucose-Capillary: 155 mg/dL — ABNORMAL HIGH (ref 70–99)
Glucose-Capillary: 173 mg/dL — ABNORMAL HIGH (ref 70–99)
Glucose-Capillary: 186 mg/dL — ABNORMAL HIGH (ref 70–99)

## 2024-02-06 LAB — SURGICAL PATHOLOGY

## 2024-02-06 LAB — AEROBIC/ANAEROBIC CULTURE W GRAM STAIN (SURGICAL/DEEP WOUND)

## 2024-02-06 MED ORDER — CIPROFLOXACIN HCL 500 MG PO TABS
500.0000 mg | ORAL_TABLET | Freq: Two times a day (BID) | ORAL | Status: DC
  Administered 2024-02-06 – 2024-02-09 (×7): 500 mg via ORAL
  Filled 2024-02-06 (×7): qty 1

## 2024-02-06 MED ORDER — INSULIN GLARGINE-YFGN 100 UNIT/ML ~~LOC~~ SOLN
20.0000 [IU] | Freq: Every day | SUBCUTANEOUS | Status: DC
  Administered 2024-02-06 – 2024-02-07 (×2): 20 [IU] via SUBCUTANEOUS
  Filled 2024-02-06 (×3): qty 0.2

## 2024-02-06 NOTE — Evaluation (Signed)
 Physical Therapy Evaluation Patient Details Name: Jeremiah Burns MRN: [id number] DOB: 07/13/1956 Today's Date: 02/06/2024  History of Present Illness  Pt is a 68 y/o M admitted on 02/02/24 after presenting from outpatient podiatry clinic for concern of worsening L foot ulcer & osteomyelitis. 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 PMH: HTN, DM2, PAD, CKD 3A, anxiety/depression  Clinical Impression  Pt seen for PT evaluation with pt agreeable with encouragement. Pt reports prior to admission he was living alone in a mobile home with 2 steps with B rails to enter, ambulatory with SPC, does endorse some falls in the past. On this date, PT educates pt on weight bearing through L heel in post op shoe only. Pt requires heavy cuing & ~50% assistance for donning post op shoe as pt with low frustration tolerance with it. Pt ambulates to door & back with SPC with decreased ability to weight bear through heel. Pt declined stair practice, reporting he wants to eat his lunch that just arrived; PT provided verbal education re: compensatory pattern for stair negotiation but pt would benefit from practice. Will continue to follow pt acutely.        If plan is discharge home, recommend the following: A little help with walking and/or transfers;A little help with bathing/dressing/bathroom;Assist for transportation;Help with stairs or ramp for entrance;Assistance with cooking/housework;Supervision due to cognitive status   Can travel by private vehicle        Equipment Recommendations BSC/3in1;Wheelchair (measurements PT);Wheelchair cushion (measurements PT)  Recommendations for Other Services       Functional Status Assessment Patient has had a recent decline in their functional status and demonstrates the ability to make significant improvements in function in a reasonable and predictable amount of time.     Precautions / Restrictions Precautions Precautions:  Fall Required Braces or Orthoses:  (LLE post op shoe) Restrictions Weight Bearing Restrictions Per Provider Order: Yes LLE Weight Bearing Per Provider Order: Partial weight bearing LLE Partial Weight Bearing Percentage or Pounds: heel weight bearing in post op shoe      Mobility  Bed Mobility Overal bed mobility: Modified Independent Bed Mobility: Supine to Sit, Sit to Supine     Supine to sit: Modified independent (Device/Increase time), HOB elevated, Used rails (extra time, use of bed rails) Sit to supine: Modified independent (Device/Increase time), HOB elevated        Transfers Overall transfer level: Needs assistance Equipment used: None, Straight cane Transfers: Sit to/from Stand Sit to Stand: Modified independent (Device/Increase time)           General transfer comment: sit<>stand from EOB    Ambulation/Gait Ambulation/Gait assistance: Supervision Gait Distance (Feet): 10 Feet Assistive device: Straight cane Gait Pattern/deviations: Decreased step length - right, Decreased step length - left, Decreased stride length, Trunk flexed Gait velocity: decreased        Stairs Stairs:  (Pt declined practice attempts but PT educated pt on compensatory pattern/technique with pt verbalizing understanding but would benefit from practice, f/u.)          Wheelchair Mobility     Tilt Bed    Modified Rankin (Stroke Patients Only)       Balance Overall balance assessment: Needs assistance Sitting-balance support: Feet supported Sitting balance-Leahy Scale: Good     Standing balance support: Single extremity supported, During functional activity Standing balance-Leahy Scale: Fair  Pertinent Vitals/Pain Pain Assessment Pain Assessment: 0-10 Pain Score: 8  Pain Location: L foot Pain Descriptors / Indicators: Discomfort Pain Intervention(s): Monitored during session    Home Living Family/patient expects to be  discharged to:: Private residence Living Arrangements: Alone   Type of Home: Mobile home Home Access: Stairs to enter Entrance Stairs-Rails: Right;Left;Can reach both Entrance Stairs-Number of Steps: 2   Home Layout: One level Home Equipment: Cane - single point;Shower seat - built in;Grab bars - tub/shower;Hand held Programmer, systems (2 wheels)      Prior Function Prior Level of Function : Driving             Mobility Comments: Household ambulator with SPC. A couple falls in the past 6 months. ADLs Comments: Independent with bathing & dressing, cooking & cleaning.     Extremity/Trunk Assessment   Upper Extremity Assessment Upper Extremity Assessment: Defer to OT evaluation;Overall WFL for tasks assessed    Lower Extremity Assessment Lower Extremity Assessment: LLE deficits/detail;Generalized weakness LLE Deficits / Details: LLE ace wrapped    Cervical / Trunk Assessment Cervical / Trunk Assessment: Kyphotic  Communication   Communication Communication: No apparent difficulties    Cognition Arousal: Alert     PT - Cognitive impairments: No family/caregiver present to determine baseline, Orientation, Sequencing, Problem solving, Safety/Judgement, Memory, Awareness   Orientation impairments: Time (not oriented to month, oriented to year)                   PT - Cognition Comments: poor frustration tolerance with overall session & especially with LLE post op shoe Following commands: Impaired Following commands impaired: Follows one step commands with increased time, Follows multi-step commands inconsistently     Cueing Cueing Techniques: Verbal cues     General Comments General comments (skin integrity, edema, etc.): PT provides education & demo re: donning L post op shoe with pt attempting himself but still requiring assistance from PT; pt with low frustration tolerance with donning/doffing post op shoe, PT provides encouragement.     Exercises     Assessment/Plan    PT Assessment Patient needs continued PT services  PT Problem List Decreased strength;Pain;Cardiopulmonary status limiting activity;Decreased range of motion;Decreased activity tolerance;Decreased cognition;Decreased balance;Decreased safety awareness;Decreased mobility;Decreased knowledge of precautions;Decreased skin integrity;Decreased knowledge of use of DME       PT Treatment Interventions DME instruction;Balance training;Gait training;Neuromuscular re-education;Stair training;Functional mobility training;Therapeutic activities;Therapeutic exercise;Patient/family education    PT Goals (Current goals can be found in the Care Plan section)  Acute Rehab PT Goals Patient Stated Goal: go home, not rehab PT Goal Formulation: With patient Time For Goal Achievement: 02/20/24 Potential to Achieve Goals: Good    Frequency Min 2X/week     Co-evaluation               AM-PAC PT 6 Clicks Mobility  Outcome Measure Help needed turning from your back to your side while in a flat bed without using bedrails?: None Help needed moving from lying on your back to sitting on the side of a flat bed without using bedrails?: None Help needed moving to and from a bed to a chair (including a wheelchair)?: A Little Help needed standing up from a chair using your arms (e.g., wheelchair or bedside chair)?: None Help needed to walk in hospital room?: A Little Help needed climbing 3-5 steps with a railing? : A Little 6 Click Score: 21    End of Session Equipment Utilized During Treatment:  (LLE post op shoe)  Activity Tolerance:  (pt self limiting) Patient left: in bed;with call bell/phone within reach;with bed alarm set Nurse Communication: Mobility status PT Visit Diagnosis: Unsteadiness on feet (R26.81);Pain;Difficulty in walking, not elsewhere classified (R26.2);Other abnormalities of gait and mobility (R26.89);Muscle weakness (generalized) (M62.81) Pain -  Right/Left: Left Pain - part of body: Ankle and joints of foot    Time: 9147-8295 PT Time Calculation (min) (ACUTE ONLY): 10 min   Charges:   PT Evaluation $PT Eval Moderate Complexity: 1 Mod   PT General Charges $$ ACUTE PT VISIT: 1 Visit         Emaline Handsome, PT, DPT 02/06/24, 11:57 AM   Venetta Gill 02/06/2024, 11:55 AM

## 2024-02-06 NOTE — Progress Notes (Signed)
  Subjective:  Patient ID: Jeremiah Burns, male    DOB: 1956-06-05,  MRN: [id number]  Chief Complaint  Patient presents with   Foot Problem    DOS: 02/04/24 Procedure: 1. Partial first ray amputation left foot 2. Irrigation and excisional debridement of ulceration lateral 5th metatarsal head with prep for graft, left foot 3. Application dermal allograft 2 x 1.5 cm, left foot  68 y.o. male seen for post op check.  He reports continued pain this am. We discussed follow up plans and that he should leave his left foot dressing C/D/I until follow up next week.   Review of Systems: Negative except as noted in the HPI. Denies N/V/F/Ch.   Objective:   Constitutional Well developed. Well nourished.  Vascular Foot warm and well perfused. Capillary refill normal to all digits.   No calf pain with palpation  Neurologic Normal speech. Oriented to person, place, and time. Epicritic sensation diminished to left foot  Dermatologic First ray amputation site healing very nicely no dehiscence, drainage, maceration or evidence of necrosis/infection   Orthopedic: S/p L partial first ray amputation    Radiographs: Interval amputation of the first digit to the proximal metatarsal shaft with expected appearance.  Pathology: Pending  Micro: RARE STAPHYLOCOCCUS AUREUS   RARE PSEUDOMONAS AERUGINOSA   Assessment:   1. Diabetic foot infection (HCC)   2. Osteomyelitis of toe of left foot (HCC)   S/p Left foot wound debridement with graft application and left partial first ray amputation  Plan:  Patient was evaluated and treated and all questions answered.  POD # 2 s/p Left partial first ray amputation and wound debridement with kerecis graft app -Progressing as expected post op. Amputation site healing well. No evidence dehiscence necrosis or infection. Clinically appears that clean margin obtained. Path pending - Cx with Staph Auerus and P. Aeruginosa - plan for 7 days doxycyline and  Ciprofloxacin pending final culture susceptibilities. Can adjust outpatient if needed.  - PT OT today, heel wbat in post op shoe  -XR: Expected post op changes -WB Status: WB to heel in post op shoe -Sutures: remain intact 2-3 weeks. -Medications/ABX: Doxy/cipro pending culture susceptibilities. Rec 7 days po abx assuming pathology reports clear margin  -Dressing: Changed this am. Will remain intact now until follow up next week - F/u Plan: Follow up next week Thursday with myself in [place] or in [place] with Dr. Wyn Heater. Office will call. Will sign off stable for dc if going home later today or tmrw. Please msg with  questions        Maridee Shoemaker, DPM [facility] / [facility]

## 2024-02-06 NOTE — Progress Notes (Signed)
 S/p ray amputation with likely clean margins per podiatry. Rec 7d of PO abx. D/w Dr [place] and we will transition to Augmentin/doxy x7d.   Addendum  Culture back with staph aureus and pseudomonas. We will adjust abx to doxy and cipro. Dr [place] is aware.   Ivery Marking, PharmD, BCIDP, AAHIVP, CPP Infectious Disease Pharmacist 02/06/2024 7:49 AM

## 2024-02-06 NOTE — Progress Notes (Signed)
 OT Cancellation Note  Patient Details Name: Jeremiah Burns MRN: [id number] DOB: Jan 12, 1956   Cancelled Treatment:    Reason Eval/Treat Not Completed: Patient declined, no reason specified.  Sleeping, asking OT to return next day.  Audric Venn D Kursten Kruk 02/06/2024, 3:41 PM 02/06/2024  RP, OTR/L  Acute Rehabilitation Services  Office:  [phone]

## 2024-02-06 NOTE — Progress Notes (Signed)
 PROGRESS NOTE    Jeremiah Burns  WUJ:[id number] DOB: [birth year] DOA: 02/02/2024 PCP: [place], Sarah T, MD    Brief Narrative:   Jeremiah Burns is a 68 y.o. male with past medical history significant for HTN, DM2, PAD, CKD stage IIIa, anxiety/depression who presented to [facility] ED on 02/02/2024 with worsening of his left foot ulcer.  Patient was seen in outpatient podiatry clinic for routine follow-up, concern for osteomyelitis and was sent to the hospital for further evaluation and management.  Significant events: 4/3: underwent angioplasty of the left posterior tibial artery  4/4: I&D with sesamoidectomy.  Bone biopsy and dermal allograft by podiatry 4/11: Discharge from the hospital on Zyvox  and Bactrim .  EOT 4/25 4/30: hospitalization for hematemesis.  AKI on CKD 3B 6/16: Admit with left foot infection, osteomyelitis, septic arthritis, myositis 6/18: Partial first ray amputation, I&D, allograft left foot, podiatry Dr. Rosemarie Conquest   Assessment & Plan:   Left foot diabetic infection with osteomyelitis Left first MTP septic arthritis Left foot cellulitis/myositis Patient presenting from podiatry outpatient office with concern for osteomyelitis with worsening of his left foot ulcer.  Left foot x-ray with plantar soft tissue defect, bony changes first metatarsal phalangeal joint consistent with septic arthritis and or osteomyelitis.  MRI left foot without contrast with soft tissue wound/ulceration inferior first MTP joint with gas adjacent soft tissues consistent with septic arthritis/osteomyelitis; likely reactive diffuse myositis, dorsal cellulitis.  Podiatry was consulted and patient underwent partial first ray amputation, I&D with excisional debridement lateral fifth metatarsal head and application of allograft by Dr. Rosemarie Conquest on 02/04/2024. -- Podiatry following, appreciate assistance -- Minimal weightbearing in postop shoe to heel for transfers -- Blood cultures x 2: No growth x  4  days -- Operative culture with rare Staphylococcus aureus, Pseudomonas; susceptibilities pending -- Doxycycline 100 mg p.o. twice daily x 7 days -- Cipro 5 mg p.o. twice daily x 7 days -- Tylenol  650 mg p.o. every 6 hours as needed mild pain -- Oxycodone  5 mg p.o. every 4 hours as needed moderate pain  -- Dilaudid 0.5 mg IV every 4 hours as needed severe pain -- PT recommending home health, 3 and 1 bedside commode, wheelchair; DME ordered -- OT: Evaluation pending -- Anticipate likely discharge home tomorrow with home health -- Follow-up with podiatry 6/26  CKD stage IIIa Baseline creatinine 1.4, creatinine admission 1.69. -- Cr 1.69>1.29>1.37>1.48 -- Holding lisinopril -- Avoid nephrotoxins, renally dose all medications -- BMP in am  DM2, uncontrolled with hyperglycemia Hemoglobin A1c 9.2%, poorly controlled.  Home regimen includes Jardiance 25 mg p.o. daily, Semglee  25u [place] BID, Humalog sliding scale -- Diabetic educator following, appreciate assistance -- Semglee  20u  daily -- Sensitive SSI for coverage -- CBG's before every meal/at bedtime  PAD s/p angioplasty left posterior tibial artery/10/2023 Currently not on antiplatelet medication due to stopping aspirin  and Plavix  for GI bleed 4/30.  -- Would benefit from restarting aspirin  and Plavix  postoperatively -- Continue atorvastatin  40 mg p.o. daily  Hx ulcerative esophagitis/gastritis Underwent EGD on 12/16/2023 with findings of LA grade D ulcerative esophagitis with no active bleeding, nonbleeding gastritis. -- Protonix  40 mg p.o. BID  Microcytic anemia Hemoglobin 9.0, MCV 79.7.  Anemia panel with iron 24, TIBC 416, ferritin 5, folate 7.5, vitamin B12 370. -- Start ferrous sulfate 325 g p.o. daily -- Would benefit from outpatient follow-up with gastroenterology for screening colonoscopy  Anxiety/depression -- Seroquel  50 mg p.o. daily   DVT prophylaxis: SCDs Start: 02/02/24 2345    Code Status: Full  Code Family  Communication: No family present at bedside  Disposition Plan:  Level of care: Med-Surg Status is: Inpatient Remains inpatient appropriate because: OT evaluation, await operative cultures/pathology; anticipate discharge home tomorrow with home health    Consultants:  Podiatry, Dr. Rosemarie Conquest  Procedures:  Partial first ray amputation, I&D, allograft left foot, podiatry Dr. Rosemarie Conquest, 6/18  Antimicrobials:  Zyvox  6/16 - 6/18 Unasyn 4/16 - 6/18 Doxycycline 6/19>> Augmentin 6/19>>   Subjective: Patient seen examined bedside, lying in bed.  Searching through telephone.  Appetite improved.  Pain controlled.  Seen by PT with recommendation of home health.  Awaiting OT recommendations.  Anticipate likely able to discharge home tomorrow.  No other specific questions, concerns or complaints at this time.  Denies headache, no chest pain, no shortness of breath, no abdominal pain, no fever/chills/night sweats, no nausea/vomiting/diarrhea.  No acute events overnight per nursing staff.  Objective: Vitals:   02/05/24 1625 02/05/24 2126 02/06/24 0438 02/06/24 0739  BP: 134/89 (!) 117/56 118/73 (!) 159/88  Pulse: 96  85 93  Resp: 18 14 15 18   Temp: 98.3 F (36.8 C) 98.2 F (36.8 C) 98.7 F (37.1 C) 98.3 F (36.8 C)  TempSrc: Oral Oral Oral Oral  SpO2: 97% 97% 100% 97%  Weight:      Height:        Intake/Output Summary (Last 24 hours) at 02/06/2024 1306 Last data filed at 02/06/2024 0800 Gross per 24 hour  Intake 480 ml  Output 690 ml  Net -210 ml   Filed Weights   02/02/24 2010 02/04/24 1015 02/04/24 1659  Weight: 81.6 kg 81.6 kg 91.4 kg    Examination:  Physical Exam: GEN: NAD, alert and oriented x 3, chronically ill appearance, appears older than stated age HEENT: NCAT, PERRL, EOMI, sclera clear, MMM PULM: CTAB w/o wheezes/crackles, normal respiratory effort, on room air CV: RRR w/o M/G/R GI: abd soft, NTND, + BS MSK: no peripheral edema, moves all extremities  independently NEURO: No focal neurological deficit PSYCH: normal mood/affect, poor insight Integumentary: Left foot with surgical dressing/Ace wrap in place, clean/dry/intact, otherwise no other concerning rashes/lesions/wounds noted on exposed skin surfaces     Data Reviewed: I have personally reviewed following labs and imaging studies  CBC: Recent Labs  Lab 02/02/24 1830 02/03/24 0527 02/04/24 0511 02/05/24 0527  WBC 9.5 7.3 7.2 9.1  NEUTROABS 6.5  --   --   --   HGB 10.4* 9.4* 9.0* 9.0*  HCT 33.0* 30.0* 29.4* 28.9*  MCV 78.8* 77.7* 79.7* 78.7*  PLT 419* 343 329 335   Basic Metabolic Panel: Recent Labs  Lab 02/02/24 1830 02/03/24 0527 02/04/24 0511 02/05/24 0527  NA 132* 134* 133* 131*  K 5.1 4.0 4.0 4.1  CL 101 104 103 100  CO2 20* 20* 22 23  GLUCOSE 294* 158* 155* 233*  BUN 34* 29* 25* 22  CREATININE 1.69* 1.29* 1.37* 1.48*  CALCIUM  9.8 8.9 8.7* 8.6*  MG  --   --   --  2.0   GFR: Estimated Creatinine Clearance: 56 mL/min (A) (by C-G formula based on SCr of 1.48 mg/dL (H)). Liver Function Tests: Recent Labs  Lab 02/02/24 1830  AST 18  ALT 19  ALKPHOS 58  BILITOT 0.4  PROT 7.7  ALBUMIN  3.8   No results for input(s): LIPASE, AMYLASE in the last 168 hours. No results for input(s): AMMONIA in the last 168 hours. Coagulation Profile: No results for input(s): INR, PROTIME in the last 168 hours. Cardiac Enzymes:  No results for input(s): CKTOTAL, CKMB, CKMBINDEX, TROPONINI in the last 168 hours. BNP (last 3 results) No results for input(s): PROBNP in the last 8760 hours. HbA1C: No results for input(s): HGBA1C in the last 72 hours.  CBG: Recent Labs  Lab 02/05/24 1623 02/05/24 2246 02/06/24 0430 02/06/24 0736 02/06/24 1156  GLUCAP 163* 189* 182* 145* 155*   Lipid Profile: No results for input(s): CHOL, HDL, LDLCALC, TRIG, CHOLHDL, LDLDIRECT in the last 72 hours. Thyroid Function Tests: No results for input(s):  TSH, T4TOTAL, FREET4, T3FREE, THYROIDAB in the last 72 hours. Anemia Panel: Recent Labs    02/05/24 0527  VITAMINB12 370  FOLATE 7.5  FERRITIN 5*  TIBC 416  IRON 24*  RETICCTPCT 1.5   Sepsis Labs: Recent Labs  Lab 02/02/24 1845 02/02/24 2328  LATICACIDVEN 2.2* 1.6    Recent Results (from the past 240 hours)  Blood Cultures x 2 sites     Status: None (Preliminary result)   Collection Time: 02/02/24  6:30 PM   Specimen: BLOOD RIGHT FOREARM  Result Value Ref Range Status   Specimen Description BLOOD RIGHT FOREARM  Final   Special Requests   Final    BOTTLES DRAWN AEROBIC AND ANAEROBIC Blood Culture adequate volume   Culture   Final    NO GROWTH 4 DAYS Performed at [facility], [address] [place], [place] [address]    Report Status PENDING  Incomplete  Blood Cultures x 2 sites     Status: None (Preliminary result)   Collection Time: 02/02/24  7:37 PM   Specimen: BLOOD  Result Value Ref Range Status   Specimen Description BLOOD LEFT ANTECUBITAL  Final   Special Requests   Final    BOTTLES DRAWN AEROBIC AND ANAEROBIC Blood Culture adequate volume   Culture   Final    NO GROWTH 4 DAYS Performed at [facility], [address], [place], [place] [address]    Report Status PENDING  Incomplete  MRSA Next Gen by PCR, Nasal     Status: None   Collection Time: 02/04/24  6:11 AM   Specimen: Nasal Mucosa; Nasal Swab  Result Value Ref Range Status   MRSA by PCR Next Gen NOT DETECTED NOT DETECTED Final    Comment: (NOTE) The GeneXpert MRSA Assay (FDA approved for NASAL specimens only), is one component of a comprehensive MRSA colonization surveillance program. It is not intended to diagnose MRSA infection nor to guide or monitor treatment for MRSA infections. Test performance is not FDA approved in patients less than 56 years old. Performed at [facility], [address] [place], [place] [address]   Aerobic/Anaerobic Culture w Gram Stain  (surgical/deep wound)     Status: None (Preliminary result)   Collection Time: 02/04/24 11:35 AM   Specimen: Bone; Tissue  Result Value Ref Range Status   Specimen Description BONE  Final   Special Requests NONE  Final   Gram Stain NO WBC SEEN RARE GRAM POSITIVE COCCI IN PAIRS   Final   Culture   Final    RARE STAPHYLOCOCCUS AUREUS RARE PSEUDOMONAS AERUGINOSA SUSCEPTIBILITIES TO FOLLOW Performed at [facility], [address], [place], [place] [address]    Report Status PENDING  Incomplete   Organism ID, Bacteria STAPHYLOCOCCUS AUREUS  Final      Susceptibility   Staphylococcus aureus - MIC*    CIPROFLOXACIN <=0.5 SENSITIVE Sensitive     ERYTHROMYCIN 4 INTERMEDIATE Intermediate  GENTAMICIN <=0.5 SENSITIVE Sensitive     OXACILLIN 0.5 SENSITIVE Sensitive     TETRACYCLINE >=16 RESISTANT Resistant     VANCOMYCIN  1 SENSITIVE Sensitive     TRIMETH /SULFA  <=10 SENSITIVE Sensitive     CLINDAMYCIN <=0.25 SENSITIVE Sensitive     RIFAMPIN <=0.5 SENSITIVE Sensitive     Inducible Clindamycin NEGATIVE Sensitive     LINEZOLID  1 SENSITIVE Sensitive     * RARE STAPHYLOCOCCUS AUREUS         Radiology Studies: No results found.       Scheduled Meds:  atorvastatin   40 mg Oral Daily   ciprofloxacin  500 mg Oral BID   doxycycline  100 mg Oral Q12H   feeding supplement  237 mL Oral BID BM   insulin  aspart  0-9 Units Subcutaneous Q4H   insulin  glargine-yfgn  20 Units Subcutaneous QHS   multivitamin with minerals  1 tablet Oral Daily   pantoprazole   40 mg Oral BID WC   QUEtiapine   50 mg Oral Daily   senna-docusate  1 tablet Oral BID   Continuous Infusions:     LOS: 4 days    Time spent: 48 minutes spent on 02/06/2024 caring for this patient face-to-face including chart review, ordering labs/tests, documenting, discussion with nursing staff, consultants, updating family and interview/physical exam    Rema Care [place], DO [facility] Available via Epic secure  chat 7am-7pm After these hours, please refer to coverage provider listed on [email or web address] 02/06/2024, 1:06 PM

## 2024-02-07 DIAGNOSIS — E11628 Type 2 diabetes mellitus with other skin complications: Secondary | ICD-10-CM | POA: Diagnosis not present

## 2024-02-07 DIAGNOSIS — L089 Local infection of the skin and subcutaneous tissue, unspecified: Secondary | ICD-10-CM | POA: Diagnosis not present

## 2024-02-07 LAB — CULTURE, BLOOD (ROUTINE X 2)
Culture: NO GROWTH
Culture: NO GROWTH
Special Requests: ADEQUATE
Special Requests: ADEQUATE

## 2024-02-07 LAB — GLUCOSE, CAPILLARY
Glucose-Capillary: 119 mg/dL — ABNORMAL HIGH (ref 70–99)
Glucose-Capillary: 127 mg/dL — ABNORMAL HIGH (ref 70–99)
Glucose-Capillary: 156 mg/dL — ABNORMAL HIGH (ref 70–99)
Glucose-Capillary: 158 mg/dL — ABNORMAL HIGH (ref 70–99)
Glucose-Capillary: 190 mg/dL — ABNORMAL HIGH (ref 70–99)
Glucose-Capillary: 220 mg/dL — ABNORMAL HIGH (ref 70–99)
Glucose-Capillary: 306 mg/dL — ABNORMAL HIGH (ref 70–99)

## 2024-02-07 MED ORDER — LINEZOLID 600 MG PO TABS
600.0000 mg | ORAL_TABLET | Freq: Two times a day (BID) | ORAL | Status: DC
  Administered 2024-02-07 – 2024-02-09 (×5): 600 mg via ORAL
  Filled 2024-02-07 (×6): qty 1

## 2024-02-07 NOTE — Progress Notes (Signed)
 OT Cancellation Note  Patient Details Name: Jeremiah Burns MRN: [id number] DOB: 1956-08-16   Cancelled Treatment:    Reason Eval/Treat Not Completed: (P) Patient declined, no reason specified, Pt aggravated, passionately declined, states he has not been sleeping well. Pt stomped around room when asked if he would be willing to get OOB, no AD, states he has been going to bathroom and back independently. Will return as Pt allows.  Elouise JONELLE Bott 02/07/2024, 2:41 PM

## 2024-02-07 NOTE — Progress Notes (Signed)
 PT Cancellation Note  Patient Details Name: Jeremiah Burns MRN: [id number] DOB: 1956/02/21   Cancelled Treatment:    Reason Eval/Treat Not Completed: Other (comment) (Pt reports just walking to the bathroom by himself without difficulty. States he does not feel well and does not want to participate with PT at this time.  Will f/u Monday if pt is still here.)   Delos Oneil PARAS 02/07/2024, 11:05 AM Oneil Delos, PT   Acute Rehabilitation Services  Office [phone] 02/07/2024

## 2024-02-07 NOTE — Progress Notes (Signed)
 PROGRESS NOTE    Jeremiah Burns  FMW:[id number] DOB: 03/29/1956 DOA: 02/02/2024 PCP: [place], Sarah T, MD    Brief Narrative:   Jeremiah Burns is a 68 y.o. male with past medical history significant for HTN, DM2, PAD, CKD stage IIIa, anxiety/depression who presented to [facility] ED on 02/02/2024 with worsening of his left foot ulcer.  Patient was seen in outpatient podiatry clinic for routine follow-up, concern for osteomyelitis and was sent to the hospital for further evaluation and management.  Significant events: 4/3: underwent angioplasty of the left posterior tibial artery  4/4: I&D with sesamoidectomy.  Bone biopsy and dermal allograft by podiatry 4/11: Discharge from the hospital on Zyvox  and Bactrim .  EOT 4/25 4/30: hospitalization for hematemesis.  AKI on CKD 3B 6/16: Admit with left foot infection, osteomyelitis, septic arthritis, myositis 6/18: Partial first ray amputation, I&D, allograft left foot, podiatry Dr. Malvin   Assessment & Plan:   Left foot diabetic infection with osteomyelitis Left first MTP septic arthritis Left foot cellulitis/myositis Patient presenting from podiatry outpatient office with concern for osteomyelitis with worsening of his left foot ulcer.  Left foot x-ray with plantar soft tissue defect, bony changes first metatarsal phalangeal joint consistent with septic arthritis and or osteomyelitis.  MRI left foot without contrast with soft tissue wound/ulceration inferior first MTP joint with gas adjacent soft tissues consistent with septic arthritis/osteomyelitis; likely reactive diffuse myositis, dorsal cellulitis.  Podiatry was consulted and patient underwent partial first ray amputation, I&D with excisional debridement lateral fifth metatarsal head and application of allograft by Dr. Malvin on 02/04/2024. -- Podiatry following, appreciate assistance -- Minimal weightbearing in postop shoe to heel for transfers -- Blood cultures x 2: No growth x  4  days -- Operative culture with rare Staphylococcus aureus, Pseudomonas; susceptibilities pending -- Linezolid  600 mg p.o. twice daily x 7 days -- Cipro  500 mg p.o. twice daily x 7 days -- Tylenol  650 mg p.o. every 6 hours as needed mild pain -- Oxycodone  5 mg p.o. every 4 hours as needed moderate pain  -- Dilaudid  0.5 mg IV every 4 hours as needed severe pain -- PT recommending home health, 3 and 1 bedside commode, wheelchair; DME ordered -- OT: Evaluation pending -- Plan discharge with home health; likely on Monday if participates with therapy tomorrow -- Follow-up with podiatry 6/26  CKD stage IIIa Baseline creatinine 1.4, creatinine admission 1.69. -- Cr 1.69>1.29>1.37>1.48 -- Holding lisinopril; likely will discontinue as patient's blood pressure been well-controlled off of this medication -- Avoid nephrotoxins, renally dose all medications -- BMP in am  DM2, uncontrolled with hyperglycemia Hemoglobin A1c 9.2%, poorly controlled.  Home regimen includes Jardiance 25 mg p.o. daily, Semglee  25u [place] BID, Humalog sliding scale -- Diabetic educator following, appreciate assistance -- Semglee  20u [place] daily -- Sensitive SSI for coverage -- CBG's before every meal/at bedtime  PAD s/p angioplasty left posterior tibial artery/10/2023 Currently not on antiplatelet medication due to stopping aspirin  and Plavix  for GI bleed 4/30.  -- Would benefit from restarting aspirin  and Plavix  postoperatively -- Continue atorvastatin  40 mg p.o. daily  Hx ulcerative esophagitis/gastritis Underwent EGD on 12/16/2023 with findings of LA grade D ulcerative esophagitis with no active bleeding, nonbleeding gastritis. -- Protonix  40 mg p.o. BID  Microcytic anemia Hemoglobin 9.0, MCV 79.7.  Anemia panel with iron 24, TIBC 416, ferritin 5, folate 7.5, vitamin B12 370. -- Start ferrous sulfate 325 g p.o. daily -- Would benefit from outpatient follow-up with gastroenterology for screening  colonoscopy  Anxiety/depression --  Seroquel  50 mg p.o. daily   DVT prophylaxis: SCDs Start: 02/02/24 2345    Code Status: Full Code Family Communication: No family present at bedside  Disposition Plan:  Level of care: Med-Surg Status is: Inpatient Remains inpatient appropriate because: OT evaluation, await operative cultures/pathology; anticipate discharge home tomorrow with home health    Consultants:  Podiatry, Dr. Malvin  Procedures:  Partial first ray amputation, I&D, allograft left foot, podiatry Dr. Malvin, 6/18  Antimicrobials:  Zyvox  6/16 - 6/18 Unasyn  4/16 - 6/18 Doxycycline  6/19>> Augmentin  6/19>>   Subjective: Patient seen examined bedside, lying in bed.  RN present at bedside.  Reports do not feel well today.  Refuse working with therapy today.  No other specific questions, concerns or complaints at this time.  Denies headache, no chest pain, no shortness of breath, no abdominal pain, no fever/chills/night sweats, no nausea/vomiting/diarrhea.  No acute events overnight per nursing staff.  Objective: Vitals:   02/06/24 0739 02/06/24 1657 02/07/24 0455 02/07/24 0844  BP: (!) 159/88 (!) 147/70 128/66 134/72  Pulse: 93 88 86 79  Resp: 18 18 18 18   Temp: 98.3 F (36.8 C) 98.5 F (36.9 C) 98.4 F (36.9 C) 98.2 F (36.8 C)  TempSrc: Oral Oral    SpO2: 97% 97% 97% 98%  Weight:      Height:        Intake/Output Summary (Last 24 hours) at 02/07/2024 1343 Last data filed at 02/07/2024 1229 Gross per 24 hour  Intake 300 ml  Output 0 ml  Net 300 ml   Filed Weights   02/02/24 2010 02/04/24 1015 02/04/24 1659  Weight: 81.6 kg 81.6 kg 91.4 kg    Examination:  Physical Exam: GEN: NAD, alert and oriented x 3, chronically ill appearance, appears older than stated age HEENT: NCAT, PERRL, EOMI, sclera clear, MMM PULM: CTAB w/o wheezes/crackles, normal respiratory effort, on room air CV: RRR w/o M/G/R GI: abd soft, NTND, + BS MSK: no peripheral  edema, moves all extremities independently NEURO: No focal neurological deficit PSYCH: normal mood/affect, poor insight Integumentary: Left foot with surgical dressing/Ace wrap in place, clean/dry/intact, otherwise no other concerning rashes/lesions/wounds noted on exposed skin surfaces     Data Reviewed: I have personally reviewed following labs and imaging studies  CBC: Recent Labs  Lab 02/02/24 1830 02/03/24 0527 02/04/24 0511 02/05/24 0527  WBC 9.5 7.3 7.2 9.1  NEUTROABS 6.5  --   --   --   HGB 10.4* 9.4* 9.0* 9.0*  HCT 33.0* 30.0* 29.4* 28.9*  MCV 78.8* 77.7* 79.7* 78.7*  PLT 419* 343 329 335   Basic Metabolic Panel: Recent Labs  Lab 02/02/24 1830 02/03/24 0527 02/04/24 0511 02/05/24 0527  NA 132* 134* 133* 131*  K 5.1 4.0 4.0 4.1  CL 101 104 103 100  CO2 20* 20* 22 23  GLUCOSE 294* 158* 155* 233*  BUN 34* 29* 25* 22  CREATININE 1.69* 1.29* 1.37* 1.48*  CALCIUM  9.8 8.9 8.7* 8.6*  MG  --   --   --  2.0   GFR: Estimated Creatinine Clearance: 56 mL/min (A) (by C-G formula based on SCr of 1.48 mg/dL (H)). Liver Function Tests: Recent Labs  Lab 02/02/24 1830  AST 18  ALT 19  ALKPHOS 58  BILITOT 0.4  PROT 7.7  ALBUMIN  3.8   No results for input(s): LIPASE, AMYLASE in the last 168 hours. No results for input(s): AMMONIA in the last 168 hours. Coagulation Profile: No results for input(s): INR, PROTIME in the  last 168 hours. Cardiac Enzymes: No results for input(s): CKTOTAL, CKMB, CKMBINDEX, TROPONINI in the last 168 hours. BNP (last 3 results) No results for input(s): PROBNP in the last 8760 hours. HbA1C: No results for input(s): HGBA1C in the last 72 hours.  CBG: Recent Labs  Lab 02/06/24 2001 02/07/24 0025 02/07/24 0457 02/07/24 0845 02/07/24 1220  GLUCAP 186* 190* 158* 127* 119*   Lipid Profile: No results for input(s): CHOL, HDL, LDLCALC, TRIG, CHOLHDL, LDLDIRECT in the last 72 hours. Thyroid Function  Tests: No results for input(s): TSH, T4TOTAL, FREET4, T3FREE, THYROIDAB in the last 72 hours. Anemia Panel: Recent Labs    02/05/24 0527  VITAMINB12 370  FOLATE 7.5  FERRITIN 5*  TIBC 416  IRON 24*  RETICCTPCT 1.5   Sepsis Labs: Recent Labs  Lab 02/02/24 1845 02/02/24 2328  LATICACIDVEN 2.2* 1.6    Recent Results (from the past 240 hours)  Blood Cultures x 2 sites     Status: None   Collection Time: 02/02/24  6:30 PM   Specimen: BLOOD RIGHT FOREARM  Result Value Ref Range Status   Specimen Description BLOOD RIGHT FOREARM  Final   Special Requests   Final    BOTTLES DRAWN AEROBIC AND ANAEROBIC Blood Culture adequate volume   Culture   Final    NO GROWTH 5 DAYS Performed at [facility], [address] [place], [place] [address]    Report Status 02/07/2024 FINAL  Final  Blood Cultures x 2 sites     Status: None   Collection Time: 02/02/24  7:37 PM   Specimen: BLOOD  Result Value Ref Range Status   Specimen Description BLOOD LEFT ANTECUBITAL  Final   Special Requests   Final    BOTTLES DRAWN AEROBIC AND ANAEROBIC Blood Culture adequate volume   Culture   Final    NO GROWTH 5 DAYS Performed at [facility], [address] [place], [place] [address]    Report Status 02/07/2024 FINAL  Final  MRSA Next Gen by PCR, Nasal     Status: None   Collection Time: 02/04/24  6:11 AM   Specimen: Nasal Mucosa; Nasal Swab  Result Value Ref Range Status   MRSA by PCR Next Gen NOT DETECTED NOT DETECTED Final    Comment: (NOTE) The GeneXpert MRSA Assay (FDA approved for NASAL specimens only), is one component of a comprehensive MRSA colonization surveillance program. It is not intended to diagnose MRSA infection nor to guide or monitor treatment for MRSA infections. Test performance is not FDA approved in patients less than 74 years old. Performed at [facility], [address], [place], [place] [address]   Aerobic/Anaerobic Culture w Gram Stain  (surgical/deep wound)     Status: None (Preliminary result)   Collection Time: 02/04/24 11:35 AM   Specimen: Bone; Tissue  Result Value Ref Range Status   Specimen Description BONE  Final   Special Requests NONE  Final   Gram Stain   Final    NO WBC SEEN RARE GRAM POSITIVE COCCI IN PAIRS Performed at [facility], [address] [place], [place] [address]    Culture   Final    RARE STAPHYLOCOCCUS AUREUS RARE PSEUDOMONAS AERUGINOSA NO ANAEROBES ISOLATED; CULTURE IN PROGRESS FOR 5 DAYS    Report Status PENDING  Incomplete   Organism ID, Bacteria STAPHYLOCOCCUS AUREUS  Final   Organism ID, Bacteria PSEUDOMONAS AERUGINOSA  Final      Susceptibility   Pseudomonas  aeruginosa - MIC*    CEFTAZIDIME <=1 SENSITIVE Sensitive     CIPROFLOXACIN  <=0.25 SENSITIVE Sensitive     GENTAMICIN <=1 SENSITIVE Sensitive     IMIPENEM 2 SENSITIVE Sensitive     PIP/TAZO <=4 SENSITIVE Sensitive ug/mL    CEFEPIME 1 SENSITIVE Sensitive     * RARE PSEUDOMONAS AERUGINOSA   Staphylococcus aureus - MIC*    CIPROFLOXACIN  <=0.5 SENSITIVE Sensitive     ERYTHROMYCIN 4 INTERMEDIATE Intermediate     GENTAMICIN <=0.5 SENSITIVE Sensitive     OXACILLIN 0.5 SENSITIVE Sensitive     TETRACYCLINE >=16 RESISTANT Resistant     VANCOMYCIN  1 SENSITIVE Sensitive     TRIMETH /SULFA  <=10 SENSITIVE Sensitive     CLINDAMYCIN <=0.25 SENSITIVE Sensitive     RIFAMPIN <=0.5 SENSITIVE Sensitive     Inducible Clindamycin NEGATIVE Sensitive     LINEZOLID  1 SENSITIVE Sensitive     * RARE STAPHYLOCOCCUS AUREUS         Radiology Studies: No results found.       Scheduled Meds:  atorvastatin   40 mg Oral Daily   ciprofloxacin   500 mg Oral BID   feeding supplement  237 mL Oral BID BM   insulin  aspart  0-9 Units Subcutaneous Q4H   insulin  glargine-yfgn  20 Units Subcutaneous QHS   linezolid   600 mg Oral Q12H   multivitamin with minerals  1 tablet Oral Daily   pantoprazole   40 mg Oral BID WC   QUEtiapine   50 mg Oral  Daily   senna-docusate  1 tablet Oral BID   Continuous Infusions:     LOS: 5 days    Time spent: 48 minutes spent on 02/07/2024 caring for this patient face-to-face including chart review, ordering labs/tests, documenting, discussion with nursing staff, consultants, updating family and interview/physical exam    Camellia PARAS [place], DO [facility] Available via Epic secure chat 7am-7pm After these hours, please refer to coverage provider listed on [email or web address] 02/07/2024, 1:43 PM

## 2024-02-07 NOTE — Plan of Care (Signed)

## 2024-02-08 DIAGNOSIS — E11628 Type 2 diabetes mellitus with other skin complications: Secondary | ICD-10-CM | POA: Diagnosis not present

## 2024-02-08 DIAGNOSIS — L089 Local infection of the skin and subcutaneous tissue, unspecified: Secondary | ICD-10-CM | POA: Diagnosis not present

## 2024-02-08 LAB — GLUCOSE, CAPILLARY
Glucose-Capillary: 123 mg/dL — ABNORMAL HIGH (ref 70–99)
Glucose-Capillary: 113 mg/dL — ABNORMAL HIGH (ref 70–99)
Glucose-Capillary: 137 mg/dL — ABNORMAL HIGH (ref 70–99)
Glucose-Capillary: 219 mg/dL — ABNORMAL HIGH (ref 70–99)
Glucose-Capillary: 184 mg/dL — ABNORMAL HIGH (ref 70–99)

## 2024-02-08 LAB — BASIC METABOLIC PANEL WITH GFR
Anion gap: 7 (ref 5–15)
BUN: 26 mg/dL — ABNORMAL HIGH (ref 8–23)
CO2: 26 mmol/L (ref 22–32)
Calcium: 8.7 mg/dL — ABNORMAL LOW (ref 8.9–10.3)
Chloride: 103 mmol/L (ref 98–111)
Creatinine, Ser: 1.52 mg/dL — ABNORMAL HIGH (ref 0.61–1.24)
GFR, Estimated: 50 mL/min — ABNORMAL LOW (ref >60)
Glucose, Bld: 134 mg/dL — ABNORMAL HIGH (ref 70–99)
Potassium: 4.5 mmol/L (ref 3.5–5.1)
Sodium: 136 mmol/L (ref 135–145)

## 2024-02-08 LAB — CBC
HCT: 27.6 % — ABNORMAL LOW (ref 39.0–52.0)
Hemoglobin: 8.7 g/dL — ABNORMAL LOW (ref 13.0–17.0)
MCH: 24.8 pg — ABNORMAL LOW (ref 26.0–34.0)
MCHC: 31.5 g/dL (ref 30.0–36.0)
MCV: 78.6 fL — ABNORMAL LOW (ref 80.0–100.0)
Platelets: 288 10*3/uL (ref 150–400)
RBC: 3.51 MIL/uL — ABNORMAL LOW (ref 4.22–5.81)
RDW: 14.5 % (ref 11.5–15.5)
WBC: 7.8 10*3/uL (ref 4.0–10.5)
nRBC: 0 % (ref 0.0–0.2)

## 2024-02-08 LAB — MAGNESIUM: Magnesium: 2.1 mg/dL (ref 1.7–2.4)

## 2024-02-08 LAB — VITAMIN C: Vitamin C: 0.2 mg/dL — ABNORMAL LOW (ref 0.4–2.0)

## 2024-02-08 MED ORDER — INSULIN GLARGINE-YFGN 100 UNIT/ML ~~LOC~~ SOLN
25.0000 [IU] | Freq: Every day | SUBCUTANEOUS | Status: DC
  Administered 2024-02-08: 25 [IU] via SUBCUTANEOUS
  Filled 2024-02-08 (×2): qty 0.25

## 2024-02-08 NOTE — Progress Notes (Signed)
 PROGRESS NOTE    Jeremiah Burns  FMW:[id number] DOB: 04-02-1956 DOA: 02/02/2024 PCP: [place], Sarah T, MD    Brief Narrative:   Jeremiah Burns is a 68 y.o. male with past medical history significant for HTN, DM2, PAD, CKD stage IIIa, anxiety/depression who presented to [facility] ED on 02/02/2024 with worsening of his left foot ulcer.  Patient was seen in outpatient podiatry clinic for routine follow-up, concern for osteomyelitis and was sent to the hospital for further evaluation and management.  Significant events: 4/3: underwent angioplasty of the left posterior tibial artery  4/4: I&D with sesamoidectomy.  Bone biopsy and dermal allograft by podiatry 4/11: Discharge from the hospital on Zyvox  and Bactrim .  EOT 4/25 4/30: hospitalization for hematemesis.  AKI on CKD 3B 6/16: Admit with left foot infection, osteomyelitis, septic arthritis, myositis 6/18: Partial first ray amputation, I&D, allograft left foot, podiatry Dr. Malvin   Assessment & Plan:   Left foot diabetic infection with osteomyelitis Left first MTP septic arthritis Left foot cellulitis/myositis Patient presenting from podiatry outpatient office with concern for osteomyelitis with worsening of his left foot ulcer.  Left foot x-ray with plantar soft tissue defect, bony changes first metatarsal phalangeal joint consistent with septic arthritis and or osteomyelitis.  MRI left foot without contrast with soft tissue wound/ulceration inferior first MTP joint with gas adjacent soft tissues consistent with septic arthritis/osteomyelitis; likely reactive diffuse myositis, dorsal cellulitis.  Podiatry was consulted and patient underwent partial first ray amputation, I&D with excisional debridement lateral fifth metatarsal head and application of allograft by Dr. Malvin on 02/04/2024. -- Podiatry following, appreciate assistance -- Minimal weightbearing in postop shoe to heel for transfers -- Blood cultures x 2: No growth x  4  days -- Operative culture with rare Staphylococcus aureus, Pseudomonas; susceptibilities pending -- Linezolid  600 mg p.o. twice daily x 7 days -- Cipro  500 mg p.o. twice daily x 7 days -- Tylenol  650 mg p.o. every 6 hours as needed mild pain -- Oxycodone  5 mg p.o. every 4 hours as needed moderate pain  -- Dilaudid  0.5 mg IV every 4 hours as needed severe pain -- PT recommending home health, 3 and 1 bedside commode, wheelchair; DME ordered -- OT: Evaluation pending -- Plan discharge with home health; likely on Monday if participates with therapy tomorrow -- Follow-up with podiatry 6/26  CKD stage IIIa Baseline creatinine 1.4, creatinine admission 1.69. -- Cr 1.69>1.29>1.37>1.48>1.52 -- Holding lisinopril; likely will discontinue as patient's blood pressure been well-controlled off of this medication -- Avoid nephrotoxins, renally dose all medications -- BMP in am  DM2, uncontrolled with hyperglycemia Hemoglobin A1c 9.2%, poorly controlled.  Home regimen includes Jardiance 25 mg p.o. daily, Semglee  25u [place] BID, Humalog sliding scale -- Diabetic educator following, appreciate assistance -- Semglee  25u [place] daily -- Sensitive SSI for coverage -- CBG's before every meal/at bedtime  PAD s/p angioplasty left posterior tibial artery/10/2023 Currently not on antiplatelet medication due to stopping aspirin  and Plavix  for GI bleed 4/30.  -- Would benefit from restarting aspirin  and Plavix  postoperatively -- Continue atorvastatin  40 mg p.o. daily  Hx ulcerative esophagitis/gastritis Underwent EGD on 12/16/2023 with findings of LA grade D ulcerative esophagitis with no active bleeding, nonbleeding gastritis. -- Protonix  40 mg p.o. BID  Microcytic anemia Hemoglobin 9.0, MCV 79.7.  Anemia panel with iron 24, TIBC 416, ferritin 5, folate 7.5, vitamin B12 370. -- Start ferrous sulfate 325 g p.o. daily -- Would benefit from outpatient follow-up with gastroenterology for screening  colonoscopy  Anxiety/depression --  Seroquel  50 mg p.o. daily   DVT prophylaxis: SCDs Start: 02/02/24 2345    Code Status: Full Code Family Communication: No family present at bedside  Disposition Plan:  Level of care: Med-Surg Status is: Inpatient Remains inpatient appropriate because: OT evaluation, await operative cultures/pathology; anticipate discharge home tomorrow with home health    Consultants:  Podiatry, Dr. Malvin  Procedures:  Partial first ray amputation, I&D, allograft left foot, podiatry Dr. Malvin, 6/18  Antimicrobials:  Zyvox  6/16 - 6/18 Unasyn  4/16 - 6/18 Doxycycline  6/19>> Augmentin  6/19>>   Subjective: Patient seen examined bedside, lying in bed.  Sleeping but easily arousable.  No complaints this morning.  Discussed with patient would like him to work with therapy 1 more time, he states they will be working with him on Monday; before discharging home.  Patient does not know if he has his medications at home, discussed will fill his medications in the hospital pharmacy before discharge likely tomorrow. No other specific questions, concerns or complaints at this time.  Denies headache, no chest pain, no shortness of breath, no abdominal pain, no fever/chills/night sweats, no nausea/vomiting/diarrhea.  No acute events overnight per nursing staff.  Objective: Vitals:   02/07/24 2019 02/08/24 0416 02/08/24 0849 02/08/24 1012  BP: 135/70 (!) 100/52 (!) 100/53   Pulse: 92 69 72   Resp: 16 18 19 16   Temp: 98 F (36.7 C) 98.8 F (37.1 C) 98.4 F (36.9 C)   TempSrc:      SpO2: 100% 100% 100% 98%  Weight:      Height:        Intake/Output Summary (Last 24 hours) at 02/08/2024 1135 Last data filed at 02/08/2024 1116 Gross per 24 hour  Intake 1120 ml  Output 1150 ml  Net -30 ml   Filed Weights   02/02/24 2010 02/04/24 1015 02/04/24 1659  Weight: 81.6 kg 81.6 kg 91.4 kg    Examination:  Physical Exam: GEN: NAD, alert and oriented x 3,  chronically ill appearance, appears older than stated age HEENT: NCAT, PERRL, EOMI, sclera clear, MMM PULM: CTAB w/o wheezes/crackles, normal respiratory effort, on room air CV: RRR w/o M/G/R GI: abd soft, NTND, + BS MSK: no peripheral edema, moves all extremities independently NEURO: No focal neurological deficit PSYCH: normal mood/affect, poor insight Integumentary: Left foot with surgical dressing/Ace wrap in place, clean/dry/intact, otherwise no other concerning rashes/lesions/wounds noted on exposed skin surfaces     Data Reviewed: I have personally reviewed following labs and imaging studies  CBC: Recent Labs  Lab 02/02/24 1830 02/03/24 0527 02/04/24 0511 02/05/24 0527  WBC 9.5 7.3 7.2 9.1  NEUTROABS 6.5  --   --   --   HGB 10.4* 9.4* 9.0* 9.0*  HCT 33.0* 30.0* 29.4* 28.9*  MCV 78.8* 77.7* 79.7* 78.7*  PLT 419* 343 329 335   Basic Metabolic Panel: Recent Labs  Lab 02/02/24 1830 02/03/24 0527 02/04/24 0511 02/05/24 0527 02/08/24 1058  NA 132* 134* 133* 131* 136  K 5.1 4.0 4.0 4.1 4.5  CL 101 104 103 100 103  CO2 20* 20* 22 23 26   GLUCOSE 294* 158* 155* 233* 134*  BUN 34* 29* 25* 22 26*  CREATININE 1.69* 1.29* 1.37* 1.48* 1.52*  CALCIUM  9.8 8.9 8.7* 8.6* 8.7*  MG  --   --   --  2.0 2.1   GFR: Estimated Creatinine Clearance: 54.5 mL/min (A) (by C-G formula based on SCr of 1.52 mg/dL (H)). Liver Function Tests: Recent Labs  Lab 02/02/24 1830  AST 18  ALT 19  ALKPHOS 58  BILITOT 0.4  PROT 7.7  ALBUMIN  3.8   No results for input(s): LIPASE, AMYLASE in the last 168 hours. No results for input(s): AMMONIA in the last 168 hours. Coagulation Profile: No results for input(s): INR, PROTIME in the last 168 hours. Cardiac Enzymes: No results for input(s): CKTOTAL, CKMB, CKMBINDEX, TROPONINI in the last 168 hours. BNP (last 3 results) No results for input(s): PROBNP in the last 8760 hours. HbA1C: No results for input(s): HGBA1C in the  last 72 hours.  CBG: Recent Labs  Lab 02/07/24 1635 02/07/24 2013 02/07/24 2347 02/08/24 0442 02/08/24 0850  GLUCAP 156* 220* 306* 123* 113*   Lipid Profile: No results for input(s): CHOL, HDL, LDLCALC, TRIG, CHOLHDL, LDLDIRECT in the last 72 hours. Thyroid Function Tests: No results for input(s): TSH, T4TOTAL, FREET4, T3FREE, THYROIDAB in the last 72 hours. Anemia Panel: No results for input(s): VITAMINB12, FOLATE, FERRITIN, TIBC, IRON, RETICCTPCT in the last 72 hours.  Sepsis Labs: Recent Labs  Lab 02/02/24 1845 02/02/24 2328  LATICACIDVEN 2.2* 1.6    Recent Results (from the past 240 hours)  Blood Cultures x 2 sites     Status: None   Collection Time: 02/02/24  6:30 PM   Specimen: BLOOD RIGHT FOREARM  Result Value Ref Range Status   Specimen Description BLOOD RIGHT FOREARM  Final   Special Requests   Final    BOTTLES DRAWN AEROBIC AND ANAEROBIC Blood Culture adequate volume   Culture   Final    NO GROWTH 5 DAYS Performed at [facility], [address] [place], [place] [address]    Report Status 02/07/2024 FINAL  Final  Blood Cultures x 2 sites     Status: None   Collection Time: 02/02/24  7:37 PM   Specimen: BLOOD  Result Value Ref Range Status   Specimen Description BLOOD LEFT ANTECUBITAL  Final   Special Requests   Final    BOTTLES DRAWN AEROBIC AND ANAEROBIC Blood Culture adequate volume   Culture   Final    NO GROWTH 5 DAYS Performed at [facility], [address], [place], [place] [address]    Report Status 02/07/2024 FINAL  Final  MRSA Next Gen by PCR, Nasal     Status: None   Collection Time: 02/04/24  6:11 AM   Specimen: Nasal Mucosa; Nasal Swab  Result Value Ref Range Status   MRSA by PCR Next Gen NOT DETECTED NOT DETECTED Final    Comment: (NOTE) The GeneXpert MRSA Assay (FDA approved for NASAL specimens only), is one component of a comprehensive MRSA colonization surveillance program. It is not  intended to diagnose MRSA infection nor to guide or monitor treatment for MRSA infections. Test performance is not FDA approved in patients less than 27 years old. Performed at [facility], [address], [place], [place] [address]   Aerobic/Anaerobic Culture w Gram Stain (surgical/deep wound)     Status: None (Preliminary result)   Collection Time: 02/04/24 11:35 AM   Specimen: Bone; Tissue  Result Value Ref Range Status   Specimen Description BONE  Final   Special Requests NONE  Final   Gram Stain   Final    NO WBC SEEN RARE GRAM POSITIVE COCCI IN PAIRS Performed at [facility], [address] [place], [place] [address]    Culture   Final    RARE STAPHYLOCOCCUS AUREUS RARE PSEUDOMONAS AERUGINOSA NO ANAEROBES ISOLATED; CULTURE IN  PROGRESS FOR 5 DAYS    Report Status PENDING  Incomplete   Organism ID, Bacteria STAPHYLOCOCCUS AUREUS  Final   Organism ID, Bacteria PSEUDOMONAS AERUGINOSA  Final      Susceptibility   Pseudomonas aeruginosa - MIC*    CEFTAZIDIME <=1 SENSITIVE Sensitive     CIPROFLOXACIN  <=0.25 SENSITIVE Sensitive     GENTAMICIN <=1 SENSITIVE Sensitive     IMIPENEM 2 SENSITIVE Sensitive     PIP/TAZO <=4 SENSITIVE Sensitive ug/mL    CEFEPIME 1 SENSITIVE Sensitive     * RARE PSEUDOMONAS AERUGINOSA   Staphylococcus aureus - MIC*    CIPROFLOXACIN  <=0.5 SENSITIVE Sensitive     ERYTHROMYCIN 4 INTERMEDIATE Intermediate     GENTAMICIN <=0.5 SENSITIVE Sensitive     OXACILLIN 0.5 SENSITIVE Sensitive     TETRACYCLINE >=16 RESISTANT Resistant     VANCOMYCIN  1 SENSITIVE Sensitive     TRIMETH /SULFA  <=10 SENSITIVE Sensitive     CLINDAMYCIN <=0.25 SENSITIVE Sensitive     RIFAMPIN <=0.5 SENSITIVE Sensitive     Inducible Clindamycin NEGATIVE Sensitive     LINEZOLID  1 SENSITIVE Sensitive     * RARE STAPHYLOCOCCUS AUREUS         Radiology Studies: No results found.       Scheduled Meds:  atorvastatin   40 mg Oral Daily   ciprofloxacin   500 mg Oral  BID   feeding supplement  237 mL Oral BID BM   insulin  aspart  0-9 Units Subcutaneous Q4H   insulin  glargine-yfgn  20 Units Subcutaneous QHS   linezolid   600 mg Oral Q12H   multivitamin with minerals  1 tablet Oral Daily   pantoprazole   40 mg Oral BID WC   QUEtiapine   50 mg Oral Daily   senna-docusate  1 tablet Oral BID   Continuous Infusions:     LOS: 6 days    Time spent: 48 minutes spent on 02/08/2024 caring for this patient face-to-face including chart review, ordering labs/tests, documenting, discussion with nursing staff, consultants, updating family and interview/physical exam    Camellia PARAS [place], DO [facility] Available via Epic secure chat 7am-7pm After these hours, please refer to coverage provider listed on [email or web address] 02/08/2024, 11:35 AM

## 2024-02-08 NOTE — Evaluation (Signed)
 Occupational Therapy Evaluation Patient Details Name: Jeremiah Burns MRN: [id number] DOB: Oct 18, 1955 Today's Date: 02/08/2024   History of Present Illness   Pt is a 68 y/o M admitted on 02/02/24 after presenting from outpatient podiatry clinic for concern of worsening L foot ulcer & osteomyelitis. 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 PMH: HTN, DM2, PAD, CKD 3A, anxiety/depression     Clinical Impressions PTA, pt reports he was independent with ADL/IADL and functional mobility. Pt received in bed, reluctantly agreeable to OT evaluation. Pt with poor adherence with WB precautions and declined use of post op shoe despite education on orders from MD. Pt stating I can do it all myself with regards to ADL and demonstrated ability to ambulate around the room. Continued to educate pt on importance of use of post op shoe and PWB precautions. Pt declined further OT services during admission. As this is the pt's right, OT will d/c pt from caseload at this time. Please re-consult if necessary. Thank you for referral.      If plan is discharge home, recommend the following:   Supervision due to poor adherence to WB precaution and LLE post op shoe     Functional Status Assessment   Patient has not had a recent decline in their functional status     Equipment Recommendations   None recommended by OT     Recommendations for Other Services         Precautions/Restrictions   Precautions Precautions: Fall Required Braces or Orthoses:  (LLE post op shoe) Restrictions Weight Bearing Restrictions Per Provider Order: Yes LLE Weight Bearing Per Provider Order: Partial weight bearing LLE Partial Weight Bearing Percentage or Pounds: heel weight bearing in post op shoe     Mobility Bed Mobility Overal bed mobility: Modified Independent Bed Mobility: Supine to Sit, Sit to Supine     Supine to sit: Modified independent (Device/Increase time), HOB  elevated, Used rails (extra time, use of bed rails) Sit to supine: Modified independent (Device/Increase time), HOB elevated        Transfers Overall transfer level: Needs assistance Equipment used: None, Straight cane Transfers: Sit to/from Stand Sit to Stand: Modified independent (Device/Increase time)           General transfer comment: sit<>stand from EOB      Balance Overall balance assessment: Needs assistance Sitting-balance support: Feet supported Sitting balance-Leahy Scale: Good     Standing balance support: Single extremity supported, During functional activity Standing balance-Leahy Scale: Fair                             ADL either performed or assessed with clinical judgement   ADL Overall ADL's : Modified independent                                       General ADL Comments: limited formal assessment of ADL completion as pt became appeared to become  frustrated with therapist suggestion to complete ADL and pt stating I can do it myself anticipate he would require some assistance with post op shoe but is otherwise likely at a supervision to modified independent level, supervision for safety and adherence to precautions However pt is with poor adherence despite max cues     Vision         Perception  Praxis         Pertinent Vitals/Pain Pain Assessment Pain Assessment: 0-10 Pain Score: 8  Pain Location: L foot Pain Descriptors / Indicators: Discomfort Pain Intervention(s): Limited activity within patient's tolerance, Monitored during session     Extremity/Trunk Assessment Upper Extremity Assessment Upper Extremity Assessment: Overall WFL for tasks assessed   Lower Extremity Assessment Lower Extremity Assessment: LLE deficits/detail LLE Deficits / Details: LLE ace wrapped   Cervical / Trunk Assessment Cervical / Trunk Assessment: Kyphotic   Communication Communication Communication: No apparent  difficulties   Cognition Arousal: Alert Behavior During Therapy: Flat affect, Agitated Cognition: No family/caregiver present to determine baseline             OT - Cognition Comments: pt was agreeable but appeared agitated with therapist request to get OOB, stating I can do it all myself;poor recall/adherence to [facility] therapist provided reminder and pt scoffed stating thats what I am doing but was evidently putting more weight through his LLE as he was ambulating without use of his cane and adamantly declined putting on his post op shoe                 Following commands: Impaired Following commands impaired: Follows one step commands with increased time, Follows multi-step commands inconsistently     Cueing  General Comments   Cueing Techniques: Verbal cues      Exercises     Shoulder Instructions      Home Living Family/patient expects to be discharged to:: Private residence Living Arrangements: Alone   Type of Home: Mobile home Home Access: Stairs to enter Entrance Stairs-Number of Steps: 2 Entrance Stairs-Rails: Right;Left;Can reach both Home Layout: One level     Bathroom Shower/Tub: Producer, television/film/video: Standard     Home Equipment: Cane - single point;Shower seat - built in;Grab bars - tub/shower;Hand held Programmer, systems (2 wheels)   Additional Comments: recently d/c'd to [facility] on 4/11.      Prior Functioning/Environment Prior Level of Function : Driving             Mobility Comments: Household ambulator with SPC. A couple falls in the past 6 months. ADLs Comments: Independent with bathing & dressing, cooking & cleaning.    OT Problem List: Decreased activity tolerance;Decreased safety awareness;Decreased knowledge of use of DME or AE;Decreased knowledge of precautions;Pain   OT Treatment/Interventions:        OT Goals(Current goals can be found in the care plan section)   Acute Rehab OT Goals Patient  Stated Goal: to go home OT Goal Formulation: With patient Time For Goal Achievement: 02/22/24 Potential to Achieve Goals: Good   OT Frequency:       Co-evaluation              AM-PAC OT 6 Clicks Daily Activity     Outcome Measure Help from another person eating meals?: None Help from another person taking care of personal grooming?: None Help from another person toileting, which includes using toliet, bedpan, or urinal?: None Help from another person bathing (including washing, rinsing, drying)?: None Help from another person to put on and taking off regular upper body clothing?: None Help from another person to put on and taking off regular lower body clothing?: None 6 Click Score: 24   End of Session Nurse Communication: Mobility status  Activity Tolerance: Treatment limited secondary to agitation;Other (comment) (pt declined OT services in the future) Patient left: in bed;with call bell/phone within reach  OT Visit Diagnosis: Other abnormalities of gait and mobility (R26.89);Pain Pain - Right/Left: Left Pain - part of body: Ankle and joints of foot                Time: 9085-9077 OT Time Calculation (min): 8 min Charges:  OT General Charges $OT Visit: 1 Visit OT Evaluation $OT Eval Low Complexity: 1 Low  Abisola Carrero OTR/L Acute Rehabilitation Services Office: [phone]   Verneita ONEIDA Moose 02/08/2024, 11:48 AM

## 2024-02-08 NOTE — Plan of Care (Signed)

## 2024-02-09 ENCOUNTER — Other Ambulatory Visit (HOSPITAL_COMMUNITY): Payer: Self-pay

## 2024-02-09 DIAGNOSIS — L089 Local infection of the skin and subcutaneous tissue, unspecified: Secondary | ICD-10-CM | POA: Diagnosis not present

## 2024-02-09 DIAGNOSIS — E11628 Type 2 diabetes mellitus with other skin complications: Secondary | ICD-10-CM | POA: Diagnosis not present

## 2024-02-09 LAB — GLUCOSE, CAPILLARY
Glucose-Capillary: 109 mg/dL — ABNORMAL HIGH (ref 70–99)
Glucose-Capillary: 128 mg/dL — ABNORMAL HIGH (ref 70–99)
Glucose-Capillary: 141 mg/dL — ABNORMAL HIGH (ref 70–99)
Glucose-Capillary: 162 mg/dL — ABNORMAL HIGH (ref 70–99)

## 2024-02-09 LAB — ZINC: Zinc: 58 ug/dL (ref 44–115)

## 2024-02-09 LAB — AEROBIC/ANAEROBIC CULTURE W GRAM STAIN (SURGICAL/DEEP WOUND): Gram Stain: NONE SEEN

## 2024-02-09 MED ORDER — BLOOD GLUCOSE MONITOR SYSTEM W/DEVICE KIT
1.0000 | PACK | Freq: Three times a day (TID) | 0 refills | Status: AC
  Filled 2024-02-09: qty 1, 30d supply, fill #0

## 2024-02-09 MED ORDER — QUETIAPINE FUMARATE 50 MG PO TABS
50.0000 mg | ORAL_TABLET | Freq: Every day | ORAL | 0 refills | Status: AC
  Filled 2024-02-09: qty 90, 90d supply, fill #0

## 2024-02-09 MED ORDER — LINEZOLID 600 MG PO TABS
600.0000 mg | ORAL_TABLET | Freq: Two times a day (BID) | ORAL | 0 refills | Status: AC
  Filled 2024-02-09: qty 10, 5d supply, fill #0

## 2024-02-09 MED ORDER — ACCU-CHEK SOFTCLIX LANCETS MISC
1.0000 | Freq: Three times a day (TID) | 0 refills | Status: AC
  Filled 2024-02-09: qty 100, 30d supply, fill #0

## 2024-02-09 MED ORDER — INSULIN LISPRO 100 UNIT/ML IJ SOLN
5.0000 [IU] | Freq: Three times a day (TID) | INTRAMUSCULAR | 0 refills | Status: AC
  Filled 2024-02-09: qty 10, 50d supply, fill #0

## 2024-02-09 MED ORDER — FERROUS SULFATE 325 (65 FE) MG PO TABS
325.0000 mg | ORAL_TABLET | Freq: Two times a day (BID) | ORAL | 0 refills | Status: AC
  Filled 2024-02-09: qty 180, 90d supply, fill #0

## 2024-02-09 MED ORDER — JARDIANCE 25 MG PO TABS
25.0000 mg | ORAL_TABLET | Freq: Every day | ORAL | 0 refills | Status: AC
  Filled 2024-02-09: qty 90, 90d supply, fill #0

## 2024-02-09 MED ORDER — OXYCODONE HCL 5 MG PO TABS
5.0000 mg | ORAL_TABLET | Freq: Four times a day (QID) | ORAL | 0 refills | Status: DC | PRN
  Filled 2024-02-09: qty 20, 5d supply, fill #0

## 2024-02-09 MED ORDER — LANTUS SOLOSTAR 100 UNIT/ML ~~LOC~~ SOPN
25.0000 [IU] | PEN_INJECTOR | Freq: Every day | SUBCUTANEOUS | 0 refills | Status: AC
  Filled 2024-02-09: qty 22.5, 90d supply, fill #0

## 2024-02-09 MED ORDER — SENNOSIDES-DOCUSATE SODIUM 8.6-50 MG PO TABS
1.0000 | ORAL_TABLET | Freq: Two times a day (BID) | ORAL | 0 refills | Status: AC
Start: 2024-02-09 — End: 2024-05-09
  Filled 2024-02-09: qty 180, 90d supply, fill #0

## 2024-02-09 MED ORDER — PANTOPRAZOLE SODIUM 40 MG PO TBEC
40.0000 mg | DELAYED_RELEASE_TABLET | Freq: Two times a day (BID) | ORAL | 0 refills | Status: AC
  Filled 2024-02-09: qty 180, 90d supply, fill #0

## 2024-02-09 MED ORDER — LANCET DEVICE MISC
1.0000 | Freq: Three times a day (TID) | 0 refills | Status: AC
Start: 2024-02-09 — End: ?
  Filled 2024-02-09: qty 1, fill #0

## 2024-02-09 MED ORDER — INSULIN SYRINGE-NEEDLE U-100 25G X 5/8" 1 ML MISC
1.0000 | Freq: Three times a day (TID) | 0 refills | Status: AC
  Filled 2024-02-09: qty 100, fill #0

## 2024-02-09 MED ORDER — CIPROFLOXACIN HCL 500 MG PO TABS
500.0000 mg | ORAL_TABLET | Freq: Two times a day (BID) | ORAL | 0 refills | Status: AC
  Filled 2024-02-09: qty 8, 4d supply, fill #0

## 2024-02-09 MED ORDER — BLOOD GLUCOSE TEST VI STRP
1.0000 | ORAL_STRIP | Freq: Three times a day (TID) | 0 refills | Status: AC
  Filled 2024-02-09: qty 100, 30d supply, fill #0

## 2024-02-09 MED ORDER — ATORVASTATIN CALCIUM 40 MG PO TABS
40.0000 mg | ORAL_TABLET | Freq: Every day | ORAL | 0 refills | Status: AC
  Filled 2024-02-09: qty 90, 90d supply, fill #0

## 2024-02-09 NOTE — TOC Transition Note (Signed)
 Transition of Care [facility]) - Discharge Note   Patient Details  Name: Jeremiah Burns MRN: [id number] Date of Birth: [birth year]  [facility]) CM/SW Contact:  Tom-Johnson, Harvest Muskrat, RN Phone Number: 02/09/2024, 1:52 PM   Clinical Narrative:     Patient is scheduled for discharge today.  Readmission Risk Assessment done. Home health info, hospital f/u and discharge instructions on AVS. Prescriptions sent to [facility] pharmacy and patient will receive meds prior discharge. Rollator ordered from [employer] and will be delivered to patient at bedside.  Patient states he drove self to the ED and will drive self home at discharge. CM educated patient on recent sx to Lt foot, patient states he is aware and will drive home.    No further TOC needs noted.       Final next level of care: Home w Home Health Services Barriers to Discharge: Barriers Resolved   Patient Goals and CMS Choice Patient states their goals for this hospitalization and ongoing recovery are:: To return home CMS Medicare.gov Compare Post Acute Care list provided to:: Patient Choice offered to / list presented to : Patient      Discharge Placement                Patient to be transferred to facility by: Self      Discharge Plan and Services Additional resources added to the After Visit Summary for                  DME Arranged: Walker rolling with seat DME Agency: [employer] Date DME Agency Contacted: 02/09/24 Time DME Agency Contacted: 1115 Representative spoke with at DME Agency: [facility] HH Arranged: PT, OT, RN, Nurse's Aide, Social Work [employer] Agency: [facility] Date [facility] Agency Contacted: 02/09/24 Time HH Agency Contacted: 1130 Representative spoke with at [facility] Agency: [place]  Social Drivers of Health (SDOH) Interventions SDOH Screenings   Food Insecurity: No Food Insecurity (02/03/2024)  Housing: Low Risk  (02/03/2024)  Transportation Needs: No Transportation Needs (02/04/2024)   Utilities: Not At Risk (02/04/2024)  Recent Concern: Utilities - At Risk (11/19/2023)  Financial Resource Strain: Not at Risk (08/07/2023)   Received from [facility]  Physical Activity: At Risk (08/07/2023)   Received from [facility]  Social Connections: Moderately Isolated (02/03/2024)  Stress: Not at Risk (08/07/2023)   Received from [facility]  Tobacco Use: Low Risk  (02/02/2024)     Readmission Risk Interventions    02/03/2024    3:35 PM  Readmission Risk Prevention Plan  Transportation Screening Complete  PCP or Specialist Appt within 5-7 Days Complete  Home Care Screening Complete  Medication Review (RN CM) Referral to Pharmacy

## 2024-02-09 NOTE — Discharge Summary (Signed)
 Physician Discharge Summary  Jeremiah Burns FMW:[id number] DOB: February 08, 1956 DOA: 02/02/2024  PCP: [place], Sarah T, MD  Admit date: 02/02/2024 Discharge date: 02/09/2024  Admitted From: Home Disposition: Home, refuse SNF placement  Recommendations for Outpatient Follow-up:  Follow up with PCP in 1-2 weeks Follow-up with podiatry on 02/12/2024 as scheduled; continue minimal weightbearing in postop shoe to heel for transfers Would benefit from outpatient referral to gastroenterology for screening colonoscopy  Home Health: PT/OT Equipment/Devices: Rollator, 3 and 1 bedside commode  Discharge Condition: Stable CODE STATUS: Full code Diet recommendation: Heart healthy/consistent carb richer diet  History of present illness:  Jeremiah Burns is a 68 y.o. male with past medical history significant for HTN, DM2, PAD, CKD stage IIIa, anxiety/depression who presented to [facility] ED on 02/02/2024 with worsening of his left foot ulcer.  Patient was seen in outpatient podiatry clinic for routine follow-up, concern for osteomyelitis and was sent to the hospital for further evaluation and management.   Significant events: 4/3: underwent angioplasty of the left posterior tibial artery  4/4: I&D with sesamoidectomy.  Bone biopsy and dermal allograft by podiatry 4/11: Discharge from the hospital on Zyvox  and Bactrim .  EOT 4/25 4/30: hospitalization for hematemesis.  AKI on CKD 3B 6/16: Admit with left foot infection, osteomyelitis, septic arthritis, myositis 6/18: Partial first ray amputation, I&D, allograft left foot, podiatry Dr. [facility] course:  Left foot diabetic infection with osteomyelitis Left first MTP septic arthritis Left foot cellulitis/myositis Patient presenting from podiatry outpatient office with concern for osteomyelitis with worsening of his left foot ulcer.  Left foot x-ray with plantar soft tissue defect, bony changes first metatarsal phalangeal joint consistent with  septic arthritis and or osteomyelitis.  MRI left foot without contrast with soft tissue wound/ulceration inferior first MTP joint with gas adjacent soft tissues consistent with septic arthritis/osteomyelitis; likely reactive diffuse myositis, dorsal cellulitis.  Podiatry was consulted and patient underwent partial first ray amputation, I&D with excisional debridement lateral fifth metatarsal head and application of allograft by Dr. Malvin on 02/04/2024. Minimal weightbearing in postop shoe to heel for transfers. Operative culture with rare Staphylococcus aureus, Pseudomonas; though margins on pathology reported clean.  Podiatry recommended 7-day postoperative course of antibiotics and will continue ciprofloxacin  and linezolid .  Patient refused SNF placement, discharging home with home health. Follow-up with podiatry 6/26   CKD stage IIIa Baseline creatinine 1.4, creatinine admission 1.69.  Creatinine stable, 1.52 at time of discharge.   DM2, uncontrolled with hyperglycemia Hemoglobin A1c 9.2%, poorly controlled.  Continue Jardiance 25 mg p.o. daily, Semglee  25u [place] , Humalog 5 units 3 times daily AC.  Outpatient follow-up with PCP.    PAD s/p angioplasty left posterior tibial artery/10/2023 Currently not on antiplatelet medication due to stopping aspirin  and Plavix  for GI bleed 4/30.  Continue atorvastatin  40 mg p.o. daily   Hx ulcerative esophagitis/gastritis Underwent EGD on 12/16/2023 with findings of LA grade D ulcerative esophagitis with no active bleeding, nonbleeding gastritis. Protonix  40 mg p.o. BID   Microcytic anemia Hemoglobin 9.0, MCV 79.7.  Anemia panel with iron 24, TIBC 416, ferritin 5, folate 7.5, vitamin B12 370. Start ferrous sulfate 325 g p.o. daily. Would benefit from outpatient follow-up with gastroenterology for screening colonoscopy   Anxiety/depression Seroquel  50 mg p.o. daily  Discharge Diagnoses:  Principal Problem:   Diabetic infection of left foot (HCC) Active  Problems:   CKD stage 3a, GFR 45-59 ml/min (HCC)   Insulin  dependent type 2 diabetes mellitus (HCC)   PAD (peripheral artery disease) (  HCC)   Anxiety and depression   Osteomyelitis of toe of left foot (HCC)   Ulcer of left foot with fat layer exposed [facility])    Discharge Instructions  Discharge Instructions     Call MD for:  difficulty breathing, headache or visual disturbances   Complete by: As directed    Call MD for:  extreme fatigue   Complete by: As directed    Call MD for:  persistant dizziness or light-headedness   Complete by: As directed    Call MD for:  persistant nausea and vomiting   Complete by: As directed    Call MD for:  severe uncontrolled pain   Complete by: As directed    Call MD for:  temperature >100.4   Complete by: As directed    Diet - low sodium heart healthy   Complete by: As directed    Increase activity slowly   Complete by: As directed    Leave dressing on - Keep it clean, dry, and intact until clinic visit   Complete by: As directed       Allergies as of 02/09/2024       Reactions   Aspirin  Other (See Comments)   Codeine Other (See Comments)        Medication List     STOP taking these medications    oxyCODONE -acetaminophen  5-325 MG tablet Commonly known as: PERCOCET/ROXICET       TAKE these medications    atorvastatin  40 MG tablet Commonly known as: LIPITOR Take 1 tablet (40 mg total) by mouth daily.   Blood Glucose Monitoring Suppl Devi 1 each by Does not apply route 3 (three) times daily. May dispense any manufacturer covered by patient's insurance.   BLOOD GLUCOSE TEST STRIPS Strp 1 each by Does not apply route 3 (three) times daily. Use as directed to check blood sugar. May dispense any manufacturer covered by patient's insurance and fits patient's device.   ciprofloxacin  500 MG tablet Commonly known as: CIPRO  Take 1 tablet (500 mg total) by mouth 2 (two) times daily for 4 days.   ferrous sulfate 325 (65 FE) MG EC  tablet Take 1 tablet (325 mg total) by mouth 2 (two) times daily.   insulin  lispro 100 UNIT/ML injection Commonly known as: HUMALOG Inject 0.05 mLs (5 Units total) into the skin 4 (four) times daily -  before meals and at bedtime. What changed: how much to take   Insulin  Syringe-Needle U-100 25G X 5/8 1 ML Misc 1 each by Does not apply route 3 (three) times daily. May dispense any manufacturer covered by patient's insurance.   Jardiance 25 MG Tabs tablet Generic drug: empagliflozin Take 1 tablet (25 mg total) by mouth daily.   Lancet Device Misc 1 each by Does not apply route 3 (three) times daily. May dispense any manufacturer covered by patient's insurance.   Lancets Misc 1 each by Does not apply route 3 (three) times daily. Use as directed to check blood sugar. May dispense any manufacturer covered by patient's insurance and fits patient's device.   Lantus  SoloStar 100 UNIT/ML Solostar Pen Generic drug: insulin  glargine Inject 25 Units into the skin at bedtime. What changed: how much to take   linezolid  600 MG tablet Commonly known as: ZYVOX  Take 1 tablet (600 mg total) by mouth every 12 (twelve) hours for 5 days.   oxyCODONE  5 MG immediate release tablet Commonly known as: Oxy IR/ROXICODONE  Take 1 tablet (5 mg total) by mouth every 6 (six) hours  as needed for moderate pain (pain score 4-6).   pantoprazole  40 MG tablet Commonly known as: PROTONIX  Take 1 tablet (40 mg total) by mouth 2 (two) times daily with a meal. What changed:  when to take this additional instructions   QUEtiapine  50 MG tablet Commonly known as: SEROQUEL  Take 1 tablet (50 mg total) by mouth daily. What changed: when to take this   senna-docusate 8.6-50 MG tablet Commonly known as: Senokot-S Take 1 tablet by mouth 2 (two) times daily.               Durable Medical Equipment  (From admission, onward)           Start     Ordered   02/09/24 1135  For home use only DME 4 wheeled  rolling walker with seat  Once       Question:  Patient needs a walker to treat with the following condition  Answer:  Gait instability   02/09/24 1136   02/06/24 1307  For home use only DME 3 n 1  Once        02/06/24 1306              Discharge Care Instructions  (From admission, onward)           Start     Ordered   02/09/24 0000  Leave dressing on - Keep it clean, dry, and intact until clinic visit        02/09/24 1304            Follow-up Information     [place], Sarah T, MD. Schedule an appointment as soon as possible for a visit in 1 week(s).   Specialty: Family Medicine Contact information: [address] [place] [address] [phone]         Malvin Marsa FALCON, DPM. Go on 02/12/2024.   Specialty: Actuary information: [address] [place] [address] [phone]         [facility] Follow up.   Specialty: Home Health Services Why: Someone will call you to schedule first home visit. Contact information: [address] [place] [address] [phone]                Allergies  Allergen Reactions   Aspirin  Other (See Comments)   Codeine Other (See Comments)    Consultations: Podiatry   Procedures/Studies: DG Foot 2 Views Left Result Date: 02/04/2024 CLINICAL DATA:  Postoperative state. EXAM: LEFT FOOT - 2 VIEW COMPARISON:  Left foot radiograph 02/02/2024, 11/21/2023 FINDINGS: Interval amputation of the first digit to the proximal metatarsal shaft. Expected postoperative more distal subcutaneous air and plantar medial forefoot surgical skin staples. Additional surgical skin staples lateral to the fifth metatarsophalangeal joint. Mild interphalangeal joint space narrowing and peripheral osteophytosis. No acute fracture or dislocation. Mild atherosclerotic calcifications. IMPRESSION: Interval amputation of the first digit to the proximal metatarsal shaft with expected  appearance. Electronically Signed   By: Tanda Lyons M.D.   On: 02/04/2024 15:51   MR FOOT LEFT WO CONTRAST Result Date: 02/02/2024 EXAM DESCRIPTION: MR FOOT LEFT WO CONTRAST CLINICAL HISTORY: Eval osteomyelitis 1st ray COMPARISON: None Available. TECHNIQUE: MRI of the foot is performed according to our usual protocol with multiplanar multi sequence imaging. FINDINGS: There is a soft tissue wound/ulceration inferior to the first MTP joint. Moderate phlegmonous changes. Moderate area of susceptibility artifact within the soft tissues probably reflecting gas related to the ulcer. There is moderate  to severe marrow edema as well as cortical destruction to the first metatarsal head and the proximal phalanx. Moderate joint effusion. Findings consistent with septic arthritis/osteomyelitis. The marrow signal is otherwise unremarkable. Moderate likely reactive diffuse myositis. Mild to moderate dorsal subcutaneous edema suggesting cellulitis. The tendons are unremarkable. IMPRESSION: Soft tissue wound/ulceration inferior to the first MTP joint. Probable areas of gas with susceptibility artifact in the adjacent soft tissues. Findings consistent with septic arthritis/osteomyelitis to the first MTP joint as above. Likely reactive diffuse myositis. Suggestion of dorsal cellulitis. Electronically signed by: Reyes Frees MD 02/02/2024 10:45 PM EDT RP Workstation: [id number]   DG Foot Complete Left Result Date: 02/02/2024 CLINICAL DATA:  Foot wound. EXAM: LEFT FOOT - COMPLETE 3 VIEW COMPARISON:  02/02/2024, 4:15 p.m. FINDINGS: Soft tissue defect posterior to the metatarsals. Bony sulfur process first metatarsophalangeal joint could represent sequela of septic arthritis and/or osteomyelitis. Postop change in the proximal first proximal phalanx. No acute traumatic osseous abnormalities. IMPRESSION: Plantar soft tissue defect. Bony changes in the first metatarsophalangeal joint could represent sequela of septic arthritis  and/or osteomyelitis. Electronically Signed   By: Fonda Field M.D.   On: 02/02/2024 18:51   DG Foot Complete Left Result Date: 02/02/2024 Please see detailed radiograph report in office note.    Subjective: Patient seen examined bedside, lying in bed.  Sleeping but easily arousable.  Discharging home today with home health as he is refused SNF placement.  Has podiatry appointment this week on Thursday for follow-up.  Denies headache, no chest pain, no shortness of breath, no abdominal pain, no fever/chills/night sweats, no nausea/vomiting/diarrhea.  No acute events overnight per nursing staff.  Discharge Exam: Vitals:   02/09/24 0814 02/09/24 0911  BP:    Pulse:    Resp: 16 18  Temp:    SpO2:     Vitals:   02/09/24 0405 02/09/24 0749 02/09/24 0814 02/09/24 0911  BP: (!) 97/59 107/62    Pulse: 68 79    Resp: 16 18 16 18   Temp: 98.2 F (36.8 C) 98.3 F (36.8 C)    TempSrc: Oral Oral    SpO2: 99% 98%    Weight:      Height:        Physical Exam: GEN: NAD, alert and oriented x 3, chronically ill appearance, appears older than stated age HEENT: NCAT, PERRL, EOMI, sclera clear, MMM PULM: CTAB w/o wheezes/crackles, normal respiratory effort, on room air CV: RRR w/o M/G/R GI: abd soft, NTND, + BS MSK: no peripheral edema, moves all extremities independently NEURO: No focal neurological deficit PSYCH: normal mood/affect, poor insight Integumentary: Left foot with surgical dressing/Ace wrap in place, clean/dry/intact, otherwise no other concerning rashes/lesions/wounds noted on exposed skin surfaces    The results of significant diagnostics from this hospitalization (including imaging, microbiology, ancillary and laboratory) are listed below for reference.     Microbiology: Recent Results (from the past 240 hours)  Blood Cultures x 2 sites     Status: None   Collection Time: 02/02/24  6:30 PM   Specimen: BLOOD RIGHT FOREARM  Result Value Ref Range Status   Specimen  Description BLOOD RIGHT FOREARM  Final   Special Requests   Final    BOTTLES DRAWN AEROBIC AND ANAEROBIC Blood Culture adequate volume   Culture   Final    NO GROWTH 5 DAYS Performed at [facility], [address] [place], [place] [address]    Report Status 02/07/2024 FINAL  Final  Blood Cultures x 2  sites     Status: None   Collection Time: 02/02/24  7:37 PM   Specimen: BLOOD  Result Value Ref Range Status   Specimen Description BLOOD LEFT ANTECUBITAL  Final   Special Requests   Final    BOTTLES DRAWN AEROBIC AND ANAEROBIC Blood Culture adequate volume   Culture   Final    NO GROWTH 5 DAYS Performed at [facility], [address] [place], [place] [address]    Report Status 02/07/2024 FINAL  Final  MRSA Next Gen by PCR, Nasal     Status: None   Collection Time: 02/04/24  6:11 AM   Specimen: Nasal Mucosa; Nasal Swab  Result Value Ref Range Status   MRSA by PCR Next Gen NOT DETECTED NOT DETECTED Final    Comment: (NOTE) The GeneXpert MRSA Assay (FDA approved for NASAL specimens only), is one component of a comprehensive MRSA colonization surveillance program. It is not intended to diagnose MRSA infection nor to guide or monitor treatment for MRSA infections. Test performance is not FDA approved in patients less than 63 years old. Performed at [facility], [address], [place], [place] [address]   Aerobic/Anaerobic Culture w Gram Stain (surgical/deep wound)     Status: None   Collection Time: 02/04/24 11:35 AM   Specimen: Bone; Tissue  Result Value Ref Range Status   Specimen Description BONE  Final   Special Requests NONE  Final   Gram Stain NO WBC SEEN RARE GRAM POSITIVE COCCI IN PAIRS   Final   Culture   Final    RARE STAPHYLOCOCCUS AUREUS RARE PSEUDOMONAS AERUGINOSA NO ANAEROBES ISOLATED Performed at [facility], [address], [place], [place] [address]    Report Status 02/09/2024 FINAL  Final   Organism ID, Bacteria STAPHYLOCOCCUS  AUREUS  Final   Organism ID, Bacteria PSEUDOMONAS AERUGINOSA  Final      Susceptibility   Pseudomonas aeruginosa - MIC*    CEFTAZIDIME <=1 SENSITIVE Sensitive     CIPROFLOXACIN  <=0.25 SENSITIVE Sensitive     GENTAMICIN <=1 SENSITIVE Sensitive     IMIPENEM 2 SENSITIVE Sensitive     PIP/TAZO <=4 SENSITIVE Sensitive ug/mL    CEFEPIME 1 SENSITIVE Sensitive     * RARE PSEUDOMONAS AERUGINOSA   Staphylococcus aureus - MIC*    CIPROFLOXACIN  <=0.5 SENSITIVE Sensitive     ERYTHROMYCIN 4 INTERMEDIATE Intermediate     GENTAMICIN <=0.5 SENSITIVE Sensitive     OXACILLIN 0.5 SENSITIVE Sensitive     TETRACYCLINE >=16 RESISTANT Resistant     VANCOMYCIN  1 SENSITIVE Sensitive     TRIMETH /SULFA  <=10 SENSITIVE Sensitive     CLINDAMYCIN <=0.25 SENSITIVE Sensitive     RIFAMPIN <=0.5 SENSITIVE Sensitive     Inducible Clindamycin NEGATIVE Sensitive     LINEZOLID  1 SENSITIVE Sensitive     * RARE STAPHYLOCOCCUS AUREUS     Labs: BNP (last 3 results) No results for input(s): BNP in the last 8760 hours. Basic Metabolic Panel: Recent Labs  Lab 02/02/24 1830 02/03/24 0527 02/04/24 0511 02/05/24 0527 02/08/24 1058  NA 132* 134* 133* 131* 136  K 5.1 4.0 4.0 4.1 4.5  CL 101 104 103 100 103  CO2 20* 20* 22 23 26   GLUCOSE 294* 158* 155* 233* 134*  BUN 34* 29* 25* 22 26*  CREATININE 1.69* 1.29* 1.37* 1.48* 1.52*  CALCIUM  9.8 8.9 8.7* 8.6* 8.7*  MG  --   --   --  2.0 2.1   Liver  Function Tests: Recent Labs  Lab 02/02/24 1830  AST 18  ALT 19  ALKPHOS 58  BILITOT 0.4  PROT 7.7  ALBUMIN  3.8   No results for input(s): LIPASE, AMYLASE in the last 168 hours. No results for input(s): AMMONIA in the last 168 hours. CBC: Recent Labs  Lab 02/02/24 1830 02/03/24 0527 02/04/24 0511 02/05/24 0527 02/08/24 1058  WBC 9.5 7.3 7.2 9.1 7.8  NEUTROABS 6.5  --   --   --   --   HGB 10.4* 9.4* 9.0* 9.0* 8.7*  HCT 33.0* 30.0* 29.4* 28.9* 27.6*  MCV 78.8* 77.7* 79.7* 78.7* 78.6*  PLT 419* 343 329  335 288   Cardiac Enzymes: No results for input(s): CKTOTAL, CKMB, CKMBINDEX, TROPONINI in the last 168 hours. BNP: Invalid input(s): POCBNP CBG: Recent Labs  Lab 02/08/24 1927 02/09/24 0015 02/09/24 0400 02/09/24 0748 02/09/24 1133  GLUCAP 184* 141* 128* 109* 162*   D-Dimer No results for input(s): DDIMER in the last 72 hours. Hgb A1c No results for input(s): HGBA1C in the last 72 hours. Lipid Profile No results for input(s): CHOL, HDL, LDLCALC, TRIG, CHOLHDL, LDLDIRECT in the last 72 hours. Thyroid function studies No results for input(s): TSH, T4TOTAL, T3FREE, THYROIDAB in the last 72 hours.  Invalid input(s): FREET3 Anemia work up No results for input(s): VITAMINB12, FOLATE, FERRITIN, TIBC, IRON, RETICCTPCT in the last 72 hours. Urinalysis    Component Value Date/Time   COLORURINE YELLOW 12/14/2023 1104   APPEARANCEUR CLEAR 12/14/2023 1104   LABSPEC 1.021 12/14/2023 1104   PHURINE 5.0 12/14/2023 1104   GLUCOSEU >=500 (A) 12/14/2023 1104   HGBUR NEGATIVE 12/14/2023 1104   BILIRUBINUR NEGATIVE 12/14/2023 1104   KETONESUR NEGATIVE 12/14/2023 1104   PROTEINUR 30 (A) 12/14/2023 1104   NITRITE NEGATIVE 12/14/2023 1104   LEUKOCYTESUR NEGATIVE 12/14/2023 1104   Sepsis Labs Recent Labs  Lab 02/03/24 0527 02/04/24 0511 02/05/24 0527 02/08/24 1058  WBC 7.3 7.2 9.1 7.8   Microbiology Recent Results (from the past 240 hours)  Blood Cultures x 2 sites     Status: None   Collection Time: 02/02/24  6:30 PM   Specimen: BLOOD RIGHT FOREARM  Result Value Ref Range Status   Specimen Description BLOOD RIGHT FOREARM  Final   Special Requests   Final    BOTTLES DRAWN AEROBIC AND ANAEROBIC Blood Culture adequate volume   Culture   Final    NO GROWTH 5 DAYS Performed at [facility], [address], [place], [place] [address]    Report Status 02/07/2024 FINAL  Final  Blood Cultures x 2 sites     Status: None    Collection Time: 02/02/24  7:37 PM   Specimen: BLOOD  Result Value Ref Range Status   Specimen Description BLOOD LEFT ANTECUBITAL  Final   Special Requests   Final    BOTTLES DRAWN AEROBIC AND ANAEROBIC Blood Culture adequate volume   Culture   Final    NO GROWTH 5 DAYS Performed at [facility], [address], [place], [place] [address]    Report Status 02/07/2024 FINAL  Final  MRSA Next Gen by PCR, Nasal     Status: None   Collection Time: 02/04/24  6:11 AM   Specimen: Nasal Mucosa; Nasal Swab  Result Value Ref Range Status   MRSA by PCR Next Gen NOT DETECTED NOT DETECTED Final    Comment: (NOTE) The GeneXpert MRSA Assay (FDA approved for NASAL specimens only), is one component of a  comprehensive MRSA colonization surveillance program. It is not intended to diagnose MRSA infection nor to guide or monitor treatment for MRSA infections. Test performance is not FDA approved in patients less than 16 years old. Performed at [facility], [address] [place], [place] [address]   Aerobic/Anaerobic Culture w Gram Stain (surgical/deep wound)     Status: None   Collection Time: 02/04/24 11:35 AM   Specimen: Bone; Tissue  Result Value Ref Range Status   Specimen Description BONE  Final   Special Requests NONE  Final   Gram Stain NO WBC SEEN RARE GRAM POSITIVE COCCI IN PAIRS   Final   Culture   Final    RARE STAPHYLOCOCCUS AUREUS RARE PSEUDOMONAS AERUGINOSA NO ANAEROBES ISOLATED Performed at [facility], [address], [place], [place] [address]    Report Status 02/09/2024 FINAL  Final   Organism ID, Bacteria STAPHYLOCOCCUS AUREUS  Final   Organism ID, Bacteria PSEUDOMONAS AERUGINOSA  Final      Susceptibility   Pseudomonas aeruginosa - MIC*    CEFTAZIDIME <=1 SENSITIVE Sensitive     CIPROFLOXACIN  <=0.25 SENSITIVE Sensitive     GENTAMICIN <=1 SENSITIVE Sensitive     IMIPENEM 2 SENSITIVE Sensitive     PIP/TAZO <=4 SENSITIVE Sensitive ug/mL    CEFEPIME 1  SENSITIVE Sensitive     * RARE PSEUDOMONAS AERUGINOSA   Staphylococcus aureus - MIC*    CIPROFLOXACIN  <=0.5 SENSITIVE Sensitive     ERYTHROMYCIN 4 INTERMEDIATE Intermediate     GENTAMICIN <=0.5 SENSITIVE Sensitive     OXACILLIN 0.5 SENSITIVE Sensitive     TETRACYCLINE >=16 RESISTANT Resistant     VANCOMYCIN  1 SENSITIVE Sensitive     TRIMETH /SULFA  <=10 SENSITIVE Sensitive     CLINDAMYCIN <=0.25 SENSITIVE Sensitive     RIFAMPIN <=0.5 SENSITIVE Sensitive     Inducible Clindamycin NEGATIVE Sensitive     LINEZOLID  1 SENSITIVE Sensitive     * RARE STAPHYLOCOCCUS AUREUS     Time coordinating discharge: Over 30 minutes  SIGNED:   Camellia PARAS [place], DO  [facility] 02/09/2024, 1:10 PM

## 2024-02-09 NOTE — Progress Notes (Signed)
 Physical Therapy Treatment  Patient Details Name: Jeremiah Burns MRN: [id number] DOB: 1956/05/27 Today's Date: 02/09/2024   History of Present Illness Pt is a 68 y/o M admitted on 02/02/24 after presenting from outpatient podiatry clinic for concern of worsening L foot ulcer & osteomyelitis. 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 PMH: HTN, DM2, PAD, CKD 3A, anxiety/depression    PT Comments  Pt progressing towards physical therapy goals. Pt continues to have difficulty maintaining heel WB only with post-op shoe donned. Pt making minimal corrective changes when cued for maintenance of precautions and sequencing for safety. Pt voicing concerns with returning home alone, stating he has minimal help available in town. Discussed other options for therapy follow up at d/c and pt not interested in any option but returning home. Pt asking for someone to come in and cook meals for him at d/c. PT educated pt on role of HHPT/HHOT. Also explained the difference between a home health aide and private pay aide in typical duties performed. CM updated after session with HH and DME recommendations. Will continue to follow and progress as able per POC.    If plan is discharge home, recommend the following: A little help with walking and/or transfers;A little help with bathing/dressing/bathroom;Assist for transportation;Help with stairs or ramp for entrance;Assistance with cooking/housework;Supervision due to cognitive status   Can travel by private vehicle        Equipment Recommendations  Rollator (4 wheels)    Recommendations for Other Services       Precautions / Restrictions Precautions Precautions: Fall Recall of Precautions/Restrictions: Intact Required Braces or Orthoses: Other Brace Other Brace: LLE post op shoe Restrictions Weight Bearing Restrictions Per Provider Order: Yes LLE Weight Bearing Per Provider Order: Partial weight bearing LLE Partial Weight  Bearing Percentage or Pounds: heel weight bearing in post op shoe     Mobility  Bed Mobility Overal bed mobility: Modified Independent Bed Mobility: Supine to Sit, Sit to Supine     Supine to sit: Modified independent (Device/Increase time), Used rails (use of bed rails required) Sit to supine: Modified independent (Device/Increase time)   General bed mobility comments: No assist required for transition to/from EOB. Pt preferring to stay in sidelying position    Transfers Overall transfer level: Needs assistance Equipment used: Straight cane Transfers: Sit to/from Stand Sit to Stand: Min assist           General transfer comment: Min assist for initial unteadiness/mild LOB upon stand. Pt able to power up to full stand without assist.    Ambulation/Gait Ambulation/Gait assistance: Contact guard assist Gait Distance (Feet): 60 Feet Assistive device: Straight cane Gait Pattern/deviations: Decreased step length - right, Decreased step length - left, Decreased stride length, Trunk flexed Gait velocity: Decreased Gait velocity interpretation: 1.31 - 2.62 ft/sec, indicative of limited community ambulator   General Gait Details: Pt with poor maintenance of weight bearing restrictions. VC's throughout for heel WB only however pt not making corrective changes.   Stairs Stairs: Yes Stairs assistance: Contact guard assist Stair Management: One rail Right, Alternating pattern, Forwards Number of Stairs: 3 General stair comments: VC's for sequencing and general safety. Encouraged step-to pattern 2 WB restrictions however pt alternating step pattern instead.   Wheelchair Mobility     Tilt Bed    Modified Rankin (Stroke Patients Only)       Balance Overall balance assessment: Needs assistance Sitting-balance support: Feet supported Sitting balance-Leahy Scale: Good  Standing balance support: Single extremity supported, During functional activity Standing  balance-Leahy Scale: Fair                              Hotel manager: No apparent difficulties  Cognition Arousal: Alert Behavior During Therapy: Flat affect   PT - Cognitive impairments: No family/caregiver present to determine baseline, Safety/Judgement, Memory                       PT - Cognition Comments: Easily frustrated Following commands: Impaired Following commands impaired: Follows one step commands with increased time, Follows multi-step commands inconsistently    Cueing Cueing Techniques: Verbal cues  Exercises      General Comments        Pertinent Vitals/Pain Pain Assessment Pain Assessment: Faces Faces Pain Scale: Hurts little more Pain Location: L foot Pain Descriptors / Indicators: Operative site guarding, Sore Pain Intervention(s): Limited activity within patient's tolerance, Monitored during session, Repositioned    Home Living                          Prior Function            PT Goals (current goals can now be found in the care plan section) Acute Rehab PT Goals Patient Stated Goal: go home, not rehab PT Goal Formulation: With patient Time For Goal Achievement: 02/20/24 Potential to Achieve Goals: Good Progress towards PT goals: Progressing toward goals    Frequency    Min 2X/week      PT Plan      Co-evaluation              AM-PAC PT 6 Clicks Mobility   Outcome Measure  Help needed turning from your back to your side while in a flat bed without using bedrails?: None Help needed moving from lying on your back to sitting on the side of a flat bed without using bedrails?: None Help needed moving to and from a bed to a chair (including a wheelchair)?: A Little Help needed standing up from a chair using your arms (e.g., wheelchair or bedside chair)?: A Little Help needed to walk in hospital room?: A Little Help needed climbing 3-5 steps with a railing? : A Little 6  Click Score: 20    End of Session Equipment Utilized During Treatment: Gait belt;Other (comment) (LLE post op shoe) Activity Tolerance: Patient limited by fatigue Patient left: in bed;with call bell/phone within reach Nurse Communication: Mobility status PT Visit Diagnosis: Unsteadiness on feet (R26.81);Pain;Difficulty in walking, not elsewhere classified (R26.2);Other abnormalities of gait and mobility (R26.89);Muscle weakness (generalized) (M62.81) Pain - Right/Left: Left Pain - part of body: Ankle and joints of foot     Time: 0927-0942 PT Time Calculation (min) (ACUTE ONLY): 15 min  Charges:    $Gait Training: 8-22 mins PT General Charges $$ ACUTE PT VISIT: 1 Visit                     Leita Sable, PT, DPT Acute Rehabilitation Services Secure Chat Preferred Office: [phone]    Leita JONETTA Sable 02/09/2024, 9:59 AM

## 2024-02-09 NOTE — Evaluation (Signed)
 Occupational Therapy Evaluation Patient Details Name: Jeremiah Burns MRN: [id number] DOB: 1955/09/13 Today's Date: 02/09/2024   History of Present Illness   Pt is a 68 y/o M admitted on 02/02/24 after presenting from outpatient podiatry clinic for concern of worsening L foot ulcer & osteomyelitis. 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 PMH: HTN, DM2, PAD, CKD 3A, anxiety/depression     Clinical Impressions PTA, pt lives alone and typically Modified Independent with ADLs, household IADLs and mobility with cane. Pt presents now fairly close to this reported baseline w/ pt able to manage ADLs/mobility without assistance aside from Min A for post op shoe mgmt. Pt reports plan to avoid wearing post op shoe though OT reinforced importance of protecting operative foot for optimal healing. Pt interested in rollator, able to return demonstrate brake use w/ fair carryover and interested in one for home use. Anticipate no OT needs upon DC.     If plan is discharge home, recommend the following:   Other (comment) (PRN)     Functional Status Assessment   Patient has not had a recent decline in their functional status     Equipment Recommendations   Other (comment) (Rollator)     Recommendations for Other Services         Precautions/Restrictions   Precautions Precautions: Fall Recall of Precautions/Restrictions: Intact Required Braces or Orthoses: Other Brace Other Brace: LLE post op shoe Restrictions Weight Bearing Restrictions Per Provider Order: Yes LLE Weight Bearing Per Provider Order: Partial weight bearing LLE Partial Weight Bearing Percentage or Pounds: heel weight bearing in post op shoe     Mobility Bed Mobility Overal bed mobility: Modified Independent                  Transfers Overall transfer level: Modified independent Equipment used: Rollator (4 wheels) Transfers: Sit to/from Stand Sit to Stand: Modified  independent (Device/Increase time)           General transfer comment: able to stand from bedside with Rollator. initial cues/education on brake mgmt with pt able to follow through      Balance Overall balance assessment: Needs assistance Sitting-balance support: Feet supported Sitting balance-Leahy Scale: Good     Standing balance support: Bilateral upper extremity supported, No upper extremity supported, During functional activity Standing balance-Leahy Scale: Fair                             ADL either performed or assessed with clinical judgement   ADL Overall ADL's : Needs assistance/impaired Eating/Feeding: Independent   Grooming: Modified independent;Standing   Upper Body Bathing: Modified independent   Lower Body Bathing: Modified independent   Upper Body Dressing : Modified independent   Lower Body Dressing: Modified independent;Minimal assistance Lower Body Dressing Details (indicate cue type and reason): Min A to don post op shoe, able to doff shoe sitting EOB. Toilet Transfer: Tree surgeon (4 wheels)   Toileting- Clothing Manipulation and Hygiene: Modified independent;Sit to/from stand;Sitting/lateral lean         General ADL Comments: Discussed LB ADL technqiues, wrapping L foot to prevent getting bandages wet, importance of wearing post op shoe to protect foot and promote healing, discussed use of Rollator for IADLs in the home and importance of brakes. Encouraged elevation of LE when resting in bed or couch at home.     Vision Ability to See in Adequate Light: 0 Adequate Patient Visual  Report: No change from baseline Vision Assessment?: No apparent visual deficits     Perception         Praxis         Pertinent Vitals/Pain Pain Assessment Pain Assessment: Faces Faces Pain Scale: Hurts a little bit Pain Location: L foot Pain Descriptors / Indicators: Operative site guarding, Sore Pain Intervention(s):  Monitored during session     Extremity/Trunk Assessment Upper Extremity Assessment Upper Extremity Assessment: Overall WFL for tasks assessed;Right hand dominant   Lower Extremity Assessment Lower Extremity Assessment: Defer to PT evaluation   Cervical / Trunk Assessment Cervical / Trunk Assessment: Normal   Communication Communication Communication: No apparent difficulties   Cognition Arousal: Alert Behavior During Therapy: WFL for tasks assessed/performed, Flat affect Cognition: No family/caregiver present to determine baseline             OT - Cognition Comments: likely at baseline, pleasant to this OT overall, does have decreased insight into risks of not following through with wearing post op shoe, limiting WB, etc but with education, appeared more receptive                 Following commands: Impaired Following commands impaired: Follows one step commands with increased time, Only follows one step commands consistently     Cueing  General Comments   Cueing Techniques: Verbal cues      Exercises     Shoulder Instructions      Home Living Family/patient expects to be discharged to:: Private residence Living Arrangements: Alone   Type of Home: Mobile home Home Access: Stairs to enter Entrance Stairs-Number of Steps: 2 Entrance Stairs-Rails: Right;Left;Can reach both Home Layout: One level     Bathroom Shower/Tub: Producer, television/film/video: Standard Bathroom Accessibility: Yes How Accessible: Accessible via walker Home Equipment: Cane - single point;Shower seat - built in;Grab bars - tub/shower;Hand held Programmer, systems (2 wheels)          Prior Functioning/Environment Prior Level of Function : Independent/Modified Independent;Driving;History of Falls (last six months)             Mobility Comments: Household ambulator with SPC. A couple falls in the past 6 months. ADLs Comments: Independent with bathing & dressing,  cooking & cleaning.    OT Problem List: Decreased activity tolerance;Decreased safety awareness;Decreased knowledge of use of DME or AE;Decreased knowledge of precautions;Pain   OT Treatment/Interventions: Self-care/ADL training;Therapeutic exercise;Energy conservation;DME and/or AE instruction;Therapeutic activities;Balance training;Patient/family education      OT Goals(Current goals can be found in the care plan section)   Acute Rehab OT Goals Patient Stated Goal: go home today, not have to wear post op shoe OT Goal Formulation: With patient Time For Goal Achievement: 02/22/24 Potential to Achieve Goals: Good   OT Frequency:  Min 2X/week    Co-evaluation              AM-PAC OT 6 Clicks Daily Activity     Outcome Measure Help from another person eating meals?: None Help from another person taking care of personal grooming?: None Help from another person toileting, which includes using toliet, bedpan, or urinal?: None Help from another person bathing (including washing, rinsing, drying)?: None Help from another person to put on and taking off regular upper body clothing?: None Help from another person to put on and taking off regular lower body clothing?: A Little 6 Click Score: 23   End of Session Equipment Utilized During Treatment: Rollator (4 wheels) Nurse Communication: Mobility  status  Activity Tolerance: Patient tolerated treatment well Patient left: in bed;with call bell/phone within reach  OT Visit Diagnosis: Other abnormalities of gait and mobility (R26.89);Pain Pain - Right/Left: Left Pain - part of body: Ankle and joints of foot                Time: 1201-1219 OT Time Calculation (min): 18 min Charges:  OT General Charges $OT Visit: 1 Visit OT Evaluation $OT Eval Low Complexity: 1 Low  Mliss NOVAK, OTR/L Acute Rehab Services Office: [phone]   Mliss Fish 02/09/2024, 1:06 PM

## 2024-02-09 NOTE — Progress Notes (Signed)
 TOC meds, belongings, and paperwork given to patient.

## 2024-02-09 NOTE — Addendum Note (Signed)
 Addended by: LAMOUNT ETHAN CROME on: 02/09/2024 12:18 PM   Modules accepted: Orders

## 2024-02-12 ENCOUNTER — Ambulatory Visit (INDEPENDENT_AMBULATORY_CARE_PROVIDER_SITE_OTHER): Admitting: Podiatry

## 2024-02-12 VITALS — BP 130/78 | HR 98 | Temp 99.1°F

## 2024-02-12 DIAGNOSIS — Z9889 Other specified postprocedural states: Secondary | ICD-10-CM

## 2024-02-12 MED ORDER — OXYCODONE HCL 5 MG PO TABS: 5.0000 mg | ORAL_TABLET | Freq: Four times a day (QID) | ORAL | 0 refills | Status: DC | PRN

## 2024-02-12 NOTE — Progress Notes (Signed)
 Subjective:  Patient ID: Jeremiah Burns, male    DOB: Aug 07, 1956,  MRN: [id number]  Kristin Cermak presents to clinic today for:  Chief Complaint  Patient presents with   Routine Post Op    Denies N/V/F/C/SOB, pain 8/10, pain meds ended yesterday, taking abx, Denies drainage. Bandage in tac, boot brook after dc from hospital   Patient is 1 week postop after undergoing a left first partial ray amputation.  Patient denies fever, chills, night sweats, nausea/vomiting.  Patient denies chest pain or shortness of breath.  Patient denies calf pain.  Patient arrived full weightbearing without any type of shoe or boot in place.  There was no antalgic gait upon arrival while walking in his sock.  When asked where his boot was, he states it broke.  He had not called the office to inform our staff of this change in status.  He notes that he is still taking his antibiotics but does not know the names of them.  He states he is out of the oxycodone  and wants refills.  He states that the foot is very painful at night and he cannot sleep.  Past Medical History:  Diagnosis Date   Diabetes mellitus [facility])     Past Surgical History:  Procedure Laterality Date   ABDOMINAL AORTOGRAM N/A 11/20/2023   Procedure: ABDOMINAL AORTOGRAM;  Surgeon: Gretta Lonni PARAS, MD;  Location: [facility] INVASIVE CV LAB;  Service: Cardiovascular;  Laterality: N/A;   AMPUTATION Left 02/04/2024   Procedure: AMPUTATION, FOOT, RAY;  Surgeon: Malvin Marsa FALCON, DPM;  Location: [facility] OR;  Service: Orthopedics/Podiatry;  Laterality: Left;  Left partial first ray amputation   ESOPHAGOGASTRODUODENOSCOPY N/A 12/14/2023   Procedure: EGD (ESOPHAGOGASTRODUODENOSCOPY);  Surgeon: Rollin Dover, MD;  Location: [facility] ENDOSCOPY;  Service: Gastroenterology;  Laterality: N/A;   ESOPHAGOGASTRODUODENOSCOPY N/A 12/16/2023   Procedure: EGD (ESOPHAGOGASTRODUODENOSCOPY);  Surgeon: Albertus Gordy HERO, MD;  Location: [facility] ENDOSCOPY;  Service: Gastroenterology;   Laterality: N/A;   HEMOSTASIS CLIP PLACEMENT  12/14/2023   Procedure: CONTROL OF HEMORRHAGE, GI TRACT, ENDOSCOPIC, BY CLIPPING OR OVERSEWING;  Surgeon: Rollin Dover, MD;  Location: [facility] ENDOSCOPY;  Service: Gastroenterology;;   LOWER EXTREMITY ANGIOGRAPHY N/A 11/20/2023   Procedure: Lower Extremity Angiography;  Surgeon: Gretta Lonni PARAS, MD;  Location: [facility] INVASIVE CV LAB;  Service: Cardiovascular;  Laterality: N/A;   LOWER EXTREMITY INTERVENTION N/A 11/20/2023   Procedure: LOWER EXTREMITY INTERVENTION;  Surgeon: Gretta Lonni PARAS, MD;  Location: [facility] INVASIVE CV LAB;  Service: Cardiovascular;  Laterality: N/A;   METATARSAL HEAD EXCISION Left 11/21/2023   Procedure: EXCISION, METATARSAL BONE, HEAD;  Surgeon: Malvin Marsa FALCON, DPM;  Location: [facility] OR;  Service: Orthopedics/Podiatry;  Laterality: Left;  I&D, bone biopsy vs 1st met head resction, abx beads, graft application    Allergies  Allergen Reactions   Aspirin  Other (See Comments)   Codeine Other (See Comments)   Objective:  Vitals:   02/12/24 1312  BP: 130/78  Pulse: 98  Temp: 99.1 F (37.3 C)  SpO2: 97%   Physical Examination: There are trace palpable pedal pulses.  There is minimal localized edema to the surgical area.  The incision is well-approximated at the first ray site and the staple/sutures are holding well.  No active drainage is noted.  No clinical signs of infection are seen.  The area which appears to have a graft in place on the lateral fifth metatarsal head appears stable.  There is fibrous tissue noted in a moist base.  No significant maceration.  Staples are in place.  Small area of central necrosis noted which is moist as well.  No exposed bone or tendon.          Latest Ref Rng & Units 02/02/2024   11:28 PM 11/19/2023   12:09 PM  Hemoglobin A1C  Hemoglobin-A1c 4.8 - 5.6 % 9.2  8.9    Assessment/Plan: 1. Post-operative state      Meds ordered this encounter  Medications   oxyCODONE  (OXY  IR/ROXICODONE ) 5 MG immediate release tablet    Sig: Take 1 tablet (5 mg total) by mouth every 6 (six) hours as needed for moderate pain (pain score 4-6).    Dispense:  20 tablet    Refill:  0   The incisions were redressed with Adaptic and dry sterile dressing was applied.  Ace wrap applied.  Requested that the patient sign a waiver for new surgical shoe as this will not be covered by his insurance but he refused to sign it.  However, we could not simply allow the patient to walk out of our office postop with no protective gear on his foot so he was given a courtesy surgical shoe today.  Refill on the oxycodone  was sent to his pharmacy.  Patient to continue with rest and elevation of the foot at home.  Stay minimal weightbearing until his follow-up with Dr. Lamount.  Return in about 1 week (around 02/19/2024) for post-op recheck with Dr. Lamount.   Awanda CHARM Imperial, DPM, FACFAS [facility]     [address] [place], [place] [address]                Office [phone]  Fax [phone]

## 2024-02-17 ENCOUNTER — Encounter: Payer: Self-pay | Admitting: Podiatry

## 2024-02-17 ENCOUNTER — Ambulatory Visit (INDEPENDENT_AMBULATORY_CARE_PROVIDER_SITE_OTHER)

## 2024-02-17 ENCOUNTER — Ambulatory Visit (INDEPENDENT_AMBULATORY_CARE_PROVIDER_SITE_OTHER): Admitting: Podiatry

## 2024-02-17 DIAGNOSIS — L97522 Non-pressure chronic ulcer of other part of left foot with fat layer exposed: Secondary | ICD-10-CM | POA: Diagnosis not present

## 2024-02-17 DIAGNOSIS — Z89412 Acquired absence of left great toe: Secondary | ICD-10-CM

## 2024-02-17 NOTE — Progress Notes (Signed)
 Subjective:  Patient ID: Jeremiah Burns, male    DOB: 03-Mar-1956,  MRN: [id number]  Chief Complaint  Patient presents with   Routine Post Op    DOS 02/04/24, Incision looks good, minimal drainage on both sides.  Someday it is painful others not as much. He states he is trying to stay off of it.     DOS: 6/18 Procedure: Left foot partial first ray amputation, lateral forefoot ulceration graft application  68 y.o. male returns for post-op check.  He reports doing well.  Does report some soreness.  Has completed antibiotics.  He presents ambulating in postop shoe.  States that he has been attempting to stay off of it.  He does have rollator at home, it is unsafe clear how much he uses it.  His A1c while in the hospital was 9.2.  Review of Systems: Negative except as noted in the HPI. Denies N/V/F/Ch.  Past Medical History:  Diagnosis Date   Diabetes mellitus (HCC)     Current Outpatient Medications:    Accu-Chek Softclix Lancets lancets, Use as directed 3 (three) times daily. Use as directed to check blood sugar. May dispense any manufacturer covered by patient's insurance and fits patient's device., Disp: 100 each, Rfl: 0   atorvastatin  (LIPITOR) 40 MG tablet, Take 1 tablet (40 mg total) by mouth daily., Disp: 90 tablet, Rfl: 0   Blood Glucose Monitoring Suppl (BLOOD GLUCOSE MONITOR SYSTEM) w/Device KIT, Use as directed 3 (three) times daily. May dispense any manufacturer covered by patient's insurance., Disp: 1 kit, Rfl: 0   ferrous sulfate  325 (65 FE) MG tablet, Take 1 tablet (325 mg total) by mouth 2 (two) times daily., Disp: 180 tablet, Rfl: 0   Glucose Blood (BLOOD GLUCOSE TEST STRIPS) STRP, Use as directed 3 (three) times daily. Use as directed to check blood sugar. May dispense any manufacturer covered by patient's insurance and fits patient's device., Disp: 100 strip, Rfl: 0   insulin  lispro (HUMALOG ) 100 UNIT/ML injection, Inject 0.05 mLs (5 Units total) into the skin 4 (four)  times daily -  before meals and at bedtime., Disp: 18 mL, Rfl: 0   Insulin  Syringe-Needle U-100 25G X 5/8 1 ML MISC, Use 3 (three) times daily. May dispense any manufacturer covered by patient's insurance., Disp: 100 each, Rfl: 0   JARDIANCE  25 MG TABS tablet, Take 1 tablet (25 mg total) by mouth daily., Disp: 90 tablet, Rfl: 0   Lancet Device MISC, Use as directed 3 (three) times daily. May dispense any manufacturer covered by patient's insurance., Disp: 1 each, Rfl: 0   LANTUS  SOLOSTAR 100 UNIT/ML Solostar Pen, Inject 25 Units into the skin at bedtime., Disp: 22.5 mL, Rfl: 0   oxyCODONE  (OXY IR/ROXICODONE ) 5 MG immediate release tablet, Take 1 tablet (5 mg total) by mouth every 6 (six) hours as needed for moderate pain (pain score 4-6)., Disp: 20 tablet, Rfl: 0   pantoprazole  (PROTONIX ) 40 MG tablet, Take 1 tablet (40 mg total) by mouth 2 (two) times daily with a meal., Disp: 180 tablet, Rfl: 0   QUEtiapine  (SEROQUEL ) 50 MG tablet, Take 1 tablet (50 mg total) by mouth daily., Disp: 90 tablet, Rfl: 0   senna-docusate (SENOKOT-S) 8.6-50 MG tablet, Take 1 tablet by mouth 2 (two) times daily., Disp: 180 tablet, Rfl: 0  Social History   Tobacco Use  Smoking Status Never  Smokeless Tobacco Never    Allergies  Allergen Reactions   Aspirin  Other (See Comments)   Codeine Other (See Comments)  Objective:  There were no vitals filed for this visit. There is no height or weight on file to calculate BMI. Constitutional Well developed. Well nourished.  Vascular Foot warm and well perfused. Capillary refill normal to all digits.   Neurologic Normal speech. Oriented to person, place, and time. Epicritic sensation to light touch grossly present bilaterally.  Dermatologic Sutures and staples intact to first ray amputation site without signs of dehiscence or gapping.  Skin healing well without signs of infection. Skin edges well coapted without signs of infection. Left lateral fifth metatarsal head  ulceration there is graft that appears to be incorporating well.  Staples intact to this.  Orthopedic: Tenderness to palpation noted about the surgical site.   Radiographs:  left foot 3 views weightbearing 02/17/2024 Interval partial first ray resection.  No signs of osteolysis or bony destruction at amputation site.  Staples are intact to the first ray amputation site into the lateral ulceration site. Assessment:   1. 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 Ulcer of left foot with fat layer exposed (HCC)    Plan:  Patient was evaluated and treated and all questions answered.  S/p foot surgery left partial first ray resection, lateral forefoot ulceration with graft application -Progressing as expected post-operatively. -XR: As described above.  Reviewed with patient. -WB Status: Limited protected weightbearing in surgical shoe with walker assistance -Sutures: Remain in place, may remove at follow-up. -Medications: Pain well-controlled -Foot redressed. - Remove staples from ulceration site today.  Ulceration did not require debriding today  Return in about 1 week (around 02/24/2024) for Post Op Suture Removal/staple.

## 2024-02-24 ENCOUNTER — Ambulatory Visit (INDEPENDENT_AMBULATORY_CARE_PROVIDER_SITE_OTHER): Admitting: Podiatry

## 2024-02-24 VITALS — BP 115/72 | HR 71

## 2024-02-24 DIAGNOSIS — L97522 Non-pressure chronic ulcer of other part of left foot with fat layer exposed: Secondary | ICD-10-CM | POA: Diagnosis not present

## 2024-02-24 DIAGNOSIS — T8130XA Disruption of wound, unspecified, initial encounter: Secondary | ICD-10-CM

## 2024-02-24 DIAGNOSIS — Z4781 Encounter for orthopedic aftercare following surgical amputation: Secondary | ICD-10-CM

## 2024-02-24 NOTE — Progress Notes (Unsigned)
 Subjective:  Patient ID: Jeremiah Burns, male    DOB: May 11, 1956,  MRN: [id number]  Chief Complaint  Patient presents with   Routine Post Op    DOS: 02/04/24, Doing ok, all but 2 staples came out today, one  small area of the incision was still slightly open.     DOS: 02/04/24 Procedure: Left foot partial first ray amputation, lateral forefoot ulceration graft application  68 y.o. male returns for post-op check.  Does report some soreness.  He presents ambulating in postop shoe.  States that he has been attempting to stay off of it.  He does have rollator at home, it is unsafe clear how much he uses it.  Review of Systems: Negative except as noted in the HPI. Denies N/V/F/Ch.  Past Medical History:  Diagnosis Date   Diabetes mellitus (HCC)     Current Outpatient Medications:    Accu-Chek Softclix Lancets lancets, Use as directed 3 (three) times daily. Use as directed to check blood sugar. May dispense any manufacturer covered by patient's insurance and fits patient's device., Disp: 100 each, Rfl: 0   atorvastatin  (LIPITOR) 40 MG tablet, Take 1 tablet (40 mg total) by mouth daily., Disp: 90 tablet, Rfl: 0   Blood Glucose Monitoring Suppl (BLOOD GLUCOSE MONITOR SYSTEM) w/Device KIT, Use as directed 3 (three) times daily. May dispense any manufacturer covered by patient's insurance., Disp: 1 kit, Rfl: 0   ferrous sulfate  325 (65 FE) MG tablet, Take 1 tablet (325 mg total) by mouth 2 (two) times daily., Disp: 180 tablet, Rfl: 0   Glucose Blood (BLOOD GLUCOSE TEST STRIPS) STRP, Use as directed 3 (three) times daily. Use as directed to check blood sugar. May dispense any manufacturer covered by patient's insurance and fits patient's device., Disp: 100 strip, Rfl: 0   insulin  lispro (HUMALOG ) 100 UNIT/ML injection, Inject 0.05 mLs (5 Units total) into the skin 4 (four) times daily -  before meals and at bedtime., Disp: 18 mL, Rfl: 0   Insulin  Syringe-Needle U-100 25G X 5/8 1 ML MISC, Use 3  (three) times daily. May dispense any manufacturer covered by patient's insurance., Disp: 100 each, Rfl: 0   JARDIANCE  25 MG TABS tablet, Take 1 tablet (25 mg total) by mouth daily., Disp: 90 tablet, Rfl: 0   Lancet Device MISC, Use as directed 3 (three) times daily. May dispense any manufacturer covered by patient's insurance., Disp: 1 each, Rfl: 0   LANTUS  SOLOSTAR 100 UNIT/ML Solostar Pen, Inject 25 Units into the skin at bedtime., Disp: 22.5 mL, Rfl: 0   oxyCODONE  (OXY IR/ROXICODONE ) 5 MG immediate release tablet, Take 1 tablet (5 mg total) by mouth every 6 (six) hours as needed for moderate pain (pain score 4-6)., Disp: 20 tablet, Rfl: 0   pantoprazole  (PROTONIX ) 40 MG tablet, Take 1 tablet (40 mg total) by mouth 2 (two) times daily with a meal., Disp: 180 tablet, Rfl: 0   QUEtiapine  (SEROQUEL ) 50 MG tablet, Take 1 tablet (50 mg total) by mouth daily., Disp: 90 tablet, Rfl: 0   senna-docusate (SENOKOT-S) 8.6-50 MG tablet, Take 1 tablet by mouth 2 (two) times daily., Disp: 180 tablet, Rfl: 0  Social History   Tobacco Use  Smoking Status Never  Smokeless Tobacco Never    Allergies  Allergen Reactions   Aspirin  Other (See Comments)   Codeine Other (See Comments)   Objective:   Vitals:   02/24/24 1302 02/24/24 1303  BP: 119/70 115/72  Pulse:    SpO2:  There is no height or weight on file to calculate BMI. Constitutional Well developed. Well nourished.  Vascular Foot warm and well perfused. Capillary refill normal to all digits.   Neurologic Normal speech. Oriented to person, place, and time. Epicritic sensation to light touch grossly present bilaterally.  Dermatologic Left foot partial first ray amputation site with sutures and skin staples intact.  Skin appears well-healed with skin edges well coapted however on removal of the staples there is some slight gapping within the proximal aspect of the incision.  Incision in total measures 10 cm with 0.3 cm with side-to-side  gapping within this area with a depth of 0.3 cm.  No significant drainage here.  Left lateral fifth metatarsal head ulceration progressing well with graft appearing well integrated.  Measures 1.5 x 1 x 0.3 cm predebridement.  Did excisionally debride periwound areas of hyperkeratotic tissue and loosely adhered graft, postdebridement measurements 1.7 x 1.2 x 0.3 cm.  Fat layer exposed.  Orthopedic: Tenderness to palpation noted about the surgical site.   Radiographs:  left foot 3 views weightbearing 02/17/2024 Interval partial first ray resection.  No signs of osteolysis or bony destruction at amputation site.  Staples are intact to the first ray amputation site into the lateral ulceration site. Assessment:   1. Wound dehiscence   2. Ulcer of left foot with fat layer exposed (HCC)   3. Encounter for orthopedic aftercare following surgical amputation    Plan:  Patient was evaluated and treated and all questions answered.  S/p foot surgery left partial first ray resection, lateral forefoot ulceration with graft application -Some concern for slight dehiscence of the first ray resection site proximal aspect.  This appears superficial nature. -Majority of sutures and staples were removed, some staples remaining proximal aspect of the incision.  Steri-Strips were applied over the length of the incision. -Left lateral fifth metatarsal head ulceration was debrided of loosely adhered graft and periwound hyperkeratotic tissue to the level of skin and subcutaneous tissue.  Pre and postdebridement measurements as described above.  Vast majority of the graft appears to have taken well.  Dressed with Hydrofera Blue and bordered foam bandage today. -Ordering home dressing supplies for the patient to perform home wound care.  4 x 4 gauze, Kerlix, Ace wrap needed for the incision site, will continue to leave Steri-Strips intact.  Aquacel Ag and foam bandage needed for the left fifth met head ulceration.  Change  bandage every 1 to 2 days. -WB Status: Limited protected weightbearing in surgical shoe with walker assistance  Return in about 1 week (around 03/02/2024) for Post Op Check.    Patient did have episode of lightheadedness leaving the office.  Did initially appear to have low oxygen sats, vital signs were taken and these did normalize.  Did check blood glucose level as well.  Instructed patient to go to the ED if feelings of lightheadedness persist.

## 2024-02-26 ENCOUNTER — Encounter: Payer: Self-pay | Admitting: Podiatry

## 2024-03-02 ENCOUNTER — Ambulatory Visit (INDEPENDENT_AMBULATORY_CARE_PROVIDER_SITE_OTHER): Admitting: Podiatry

## 2024-03-02 ENCOUNTER — Encounter: Payer: Self-pay | Admitting: Podiatry

## 2024-03-02 DIAGNOSIS — T8130XA Disruption of wound, unspecified, initial encounter: Secondary | ICD-10-CM

## 2024-03-02 DIAGNOSIS — L97522 Non-pressure chronic ulcer of other part of left foot with fat layer exposed: Secondary | ICD-10-CM

## 2024-03-02 DIAGNOSIS — Z4781 Encounter for orthopedic aftercare following surgical amputation: Secondary | ICD-10-CM

## 2024-03-02 MED ORDER — OXYCODONE HCL 5 MG PO TABS: 5.0000 mg | ORAL_TABLET | Freq: Four times a day (QID) | ORAL | 0 refills | Status: DC | PRN

## 2024-03-02 NOTE — Progress Notes (Unsigned)
 Subjective:  Patient ID: Jeremiah Burns, male    DOB: 1955/09/28,  MRN: [id number]  Chief Complaint  Patient presents with   Post-op Problem    Wound dehiscence, 1 week follow up -  Post Op Check - DOS: 02/04/24 Procedure: Left foot partial first ray amputation, lateral forefoot ulceration graft application     DOS: 02/04/24 Procedure: Left foot partial first ray amputation, lateral forefoot ulceration graft application  68 y.o. male returns for postoperative care of left foot partial first ray amputation site, lateral forefoot ulceration.  Does report some soreness.  He presents ambulating in postop shoe.  States that he has been attempting to stay off of it.  He does have rollator at home.  Review of Systems: Negative except as noted in the HPI. Denies N/V/F/Ch.  Past Medical History:  Diagnosis Date   Diabetes mellitus (HCC)     Current Outpatient Medications:    Accu-Chek Softclix Lancets lancets, Use as directed 3 (three) times daily. Use as directed to check blood sugar. May dispense any manufacturer covered by patient's insurance and fits patient's device., Disp: 100 each, Rfl: 0   atorvastatin  (LIPITOR) 40 MG tablet, Take 1 tablet (40 mg total) by mouth daily., Disp: 90 tablet, Rfl: 0   Blood Glucose Monitoring Suppl (BLOOD GLUCOSE MONITOR SYSTEM) w/Device KIT, Use as directed 3 (three) times daily. May dispense any manufacturer covered by patient's insurance., Disp: 1 kit, Rfl: 0   ferrous sulfate  325 (65 FE) MG tablet, Take 1 tablet (325 mg total) by mouth 2 (two) times daily., Disp: 180 tablet, Rfl: 0   Glucose Blood (BLOOD GLUCOSE TEST STRIPS) STRP, Use as directed 3 (three) times daily. Use as directed to check blood sugar. May dispense any manufacturer covered by patient's insurance and fits patient's device., Disp: 100 strip, Rfl: 0   insulin  lispro (HUMALOG ) 100 UNIT/ML injection, Inject 0.05 mLs (5 Units total) into the skin 4 (four) times daily -  before meals and at  bedtime., Disp: 18 mL, Rfl: 0   Insulin  Syringe-Needle U-100 25G X 5/8 1 ML MISC, Use 3 (three) times daily. May dispense any manufacturer covered by patient's insurance., Disp: 100 each, Rfl: 0   JARDIANCE  25 MG TABS tablet, Take 1 tablet (25 mg total) by mouth daily., Disp: 90 tablet, Rfl: 0   Lancet Device MISC, Use as directed 3 (three) times daily. May dispense any manufacturer covered by patient's insurance., Disp: 1 each, Rfl: 0   LANTUS  SOLOSTAR 100 UNIT/ML Solostar Pen, Inject 25 Units into the skin at bedtime., Disp: 22.5 mL, Rfl: 0   oxyCODONE  (OXY IR/ROXICODONE ) 5 MG immediate release tablet, Take 1 tablet (5 mg total) by mouth every 6 (six) hours as needed for moderate pain (pain score 4-6)., Disp: 20 tablet, Rfl: 0   pantoprazole  (PROTONIX ) 40 MG tablet, Take 1 tablet (40 mg total) by mouth 2 (two) times daily with a meal., Disp: 180 tablet, Rfl: 0   QUEtiapine  (SEROQUEL ) 50 MG tablet, Take 1 tablet (50 mg total) by mouth daily., Disp: 90 tablet, Rfl: 0   senna-docusate (SENOKOT-S) 8.6-50 MG tablet, Take 1 tablet by mouth 2 (two) times daily., Disp: 180 tablet, Rfl: 0  Social History   Tobacco Use  Smoking Status Never  Smokeless Tobacco Never    Allergies  Allergen Reactions   Aspirin  Other (See Comments)   Codeine Other (See Comments)   Objective:   There were no vitals filed for this visit.  There is no height or weight  on file to calculate BMI. Constitutional Well developed. Well nourished.  Vascular Foot warm and well perfused. Capillary refill normal to all digits.   Neurologic Normal speech. Oriented to person, place, and time. Epicritic sensation to light touch grossly present bilaterally.  Dermatologic Left foot partial first ray amputation site noted with area of gapping slightly proximally and centrally at the plantar aspects.  Incision in total measures 10 cm with 0.3 cm with side-to-side gapping within this area with a depth of 0.3 cm.  No significant  drainage here.  This does appear overall unchanged from previous.  Left lateral fifth metatarsal head ulceration good wound progression noted.  Measuring 1.2 x 0.7 predebridement with hyperkeratotic borders, postdebridement measurements 1.4 x 0.8 x 0.3 cm with fat layer exposed.  No surrounding erythema here.  Orthopedic: Tenderness to palpation noted about the surgical site.   Radiographs:  left foot 3 views weightbearing 02/17/2024 Interval partial first ray resection.  No signs of osteolysis or bony destruction at amputation site.  Staples are intact to the first ray amputation site into the lateral ulceration site. Assessment:   1. Ulcer of left foot with fat layer exposed (HCC)   2. Encounter for orthopedic aftercare following surgical amputation   3. Wound dehiscence    Plan:  Patient was evaluated and treated and all questions answered.  S/p foot surgery left partial first ray resection with delayed healing of incision site - Remaining sutures were left in place today. - Cleanse the incision site with Betadine. - Mastisol applied, Steri-Strips applied - Offloading new padding applied around the incision site - Emphasized need for limited stance and gait. - Short course of oxycodone  sent in for pain.  20 tablets of 5 mg oxycodone  immediate release, may take 1 tablet every 6 hours as needed for pain  Left foot lateral fifth metatarsal head ulceration -Medically necessary debridement of nonviable skin and subcutaneous tissue was performed today using microcurette to patient tolerance.  Pre and postdebridement measurements as described above.  Hemostasis achieved with compression.  Aquacel Ag and bandage applied. - Reviewed home dressing changes and offloading with patient  Return in about 1 week (around 03/09/2024) for Wound Care.  To evaluate the site of dehiscence.

## 2024-03-05 NOTE — Addendum Note (Signed)
 Addended by: LAMOUNT ETHAN CROME on: 03/05/2024 03:04 PM   Modules accepted: Level of Service

## 2024-03-09 ENCOUNTER — Ambulatory Visit (INDEPENDENT_AMBULATORY_CARE_PROVIDER_SITE_OTHER): Admitting: Podiatry

## 2024-03-09 DIAGNOSIS — T8130XA Disruption of wound, unspecified, initial encounter: Secondary | ICD-10-CM

## 2024-03-09 DIAGNOSIS — L97522 Non-pressure chronic ulcer of other part of left foot with fat layer exposed: Secondary | ICD-10-CM

## 2024-03-09 DIAGNOSIS — Z4781 Encounter for orthopedic aftercare following surgical amputation: Secondary | ICD-10-CM

## 2024-03-09 NOTE — Progress Notes (Unsigned)
 Subjective:  Patient ID: Jeremiah Burns, male    DOB: July 04, 1956,  MRN: [id number]  Chief Complaint  Patient presents with   Wound Check    Left foot PO check and wound care. Outer dressings were dirty, inner dressing was ok, with steri strips intact, he is still very tender.     DOS: 02/04/24 Procedure: Left foot partial first ray amputation, lateral forefoot ulceration graft application  68 y.o. male returns for postoperative care of left foot partial first ray amputation site, lateral forefoot ulceration.  Does report some soreness.  He presents ambulating in surgical shoe.  Denies any changes at this point.  Review of Systems: Negative except as noted in the HPI. Denies N/V/F/Ch.  Past Medical History:  Diagnosis Date   Diabetes mellitus (HCC)     Current Outpatient Medications:    Accu-Chek Softclix Lancets lancets, Use as directed 3 (three) times daily. Use as directed to check blood sugar. May dispense any manufacturer covered by patient's insurance and fits patient's device., Disp: 100 each, Rfl: 0   atorvastatin  (LIPITOR) 40 MG tablet, Take 1 tablet (40 mg total) by mouth daily., Disp: 90 tablet, Rfl: 0   Blood Glucose Monitoring Suppl (BLOOD GLUCOSE MONITOR SYSTEM) w/Device KIT, Use as directed 3 (three) times daily. May dispense any manufacturer covered by patient's insurance., Disp: 1 kit, Rfl: 0   ferrous sulfate  325 (65 FE) MG tablet, Take 1 tablet (325 mg total) by mouth 2 (two) times daily., Disp: 180 tablet, Rfl: 0   Glucose Blood (BLOOD GLUCOSE TEST STRIPS) STRP, Use as directed 3 (three) times daily. Use as directed to check blood sugar. May dispense any manufacturer covered by patient's insurance and fits patient's device., Disp: 100 strip, Rfl: 0   insulin  lispro (HUMALOG ) 100 UNIT/ML injection, Inject 0.05 mLs (5 Units total) into the skin 4 (four) times daily -  before meals and at bedtime., Disp: 18 mL, Rfl: 0   Insulin  Syringe-Needle U-100 25G X 5/8 1 ML MISC,  Use 3 (three) times daily. May dispense any manufacturer covered by patient's insurance., Disp: 100 each, Rfl: 0   JARDIANCE  25 MG TABS tablet, Take 1 tablet (25 mg total) by mouth daily., Disp: 90 tablet, Rfl: 0   Lancet Device MISC, Use as directed 3 (three) times daily. May dispense any manufacturer covered by patient's insurance., Disp: 1 each, Rfl: 0   LANTUS  SOLOSTAR 100 UNIT/ML Solostar Pen, Inject 25 Units into the skin at bedtime., Disp: 22.5 mL, Rfl: 0   oxyCODONE  (OXY IR/ROXICODONE ) 5 MG immediate release tablet, Take 1 tablet (5 mg total) by mouth every 6 (six) hours as needed for moderate pain (pain score 4-6)., Disp: 20 tablet, Rfl: 0   pantoprazole  (PROTONIX ) 40 MG tablet, Take 1 tablet (40 mg total) by mouth 2 (two) times daily with a meal., Disp: 180 tablet, Rfl: 0   QUEtiapine  (SEROQUEL ) 50 MG tablet, Take 1 tablet (50 mg total) by mouth daily., Disp: 90 tablet, Rfl: 0   senna-docusate (SENOKOT-S) 8.6-50 MG tablet, Take 1 tablet by mouth 2 (two) times daily., Disp: 180 tablet, Rfl: 0  Social History   Tobacco Use  Smoking Status Never  Smokeless Tobacco Never    Allergies  Allergen Reactions   Aspirin  Other (See Comments)   Codeine Other (See Comments)   Objective:   There were no vitals filed for this visit.  There is no height or weight on file to calculate BMI. Constitutional Well developed. Well nourished.  Vascular Foot warm  and well perfused. Capillary refill normal to all digits.   Neurologic Normal speech. Oriented to person, place, and time. Epicritic sensation to light touch grossly present bilaterally.  Dermatologic Superficial dehiscence of the left first partial first ray resection noted to the central aspect of the incision, plantarly.  No signs of acute bacterial infection.  0.3 mm of gapping, incision is about 10 cm in length in total.  Left lateral fifth metatarsal head ulceration good wound progression noted.  Appears to be granulating in well.   Loosely adhered scab noted measures about 1.3 x 0.7 cm predebridement, postdebridement measures 1.5 x 0.8 x 0.3 cm with good granulation tissue wound bed.  Orthopedic: Tenderness to palpation noted about the surgical site.   Radiographs:  left foot 3 views weightbearing 02/17/2024 Interval partial first ray resection.  No signs of osteolysis or bony destruction at amputation site.  Staples are intact to the first ray amputation site into the lateral ulceration site. Assessment:   1. Ulcer of left foot with fat layer exposed (HCC)   2. Wound dehiscence   3. Encounter for orthopedic aftercare following surgical amputation     Plan:  Patient was evaluated and treated and all questions answered.  S/p foot surgery left partial first ray resection with delayed healing of incision site - Remaining sutures and staples were removed today - Cleansed the incision site with Betadine. - Mastisol applied, Steri-Strips applied - Continuing to offload the area with felt padding - Emphasized need for limited stance and gait. -Improved pain for patient.  No new medication prescribed today.  Left foot lateral fifth metatarsal head ulceration -Medically necessary debridement of nonviable skin and subcutaneous tissue was performed today using microcurette to patient tolerance.  Pre and postdebridement measurements as described above.  Hemostasis achieved with compression.  Aquacel Ag and bandage applied. - Reviewed home dressing changes and offloading with patient  Return in about 1 week (around 03/16/2024) for post op check.

## 2024-03-12 ENCOUNTER — Encounter: Payer: Self-pay | Admitting: Podiatry

## 2024-03-16 ENCOUNTER — Ambulatory Visit (INDEPENDENT_AMBULATORY_CARE_PROVIDER_SITE_OTHER): Admitting: Podiatry

## 2024-03-16 DIAGNOSIS — Z4781 Encounter for orthopedic aftercare following surgical amputation: Secondary | ICD-10-CM

## 2024-03-16 DIAGNOSIS — T8130XA Disruption of wound, unspecified, initial encounter: Secondary | ICD-10-CM

## 2024-03-16 DIAGNOSIS — L97522 Non-pressure chronic ulcer of other part of left foot with fat layer exposed: Secondary | ICD-10-CM | POA: Diagnosis not present

## 2024-03-16 NOTE — Progress Notes (Unsigned)
 Subjective:  Patient ID: Jeremiah Burns, male    DOB: 11-20-1955,  MRN: [id number]  Chief Complaint  Patient presents with   Routine Post Op    Post op with wound check. The op area is not hurting him right now, but the ulcer on the lateral side is painful, open and has dark drainage coming out. There is a smell present.     DOS: 02/04/24 Procedure: Left foot partial first ray amputation, lateral forefoot ulceration graft application  68 y.o. male returns for postoperative care of left foot partial first ray amputation site, lateral forefoot ulceration.  Does report some soreness more so laterally.  He presents ambulating in surgical shoe.  Denies signs of infection.  Review of Systems: Negative except as noted in the HPI. Denies N/V/F/Ch.  Past Medical History:  Diagnosis Date   Diabetes mellitus (HCC)     Current Outpatient Medications:    Accu-Chek Softclix Lancets lancets, Use as directed 3 (three) times daily. Use as directed to check blood sugar. May dispense any manufacturer covered by patient's insurance and fits patient's device., Disp: 100 each, Rfl: 0   atorvastatin  (LIPITOR) 40 MG tablet, Take 1 tablet (40 mg total) by mouth daily., Disp: 90 tablet, Rfl: 0   Blood Glucose Monitoring Suppl (BLOOD GLUCOSE MONITOR SYSTEM) w/Device KIT, Use as directed 3 (three) times daily. May dispense any manufacturer covered by patient's insurance., Disp: 1 kit, Rfl: 0   ferrous sulfate  325 (65 FE) MG tablet, Take 1 tablet (325 mg total) by mouth 2 (two) times daily., Disp: 180 tablet, Rfl: 0   Glucose Blood (BLOOD GLUCOSE TEST STRIPS) STRP, Use as directed 3 (three) times daily. Use as directed to check blood sugar. May dispense any manufacturer covered by patient's insurance and fits patient's device., Disp: 100 strip, Rfl: 0   insulin  lispro (HUMALOG ) 100 UNIT/ML injection, Inject 0.05 mLs (5 Units total) into the skin 4 (four) times daily -  before meals and at bedtime., Disp: 18 mL, Rfl:  0   Insulin  Syringe-Needle U-100 25G X 5/8 1 ML MISC, Use 3 (three) times daily. May dispense any manufacturer covered by patient's insurance., Disp: 100 each, Rfl: 0   JARDIANCE  25 MG TABS tablet, Take 1 tablet (25 mg total) by mouth daily., Disp: 90 tablet, Rfl: 0   Lancet Device MISC, Use as directed 3 (three) times daily. May dispense any manufacturer covered by patient's insurance., Disp: 1 each, Rfl: 0   LANTUS  SOLOSTAR 100 UNIT/ML Solostar Pen, Inject 25 Units into the skin at bedtime., Disp: 22.5 mL, Rfl: 0   oxyCODONE  (OXY IR/ROXICODONE ) 5 MG immediate release tablet, Take 1 tablet (5 mg total) by mouth every 6 (six) hours as needed for moderate pain (pain score 4-6)., Disp: 20 tablet, Rfl: 0   pantoprazole  (PROTONIX ) 40 MG tablet, Take 1 tablet (40 mg total) by mouth 2 (two) times daily with a meal., Disp: 180 tablet, Rfl: 0   QUEtiapine  (SEROQUEL ) 50 MG tablet, Take 1 tablet (50 mg total) by mouth daily., Disp: 90 tablet, Rfl: 0   senna-docusate (SENOKOT-S) 8.6-50 MG tablet, Take 1 tablet by mouth 2 (two) times daily., Disp: 180 tablet, Rfl: 0  Social History   Tobacco Use  Smoking Status Never  Smokeless Tobacco Never    Allergies  Allergen Reactions   Aspirin  Other (See Comments)   Codeine Other (See Comments)   Objective:   There were no vitals filed for this visit.  There is no height or weight on  file to calculate BMI. Constitutional Well developed. Well nourished.  Vascular Foot warm and well perfused. Capillary refill normal to all digits.   Neurologic Normal speech. Oriented to person, place, and time. Epicritic sensation to light touch grossly present bilaterally.  Dermatologic Superficial dehiscence of the left first partial first ray resection noted to the proximal third of the incision, plantarly.  No signs of acute bacterial infection.  0.3 mm of gapping, incision is about 10 cm in length in total.  This does appear to be partial-thickness with dermal tissue  exposed.  Left lateral fifth metatarsal head ulceration good wound progression noted.  Appears to be granulating in well measures 1.3 x 0.6 by 0.3 cm predebridement with fibrotic slough to the wound bed postdebridement measures 1.5 x 0.8 x 0.3 cm with good bleeding viable base  Orthopedic: Tenderness to palpation noted about the surgical site.   Radiographs:  left foot 3 views weightbearing 02/17/2024 Interval partial first ray resection.  No signs of osteolysis or bony destruction at amputation site.  Staples are intact to the first ray amputation site into the lateral ulceration site. Assessment:   No diagnosis found.   Plan:  Patient was evaluated and treated and all questions answered.  S/p foot surgery left partial first ray resection with delayed healing of incision site - Lightly debrided area of proximal dehiscence using dermal curette to bleeding wound bed - Xeroform applied to proximal third of the incision - Light 2 layer bandage applied with dry sterile gauze - Emphasized continued need for limited stance and gait.  Offloading felt padding applied  Left foot lateral fifth metatarsal head ulceration -Medically necessary debridement of nonviable skin and subcutaneous tissue was performed today using microcurette and #15 blade to patient tolerance.  Pre and postdebridement measurements as described above.  Hemostasis achieved with compression.  Aquacel Ag and bandage applied.  Offloading felt padding applied as well - Reviewed home dressing changes and offloading with patient  Instructing patient to change dressing 2-3 times a week, though he will be back in 1 week for continued wound care.  May see if we can make home health arrangements for patient to extend his visits going forward.  Return in about 1 week (around 03/23/2024) for Wound Care.

## 2024-03-18 ENCOUNTER — Encounter: Payer: Self-pay | Admitting: Podiatry

## 2024-03-23 ENCOUNTER — Ambulatory Visit: Admitting: Podiatry

## 2024-03-30 ENCOUNTER — Ambulatory Visit (INDEPENDENT_AMBULATORY_CARE_PROVIDER_SITE_OTHER): Admitting: Podiatry

## 2024-03-30 DIAGNOSIS — L97522 Non-pressure chronic ulcer of other part of left foot with fat layer exposed: Secondary | ICD-10-CM

## 2024-03-30 DIAGNOSIS — Z4781 Encounter for orthopedic aftercare following surgical amputation: Secondary | ICD-10-CM

## 2024-03-30 NOTE — Progress Notes (Unsigned)
 Subjective:  Patient ID: Jeremiah Burns, male    DOB: [birth year],  MRN: [id number]  Chief Complaint  Patient presents with   Wound Check    Left foot, wound check from SX site. States he is having good days and bad days, and not sure if he is having phantom pains. The SX site looks ok, the ulcer on the lateral side is still open and draining,  Skin is macerated,  and the bandage felt wet, shoe feels wet.      DOS: 02/04/24 Procedure: Left foot partial first ray amputation, lateral forefoot ulceration graft application  68 y.o. male returns for postoperative care of left foot partial first ray amputation site, lateral forefoot ulceration.  Does report some soreness more so laterally.  He reports keeping dressing intact since last visit, has been given dressing supplies previously and had some ordered. Dressings are wet today, he says it is from the rain today.  Review of Systems: Negative except as noted in the HPI. Denies N/V/F/Ch.  Past Medical History:  Diagnosis Date   Diabetes mellitus (HCC)     Current Outpatient Medications:    Accu-Chek Softclix Lancets lancets, Use as directed 3 (three) times daily. Use as directed to check blood sugar. May dispense any manufacturer covered by patient's insurance and fits patient's device., Disp: 100 each, Rfl: 0   atorvastatin  (LIPITOR) 40 MG tablet, Take 1 tablet (40 mg total) by mouth daily., Disp: 90 tablet, Rfl: 0   Blood Glucose Monitoring Suppl (BLOOD GLUCOSE MONITOR SYSTEM) w/Device KIT, Use as directed 3 (three) times daily. May dispense any manufacturer covered by patient's insurance., Disp: 1 kit, Rfl: 0   ferrous sulfate  325 (65 FE) MG tablet, Take 1 tablet (325 mg total) by mouth 2 (two) times daily., Disp: 180 tablet, Rfl: 0   Glucose Blood (BLOOD GLUCOSE TEST STRIPS) STRP, Use as directed 3 (three) times daily. Use as directed to check blood sugar. May dispense any manufacturer covered by patient's insurance and fits patient's  device., Disp: 100 strip, Rfl: 0   insulin  lispro (HUMALOG ) 100 UNIT/ML injection, Inject 0.05 mLs (5 Units total) into the skin 4 (four) times daily -  before meals and at bedtime., Disp: 18 mL, Rfl: 0   Insulin  Syringe-Needle U-100 25G X 5/8 1 ML MISC, Use 3 (three) times daily. May dispense any manufacturer covered by patient's insurance., Disp: 100 each, Rfl: 0   JARDIANCE  25 MG TABS tablet, Take 1 tablet (25 mg total) by mouth daily., Disp: 90 tablet, Rfl: 0   Lancet Device MISC, Use as directed 3 (three) times daily. May dispense any manufacturer covered by patient's insurance., Disp: 1 each, Rfl: 0   LANTUS  SOLOSTAR 100 UNIT/ML Solostar Pen, Inject 25 Units into the skin at bedtime., Disp: 22.5 mL, Rfl: 0   pantoprazole  (PROTONIX ) 40 MG tablet, Take 1 tablet (40 mg total) by mouth 2 (two) times daily with a meal., Disp: 180 tablet, Rfl: 0   QUEtiapine  (SEROQUEL ) 50 MG tablet, Take 1 tablet (50 mg total) by mouth daily., Disp: 90 tablet, Rfl: 0   senna-docusate (SENOKOT-S) 8.6-50 MG tablet, Take 1 tablet by mouth 2 (two) times daily., Disp: 180 tablet, Rfl: 0   oxyCODONE  (OXY IR/ROXICODONE ) 5 MG immediate release tablet, Take 1 tablet (5 mg total) by mouth every 6 (six) hours as needed for severe pain (pain score 7-10)., Disp: 15 tablet, Rfl: 0  Social History   Tobacco Use  Smoking Status Never  Smokeless Tobacco Never  Allergies  Allergen Reactions   Aspirin  Other (See Comments)   Codeine Other (See Comments)   Objective:   There were no vitals filed for this visit.  There is no height or weight on file to calculate BMI. Constitutional Well developed. Well nourished.  Vascular Foot warm and well perfused. Capillary refill normal to all digits.   Neurologic Normal speech. Oriented to person, place, and time. Epicritic sensation to light touch grossly present bilaterally.  Dermatologic Left first ray incision progressing well, central area of skin breakdown appears  improved from previous and appears limited to dermal tissue involvement at this point.  Left lateral fifth metatarsal head ulceration appears slightly enlarged measures 1.8 x 0.8 x 0.3 cm today.  Fibrotic slough overlying the wound, underlying tissue appears granular and viable following excisional debridement.  Of note there is significant sloughing and maceration surrounding skin appearing consistent with some fungal skin and having dressings wet for some time.  Orthopedic: Tenderness to palpation noted about the surgical site.    Radiographs:  left foot 3 views weightbearing 02/17/2024 Interval partial first ray resection.  No signs of osteolysis or bony destruction at amputation site.  Staples are intact to the first ray amputation site into the lateral ulceration site. Assessment:   1. Ulcer of left foot with fat layer exposed (HCC)   2. Encounter for orthopedic aftercare following surgical amputation      Plan:  Patient was evaluated and treated and all questions answered.  S/p foot surgery left partial first ray resection with delayed healing of incision site - This area appears to be healing well at this point - Small bandage applied with Silvadene to the central area where there is some skin breakdown - Continue offloading felt padding  Left foot lateral fifth metatarsal head ulceration -Medically necessary debridement of nonviable skin and subcutaneous tissue was performed today using microcurette and #15 blade to patient tolerance.  Pre and postdebridement measurements as described above.  Hemostasis achieved with compression.  Aquacel Ag and bandage applied.  Offloading felt padding applied as well - Reviewed home dressing changes and offloading with patient  Instructing patient to change dressing 2-3 times a week, though he will be back in 1 week for continued wound care.  Monitor for signs of infection  Return in about 2 weeks (around 04/13/2024) for Wound Care.

## 2024-03-31 ENCOUNTER — Other Ambulatory Visit: Payer: Self-pay | Admitting: Podiatry

## 2024-03-31 ENCOUNTER — Telehealth: Payer: Self-pay | Admitting: Urology

## 2024-03-31 DIAGNOSIS — Z4781 Encounter for orthopedic aftercare following surgical amputation: Secondary | ICD-10-CM

## 2024-03-31 MED ORDER — OXYCODONE HCL 5 MG PO TABS: 5.0000 mg | ORAL_TABLET | Freq: Four times a day (QID) | ORAL | 0 refills | Status: DC | PRN

## 2024-03-31 NOTE — Progress Notes (Signed)
Pain medication sent in

## 2024-03-31 NOTE — Telephone Encounter (Signed)
 Pt called wanting to know if he can get a refill on pain medicine.  Pharmacy - [facility] [address] [place] - [address]

## 2024-04-05 ENCOUNTER — Encounter: Payer: Self-pay | Admitting: Podiatry

## 2024-04-06 ENCOUNTER — Ambulatory Visit: Admitting: Podiatry

## 2024-04-13 ENCOUNTER — Ambulatory Visit (INDEPENDENT_AMBULATORY_CARE_PROVIDER_SITE_OTHER): Admitting: Podiatry

## 2024-04-13 ENCOUNTER — Ambulatory Visit (INDEPENDENT_AMBULATORY_CARE_PROVIDER_SITE_OTHER)

## 2024-04-13 DIAGNOSIS — L97523 Non-pressure chronic ulcer of other part of left foot with necrosis of muscle: Secondary | ICD-10-CM | POA: Diagnosis not present

## 2024-04-13 DIAGNOSIS — L03119 Cellulitis of unspecified part of limb: Secondary | ICD-10-CM

## 2024-04-13 DIAGNOSIS — L02619 Cutaneous abscess of unspecified foot: Secondary | ICD-10-CM

## 2024-04-13 MED ORDER — CIPROFLOXACIN HCL 500 MG PO TABS: 500.0000 mg | ORAL_TABLET | Freq: Two times a day (BID) | ORAL | 0 refills | Status: AC

## 2024-04-13 MED ORDER — LINEZOLID 600 MG PO TABS: 600.0000 mg | ORAL_TABLET | Freq: Two times a day (BID) | ORAL | 0 refills | Status: AC

## 2024-04-13 NOTE — Progress Notes (Unsigned)
 Ulcer a bit deeper xreays ok Betadine wet to dry daily

## 2024-04-27 ENCOUNTER — Ambulatory Visit (INDEPENDENT_AMBULATORY_CARE_PROVIDER_SITE_OTHER)

## 2024-04-27 ENCOUNTER — Ambulatory Visit (INDEPENDENT_AMBULATORY_CARE_PROVIDER_SITE_OTHER): Admitting: Podiatry

## 2024-04-27 DIAGNOSIS — L97523 Non-pressure chronic ulcer of other part of left foot with necrosis of muscle: Secondary | ICD-10-CM

## 2024-04-27 DIAGNOSIS — M86172 Other acute osteomyelitis, left ankle and foot: Secondary | ICD-10-CM | POA: Diagnosis not present

## 2024-04-27 DIAGNOSIS — L97524 Non-pressure chronic ulcer of other part of left foot with necrosis of bone: Secondary | ICD-10-CM | POA: Diagnosis not present

## 2024-04-27 MED ORDER — LINEZOLID 600 MG PO TABS: 600.0000 mg | ORAL_TABLET | Freq: Two times a day (BID) | ORAL | 0 refills | Status: AC

## 2024-04-27 NOTE — Progress Notes (Unsigned)
 Subjective:  Patient ID: Jeremiah Burns, male    DOB: [birth year],  MRN: [id number]  68 y.o. male is seen today for evaluation of left lateral fifth metatarsal head ulceration.  Patient has been instructed to change the dressing daily with Betadine wet-to-dry dressings.  He states that he has just been changing with the plain dry gauze.  Outer dressings dirty with debris.  Patient is using Darco Shoe.  Patient relates wound has worsened.  Patient The patient is having no constitutional symptoms, denying fever, chills, anorexia, or weight loss..     Current Outpatient Medications on File Prior to Visit  Medication Sig Dispense Refill   Accu-Chek Softclix Lancets lancets Use as directed 3 (three) times daily. Use as directed to check blood sugar. May dispense any manufacturer covered by patient's insurance and fits patient's device. 100 each 0   atorvastatin  (LIPITOR) 40 MG tablet Take 1 tablet (40 mg total) by mouth daily. 90 tablet 0   Blood Glucose Monitoring Suppl (BLOOD GLUCOSE MONITOR SYSTEM) w/Device KIT Use as directed 3 (three) times daily. May dispense any manufacturer covered by patient's insurance. 1 kit 0   ferrous sulfate  325 (65 FE) MG tablet Take 1 tablet (325 mg total) by mouth 2 (two) times daily. 180 tablet 0   Glucose Blood (BLOOD GLUCOSE TEST STRIPS) STRP Use as directed 3 (three) times daily. Use as directed to check blood sugar. May dispense any manufacturer covered by patient's insurance and fits patient's device. 100 strip 0   insulin  lispro (HUMALOG ) 100 UNIT/ML injection Inject 0.05 mLs (5 Units total) into the skin 4 (four) times daily -  before meals and at bedtime. 18 mL 0   Insulin  Syringe-Needle U-100 25G X 5/8 1 ML MISC Use 3 (three) times daily. May dispense any manufacturer covered by patient's insurance. 100 each 0   JARDIANCE  25 MG TABS tablet Take 1 tablet (25 mg total) by mouth daily. 90 tablet 0   Lancet Device MISC Use as directed 3 (three) times  daily. May dispense any manufacturer covered by patient's insurance. 1 each 0   LANTUS  SOLOSTAR 100 UNIT/ML Solostar Pen Inject 25 Units into the skin at bedtime. 22.5 mL 0   oxyCODONE  (OXY IR/ROXICODONE ) 5 MG immediate release tablet Take 1 tablet (5 mg total) by mouth every 6 (six) hours as needed for severe pain (pain score 7-10). 15 tablet 0   pantoprazole  (PROTONIX ) 40 MG tablet Take 1 tablet (40 mg total) by mouth 2 (two) times daily with a meal. 180 tablet 0   QUEtiapine  (SEROQUEL ) 50 MG tablet Take 1 tablet (50 mg total) by mouth daily. 90 tablet 0   senna-docusate (SENOKOT-S) 8.6-50 MG tablet Take 1 tablet by mouth 2 (two) times daily. 180 tablet 0   No current facility-administered medications on file prior to visit.     Allergies  Allergen Reactions   Aspirin  Other (See Comments)   Codeine Other (See Comments)     Objective:  Physical Exam: There were no vitals filed for this visit.   Jeremiah Burns is a pleasant 68 y.o. Caucasian male in NAD. AAO x 3.  Vascular Examination: Faintly palpable DP and PT pulses left foot.  Capillary refill intact to the digits.  Pedal hair growth sparse.  There is some mild erythema about the forefoot  Dermatological Examination: Left foot first ray amputation site is well-healed.  There is ulceration lateral fifth metatarsal head as described below.  Overlying periwound skin is macerated loosely adhered.  Sloughing.  Wound Location: Left fifth metatarsal head There is a considerable amount of devitalized tissue present in the wound. Predebridement Wound Measurement: 1.1 x 0.4 x 0.5 cm. Postdebridement Wound Measurement: 2.5 x 1 x 0.6 cm. Wound Base: Central area fibrotic with joint capsule involvement, devitalized Peri-wound: Reddened, Macerated Exudate: Scant/small amount Serous exudate Blood Loss during debridement: 1 cc('s). Sign(s) of clinical bacterial infection: erythema, edema, and warmth  Musculoskeletal  Examination: Prior left partial first ray resection.  Neurological Examination: Protective sensation decreased.  Vibratory sensation decreased.  Labs:    Latest Ref Rng & Units 02/02/2024   11:28 PM 11/19/2023   12:09 PM  Hemoglobin A1C  Hemoglobin-A1c 4.8 - 5.6 % 9.2  8.9        Recent Labs    11/21/23 1233 02/04/24 1135  GRAMSTAIN NO WBC SEEN RARE GRAM POSITIVE COCCI IN PAIRS  NO WBC SEEN RARE GRAM POSITIVE COCCI IN PAIRS   LABORGA PROTEUS PENNERI  STAPHYLOCOCCUS SIMULANS STAPHYLOCOCCUS AUREUS  PSEUDOMONAS AERUGINOSA     Lab Results  Component Value Date   WBC 6.5 04/27/2024   HGB 11.1 (L) 04/27/2024   HCT 39.1 04/27/2024   MCV 75 (L) 04/27/2024   PLT 369 04/27/2024     No results found.  Radiographs left foot: 04/27/2024 Bone erosion noted at site of ulceration 5th MPJ left foot.  Prior first ray metatarsal resection noted.  No soft tissue edema.  No gas in soft tissues present.  Ulceration noted over the lateral fifth metatarsal head. No results found.   Assessment:  1. 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  Dispense: 14 tablet; Refill: 0   Plan:  -Orders on today's visit:  Orders Placed This Encounter  Procedures   DG Foot Complete Left    Standing Status:   Future    Number of Occurrences:   1    Expiration Date:   04/27/2025    Reason for Exam (SYMPTOM  OR DIAGNOSIS REQUIRED):   diabetic foot infection, ulcer left 5th met head with necrosis of muscle    Preferred imaging location?:   Internal   CBC with Differential   C-reactive protein   Sedimentation Rate    -Patient was evaluated and  treated and all questions answered.  -Patient educated on diagnosis and treatment plan of routine ulcer debridement/wound care.  -Ulceration debridement achieved utilizing sharp excisional debridement with sterile scalpel blade and sterile currette.. Type/amount of devitalized tissue removed: necrotic tissue nonviable skin, subcutaneous tissue and deep fascia/joint capsule -Today's ulcer size post-debridement: 2.5 x 1 x 0.6 cm. -Ulceration cleansed with wound cleanser. Betadine Solution applied to base of ulceration and secured with light dressing. -Wound responded well to today's debridement. -Patient risk factors affecting healing of ulcer: uncontrolled diabetes, diabetic neuropathy, PAD, CKD, compliance -Discussed wound care instructions at length.  Needs to be applying Betadine wet-to-dry gauze dressing daily, keep site clean and avoid excessive ambulation friction over the area.  Offloading felt padding was applied as well today. -Frequency of debridements needed to achieve healing:  biweekly -Labs ordered today: CBC, CRP, sed rate - Strict return precautions given to patient.  Monitor for signs  of worsening infection.  Contact office immediately or go to the ED if this develops.  # Left fifth metatarsal osteomyelitis -Radiographs reviewed with patient -Labs ordered as described above -Has been on linezolid  and ciprofloxacin . Does seem as he may have only been taking meds once daily, question of compliance. -We discussed surgical intervention for more definitive treatment versus antibiotics with local wound care.  Patient would like to try wound care with antibiotics.  Do think that this is fairly reasonable given that if he has metatarsal head resection he would be at high risk of needing TMA. -Extending course of oral linezolid  for now discussed proper dosing regimen. -Placing order for 2 dose regimen of IV Dalvance home infusion due to the antibiotics and issue of compliance so far.  He has  failed oral antibiotics at this point.  Will plan for 1000 mg IV dose Dalvance followed by a second dose 1 week later of 500 mg. -Complicated by PAD with history of revascularization, poorly controlled diabetes, CKD stage III, compliance issues. -I certify that this diagnosis represents a distinct and separate diagnosis that requires evaluation and treatment separate from other procedures or diagnosis  Return in about 1 week (around 05/04/2024) for Wound Care.  Ethan LITTIE Saddler, DPM      [place] LOCATION: [address] [place] [address]                   Office [phone]   [place] LOCATION: [address], [address] [place], [place] [address] Office [phone]

## 2024-04-28 LAB — CBC WITH DIFFERENTIAL/PLATELET
Basophils Absolute: 0 x10E3/uL (ref 0.0–0.2)
Basos: 1 %
EOS (ABSOLUTE): 0.2 x10E3/uL (ref 0.0–0.4)
Eos: 3 %
Hematocrit: 39.1 % (ref 37.5–51.0)
Hemoglobin: 11.1 g/dL — ABNORMAL LOW (ref 13.0–17.7)
Immature Grans (Abs): 0 x10E3/uL (ref 0.0–0.1)
Immature Granulocytes: 0 %
Lymphocytes Absolute: 1.6 x10E3/uL (ref 0.7–3.1)
Lymphs: 24 %
MCH: 21.3 pg — ABNORMAL LOW (ref 26.6–33.0)
MCHC: 28.4 g/dL — ABNORMAL LOW (ref 31.5–35.7)
MCV: 75 fL — ABNORMAL LOW (ref 79–97)
Monocytes Absolute: 0.9 x10E3/uL (ref 0.1–0.9)
Monocytes: 14 %
Neutrophils Absolute: 3.8 x10E3/uL (ref 1.4–7.0)
Neutrophils: 58 %
Platelets: 369 x10E3/uL (ref 150–450)
RBC: 5.21 x10E6/uL (ref 4.14–5.80)
RDW: 16.9 % — ABNORMAL HIGH (ref 11.6–15.4)
WBC: 6.5 x10E3/uL (ref 3.4–10.8)

## 2024-04-28 LAB — C-REACTIVE PROTEIN: CRP: 9 mg/L (ref 0–10)

## 2024-04-28 LAB — SEDIMENTATION RATE: Sed Rate: 68 mm/h — ABNORMAL HIGH (ref 0–30)

## 2024-04-29 ENCOUNTER — Telehealth: Payer: Self-pay

## 2024-04-29 NOTE — Telephone Encounter (Signed)
 PA request received for dalvance 500 mg solution. I went to submit thorugh CoverMyMeds, but it had already been approved.

## 2024-05-04 ENCOUNTER — Ambulatory Visit (INDEPENDENT_AMBULATORY_CARE_PROVIDER_SITE_OTHER): Admitting: Podiatry

## 2024-05-04 ENCOUNTER — Ambulatory Visit (INDEPENDENT_AMBULATORY_CARE_PROVIDER_SITE_OTHER)

## 2024-05-04 ENCOUNTER — Encounter: Payer: Self-pay | Admitting: Podiatry

## 2024-05-04 DIAGNOSIS — M86172 Other acute osteomyelitis, left ankle and foot: Secondary | ICD-10-CM

## 2024-05-04 DIAGNOSIS — E1165 Type 2 diabetes mellitus with hyperglycemia: Secondary | ICD-10-CM | POA: Diagnosis not present

## 2024-05-04 DIAGNOSIS — L97523 Non-pressure chronic ulcer of other part of left foot with necrosis of muscle: Secondary | ICD-10-CM | POA: Diagnosis not present

## 2024-05-04 DIAGNOSIS — E1140 Type 2 diabetes mellitus with diabetic neuropathy, unspecified: Secondary | ICD-10-CM | POA: Diagnosis not present

## 2024-05-04 NOTE — Patient Instructions (Signed)
 Instructions for Wound Care  The most important step to healing a foot wound is to reduce the pressure on your foot - it is extremely important to stay off your foot as much as possible and wear the shoe/boot as instructed.  Cleanse your foot with saline wash or wound cleanser from the drug store.  Blot dry.  Apply betadine wet to dry guaze dressing to your wound and cover with gauze and a bandage.  May hold bandage in place with Coban (self sticky wrap), Ace bandage or tape.  You may find dressing supplies at your local [employer], [employer], drug store or medical supply store.  Monitor for any signs/symptoms of infection. If there is any increase in redness, red streaks, increase in drainage, warmth to your foot please give us  a call. Also, if you start to run a fever or have flu-like symptoms that can also be a sign of infection. Call the office immediately if any occur or go directly to the emergency room.   If you have any questions, please feel free to give us  a call at [phone] or if you are on MyChart you can always send me a message if needed.

## 2024-05-04 NOTE — Progress Notes (Signed)
 Subjective:  Patient ID: Jeremiah Burns, male    DOB: 10-23-1955,  MRN: [id number]  68 y.o. male is seen today for evaluation of left lateral fifth metatarsal head ulceration.  There has been concern for osteomyelitis.  He has been taking linezolid .  He has home infusions for Dalvance set up which he will start on Thursday.  Prescribed wound care regimen: Betadine Solution dressing daily.  Patient is using Darco Shoe.  Patient relates wound has improved in some ways and worsened in others.  There is similar amounts of drainage, redness to the foot has improved.  Patient The patient is having no constitutional symptoms, denying fever, chills, anorexia, or weight loss..     Current Outpatient Medications on File Prior to Visit  Medication Sig Dispense Refill   Accu-Chek Softclix Lancets lancets Use as directed 3 (three) times daily. Use as directed to check blood sugar. May dispense any manufacturer covered by patient's insurance and fits patient's device. 100 each 0   atorvastatin  (LIPITOR) 40 MG tablet Take 1 tablet (40 mg total) by mouth daily. 90 tablet 0   Blood Glucose Monitoring Suppl (BLOOD GLUCOSE MONITOR SYSTEM) w/Device KIT Use as directed 3 (three) times daily. May dispense any manufacturer covered by patient's insurance. 1 kit 0   ferrous sulfate  325 (65 FE) MG tablet Take 1 tablet (325 mg total) by mouth 2 (two) times daily. 180 tablet 0   Glucose Blood (BLOOD GLUCOSE TEST STRIPS) STRP Use as directed 3 (three) times daily. Use as directed to check blood sugar. May dispense any manufacturer covered by patient's insurance and fits patient's device. 100 strip 0   insulin  lispro (HUMALOG ) 100 UNIT/ML injection Inject 0.05 mLs (5 Units total) into the skin 4 (four) times daily -  before meals and at bedtime. 18 mL 0   Insulin  Syringe-Needle U-100 25G X 5/8 1 ML MISC Use 3 (three) times daily. May dispense any manufacturer covered by patient's insurance. 100 each 0   JARDIANCE  25  MG TABS tablet Take 1 tablet (25 mg total) by mouth daily. 90 tablet 0   Lancet Device MISC Use as directed 3 (three) times daily. May dispense any manufacturer covered by patient's insurance. 1 each 0   LANTUS  SOLOSTAR 100 UNIT/ML Solostar Pen Inject 25 Units into the skin at bedtime. 22.5 mL 0   linezolid  (ZYVOX ) 600 MG tablet Take 1 tablet (600 mg total) by mouth 2 (two) times daily for 7 days. 14 tablet 0   oxyCODONE  (OXY IR/ROXICODONE ) 5 MG immediate release tablet Take 1 tablet (5 mg total) by mouth every 6 (six) hours as needed for severe pain (pain score 7-10). 15 tablet 0   pantoprazole  (PROTONIX ) 40 MG tablet Take 1 tablet (40 mg total) by mouth 2 (two) times daily with a meal. 180 tablet 0   QUEtiapine  (SEROQUEL ) 50 MG tablet Take 1 tablet (50 mg total) by mouth daily. 90 tablet 0   senna-docusate (SENOKOT-S) 8.6-50 MG tablet Take 1 tablet by mouth 2 (two) times daily. 180 tablet 0   No current facility-administered medications on file prior to visit.     Allergies  Allergen Reactions   Aspirin  Other (See Comments)   Codeine Other (See Comments)     Objective:  Physical Exam: There were no vitals filed for this visit.   Jeremiah Burns is a pleasant 68 y.o. Caucasian male in NAD. AAO x 3.  Vascular Examination: Faintly palpable 1/4 DP and PT pulses left foot.  Capillary refill intact to  the digits.  Pedal hair growth sparse.  Erythema about the forefoot is decreased.  No significant edema to the forefoot.  Dermatological Examination: Ulceration left lateral fifth metatarsal head as described below.  Dirt and debris present interdigital aspects of left foot.      Wound Location: Left lateral fifth metatarsal head There is a considerable amount of devitalized tissue present in the wound. Predebridement Wound Measurement: 1 x 1.6 x 0.4 cm. Postdebridement Wound Measurement: 1.2 x 1.8 x 0.5 cm. Wound Base: Mixed Granular/Fibrotic Fibrotic slough Peri-wound: Regular wound  borders.  No significant maceration. Exudate: Scant/small amount yellowish drainage, fibrous slough Blood Loss during debridement: 2 cc('s). Sign(s) of clinical bacterial infection: Yellowish drainage and pain on palpation  Musculoskeletal Examination: Left foot prior partial first ray resection  Neurological Examination: Protective sensation decreased. Vibratory sensation decreased.   Labs:    Latest Ref Rng & Units 02/02/2024   11:28 PM 11/19/2023   12:09 PM  Hemoglobin A1C  Hemoglobin-A1c 4.8 - 5.6 % 9.2  8.9        Recent Labs    11/21/23 1233 02/04/24 1135  GRAMSTAIN NO WBC SEEN RARE GRAM POSITIVE COCCI IN PAIRS  NO WBC SEEN RARE GRAM POSITIVE COCCI IN PAIRS   LABORGA PROTEUS PENNERI  STAPHYLOCOCCUS SIMULANS STAPHYLOCOCCUS AUREUS  PSEUDOMONAS AERUGINOSA     Lab Results  Component Value Date   WBC 6.5 04/27/2024   HGB 11.1 (L) 04/27/2024   HCT 39.1 04/27/2024   MCV 75 (L) 04/27/2024   PLT 369 04/27/2024     No results found.  Radiographs left foot: 05/04/2024 Comparison study 04/27/2024 Ulceration is again visualized lateral aspect of fifth MPJ.  Degree of osseous erosion to the lateral fifth metatarsal head appears stable from previous.  No evidence of soft tissue emphysema noted.  No results found.   Assessment:  1. 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 linezolid , awaiting to start IV Dalvance - DG Foot Complete Left; Future  2. 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 Poorly controlled type 2 diabetes mellitus with neuropathy (HCC) - PAD as well, underwent peripheral angiogram on 4/3   Plan:  -Orders on today's visit:  Orders Placed This Encounter  Procedures   DG Foot Complete Left    Standing Status:   Future    Number of Occurrences:   1    Expiration Date:   05/04/2025    Reason for Exam (SYMPTOM  OR DIAGNOSIS REQUIRED):   osteomyelitis    Preferred imaging location?:   Internal     -Patient was evaluated and treated and all questions answered.  -Patient/POA/Family member educated on diagnosis and treatment plan of routine ulcer debridement/wound care.  -Ulceration debridement achieved utilizing sharp excisional debridement with sterile scalpel blade and sterile currette.. Type/amount of devitalized tissue removed: necrotic tissue, nonviable skin, subcutaneous tissue, muscle fascia -Today's ulcer size post-debridement: 1.2 x 1.8 x 0.5 cm.  Wound margins have contracted somewhat since previous evaluation -Ulceration cleansed with wound cleanser. Betadine Solution applied to base of ulceration and secured with light dressing. -Wound responded well to today's debridement. -Patient risk factors affecting healing of ulcer: uncontrolled diabetes, diabetic neuropathy, PAD, patient compliance -Jeremiah Burns given written instructions on daily wound care for left lateral fifth metatarsal head ulceration. -Continue daily Betadine wet-to-dry gauze applications - Patient on supplies, placing order through [employer]  [facility] -Frequency of debridements needed to achieve healing: weekly to biweekly -Wound culture and  sensitivity ordered today. - Strict return precautions discussed with patient.  Monitor for signs symptoms of worsening infection.  Contact office immediately if this develops. -We have had discussions about surgical intervention versus IV antibiotics and wound care.  Discussed with patient that there is recommendation for surgery that this would likely be more definitive.  He would like to defer surgery and continue with antibiotic therapy.  Has concerns about needing to progress to a TMA. Return in about 2 weeks (around 05/18/2024) for Wound Care.  Jeremiah Burns, DPM      [place] LOCATION: [address], [place] [address]                   Office [phone]   [place] LOCATION: [address],  [address] [place], [place] [address] Office [phone]

## 2024-05-18 ENCOUNTER — Ambulatory Visit (INDEPENDENT_AMBULATORY_CARE_PROVIDER_SITE_OTHER)

## 2024-05-18 ENCOUNTER — Ambulatory Visit (INDEPENDENT_AMBULATORY_CARE_PROVIDER_SITE_OTHER): Admitting: Podiatry

## 2024-05-18 ENCOUNTER — Encounter: Payer: Self-pay | Admitting: Podiatry

## 2024-05-18 ENCOUNTER — Telehealth: Payer: Self-pay | Admitting: Lab

## 2024-05-18 DIAGNOSIS — L97523 Non-pressure chronic ulcer of other part of left foot with necrosis of muscle: Secondary | ICD-10-CM | POA: Diagnosis not present

## 2024-05-18 DIAGNOSIS — L97522 Non-pressure chronic ulcer of other part of left foot with fat layer exposed: Secondary | ICD-10-CM

## 2024-05-18 DIAGNOSIS — M86172 Other acute osteomyelitis, left ankle and foot: Secondary | ICD-10-CM

## 2024-05-18 MED ORDER — LINEZOLID 600 MG PO TABS: 600.0000 mg | ORAL_TABLET | Freq: Two times a day (BID) | ORAL | 0 refills | Status: AC

## 2024-05-18 NOTE — Progress Notes (Signed)
 Chief Complaint  Patient presents with   Wound Check    Left foot lateral ulcer Drainage present, smell present, gauze were stuck to wound. Measurements in cm  1 X 2 X 0.4 A1c     HPI: 68 y.o. male presents today following up for left 5th metatarsal ulceration, osteomyelitis.  At last visit we discussed findings of this and he was refusing surgery.  Due to this, we did proceed with getting Dalvance for the patient, he did have 2 doses of this.  He reports difficulty with daily  dressing changes.  He has been instructed to change dressing daily with Betadine wet-to-dry gauze.  He denies any nausea, vomiting, fever, chills, chest pain.  Does have baseline shortness of breath.  Does  report some soreness about the left fifth metatarsal.  Past Medical History:  Diagnosis Date   Diabetes mellitus [facility])     Past Surgical History:  Procedure Laterality Date   ABDOMINAL AORTOGRAM N/A 11/20/2023   Procedure: ABDOMINAL AORTOGRAM;  Surgeon: Gretta Lonni PARAS, MD;  Location: [facility] INVASIVE CV LAB;  Service: Cardiovascular;  Laterality: N/A;   AMPUTATION Left 02/04/2024   Procedure: AMPUTATION, FOOT, RAY;  Surgeon: Malvin Marsa FALCON, DPM;  Location: [facility] OR;  Service: Orthopedics/Podiatry;  Laterality: Left;  Left partial first ray amputation   ESOPHAGOGASTRODUODENOSCOPY N/A 12/14/2023   Procedure: EGD (ESOPHAGOGASTRODUODENOSCOPY);  Surgeon: Rollin Dover, MD;  Location: [facility] ENDOSCOPY;  Service: Gastroenterology;  Laterality: N/A;   ESOPHAGOGASTRODUODENOSCOPY N/A 12/16/2023   Procedure: EGD (ESOPHAGOGASTRODUODENOSCOPY);  Surgeon: Albertus Gordy HERO, MD;  Location: [facility] ENDOSCOPY;  Service: Gastroenterology;  Laterality: N/A;   HEMOSTASIS CLIP PLACEMENT  12/14/2023   Procedure: CONTROL OF HEMORRHAGE, GI TRACT, ENDOSCOPIC, BY CLIPPING OR OVERSEWING;  Surgeon: Rollin Dover, MD;  Location: [facility] ENDOSCOPY;  Service: Gastroenterology;;   LOWER EXTREMITY ANGIOGRAPHY N/A 11/20/2023   Procedure: Lower  Extremity Angiography;  Surgeon: Gretta Lonni PARAS, MD;  Location[facility] INVASIVE CV LAB;  Service: Cardiovascular;  Laterality: N/A;   LOWER EXTREMITY INTERVENTION N/A 11/20/2023   Procedure: LOWER EXTREMITY INTERVENTION;  Surgeon: Gretta Lonni PARAS, MD;  Location: [facility] INVASIVE CV LAB;  Service: Cardiovascular;  Laterality: N/A;   METATARSAL HEAD EXCISION Left 11/21/2023   Procedure: EXCISION, METATARSAL BONE, HEAD;  Surgeon: Malvin Marsa FALCON, DPM;  Location: [facility] OR;  Service: Orthopedics/Podiatry;  Laterality: Left;  I&D, bone biopsy vs 1st met head resction, abx beads, graft application    Allergies  Allergen Reactions   Aspirin  Other (See Comments)   Codeine Other (See Comments)    ROS see HPI   Physical Exam: There were no vitals filed for this visit.  General: The patient is alert and oriented x3 in no acute distress.  Dermatology: Ulceration present lateral fifth metatarsal head left foot with serosanguineous drainage present.  Significant fibrotic slough to the wound bed.  Wound bed does involve 5th MPJ capsule with some undermining here. No malodor, no warmth increase, no erythema, irregular wound margins predebridement measuring about 1.5 x 0.5 x 0.5 cm loosely adhered hyperkeratotic edges.  Bleeding present on debridement of nonviable skin, subcutaneous tissue, deep fascia, postdebridement measures 2 x 1 x 0.6 cm. 2-3 cc of blood loss during debridement.     Vascular: Faintly palpable DP and PT pedal pulses bilaterally. Capillary refill within normal limits.  Pedal hair growth sparse.  No appreciable erythema.  Decreased erythema from previous.  Neurological: Protective sensation decreased, vibratory sensation decreased.  Musculoskeletal Exam: Prior left foot partial first ray  resection.  Radiographic Exam: Left foot 3 views weightbearing 05/18/2024 Previously noted partial first ray resection.  Resection site appears stable.  No soft tissue emphysema noted today.   Area of osteolysis lateral fifth metatarsal head noted, subtle osteolysis present base of fifth proximal phalanx lateral aspect, this does appear stable from previous.  Findings again concerning for osteomyelitis fifth metatarsal, fifth toe proximal phalanx as well  Assessment/Plan of Care: 1. Acute osteomyelitis of metatarsal bone of left foot (HCC)   2. 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    Dispense:  28 tablet    Refill:  0   None  Discussed clinical findings with patient today.  # Left foot ulceration fifth metatarsal head necrosis of muscle - Wound was cleansed with wound cleanser today - Verbal consent obtained to proceed with excisional debridement of nonviable skin, subcutaneous tissue, muscle fascia using 15 blade, microcurette - Postdebridement measurements 2 x 1 x 0.6 cm - Hemostasis achieved with compression.  Betadine wet-to-dry bandage applied.  Secured in place with secondary dressing with light compression - Continue daily dressing changes - Barriers to wound healing include poorly controlled diabetes A1c of 9.2 from June, PAD, recent intervention in April, prior first ray amputation, CKD 3A, Osteomyelitis currently being treated with antibiotics.  # Left foot osteomyelitis fifth metatarsal, fifth toe Radiographs reviewed with patient.  Explained that I do think that the infection may be suppressed by the 2 doses of Dalvance at this point however given the presentation of the wound and overall vascular status it is unlikely that it is eradicated, and the likelihood achieving this without surgery is poor.  He has been very resistant to surgery at this point.  Today explained to patient that surgery will be more definitive than long-term antibiotics and that we need to start preparing for this. If patient  can be seen by PCP in timely fashion to obtain H&P we may be able to manage in outpatient setting. Otherwise he may need to go to the hospital.  Today ordered CBC, CRP, sed rate to trend from onset of starting oral antibiotics following course of Dalvance.  Prescribing course of linezolid  at this point for continued coverage.    Alizay Bronkema L. Lamount MAUL, AACFAS [facility]     [address] [place], [place] [address]                Office [phone]  Fax [phone]

## 2024-05-18 NOTE — Telephone Encounter (Signed)
 Patient calling to let us  know his PCP will not have an open appointment till November which will be to late for surgery patient would like a phone call to discuss further.

## 2024-05-18 NOTE — Patient Instructions (Signed)
 Instructions for Wound Care  The most important step to healing a foot wound is to reduce the pressure on your foot - it is extremely important to stay off your foot as much as possible and wear the shoe/boot as instructed.  Cleanse your foot with saline wash or warm soapy water (dial antibacterial soap or similar).  Blot dry.  Apply prescribed betadine wet to dry gauze to your wound and cover with gauze and a bandage.  May hold bandage in place with Coban (self sticky wrap), Ace bandage or tape.  You may find dressing supplies at your local [employer], [employer], drug store or medical supply store.  Monitor for any signs/symptoms of infection. If there is any increase in redness, red streaks, increase in drainage, warmth to your foot please give us  a call. Also, if you start to run a fever or have flu-like symptoms that can also be a sign of infection. Call the office immediately if any occur or go directly to the emergency room.   If you have any questions, please feel free to give us  a call at [phone] or if you are on MyChart you can always send me a message if needed.

## 2024-05-19 LAB — CBC WITH DIFFERENTIAL/PLATELET
Basophils Absolute: 0.1 x10E3/uL (ref 0.0–0.2)
Basos: 1 %
EOS (ABSOLUTE): 0.2 x10E3/uL (ref 0.0–0.4)
Eos: 3 %
Hematocrit: 39.5 % (ref 37.5–51.0)
Hemoglobin: 11.8 g/dL — ABNORMAL LOW (ref 13.0–17.7)
Immature Grans (Abs): 0 x10E3/uL (ref 0.0–0.1)
Immature Granulocytes: 0 %
Lymphocytes Absolute: 1.8 x10E3/uL (ref 0.7–3.1)
Lymphs: 26 %
MCH: 22.2 pg — ABNORMAL LOW (ref 26.6–33.0)
MCHC: 29.9 g/dL — ABNORMAL LOW (ref 31.5–35.7)
MCV: 74 fL — ABNORMAL LOW (ref 79–97)
Monocytes Absolute: 0.8 x10E3/uL (ref 0.1–0.9)
Monocytes: 11 %
Neutrophils Absolute: 4.2 x10E3/uL (ref 1.4–7.0)
Neutrophils: 59 %
Platelets: 400 x10E3/uL (ref 150–450)
RBC: 5.32 x10E6/uL (ref 4.14–5.80)
RDW: 18.5 % — ABNORMAL HIGH (ref 11.6–15.4)
WBC: 6.9 x10E3/uL (ref 3.4–10.8)

## 2024-05-19 LAB — SEDIMENTATION RATE: Sed Rate: 62 mm/h — ABNORMAL HIGH (ref 0–30)

## 2024-05-19 LAB — C-REACTIVE PROTEIN: CRP: <1 mg/L (ref 0–10)

## 2024-05-27 ENCOUNTER — Encounter: Payer: Self-pay | Admitting: Podiatry

## 2024-05-27 ENCOUNTER — Ambulatory Visit: Admitting: Podiatry

## 2024-05-27 VITALS — BP 123/80 | HR 99 | Temp 98.2°F

## 2024-05-27 DIAGNOSIS — M86172 Other acute osteomyelitis, left ankle and foot: Secondary | ICD-10-CM

## 2024-05-27 DIAGNOSIS — L97523 Non-pressure chronic ulcer of other part of left foot with necrosis of muscle: Secondary | ICD-10-CM | POA: Diagnosis not present

## 2024-05-27 DIAGNOSIS — E1140 Type 2 diabetes mellitus with diabetic neuropathy, unspecified: Secondary | ICD-10-CM

## 2024-05-27 DIAGNOSIS — E1165 Type 2 diabetes mellitus with hyperglycemia: Secondary | ICD-10-CM

## 2024-05-27 MED ORDER — SULFAMETHOXAZOLE-TRIMETHOPRIM 800-160 MG PO TABS: 1.0000 | ORAL_TABLET | Freq: Two times a day (BID) | ORAL | 0 refills | Status: AC

## 2024-05-27 NOTE — Patient Instructions (Signed)

## 2024-05-27 NOTE — Progress Notes (Signed)
 Chief Complaint  Patient presents with   Wound Check    Left lateral Ulcer. Measures in CM 0.7 X 2 X 0.6 before debridement.  Pain 8/10 today     HPI: 68 y.o. male presents today following up for left 5th metatarsal ulceration, osteomyelitis.  Wound has stayed relatively stable.  He has been changing it daily with Betadine wet-to-dry dressing.  Does come in using postop shoe.  He has had difficulty getting appointment with PCP regarding surgery.  Past Medical History:  Diagnosis Date   Diabetes mellitus [facility])     Past Surgical History:  Procedure Laterality Date   ABDOMINAL AORTOGRAM N/A 11/20/2023   Procedure: ABDOMINAL AORTOGRAM;  Surgeon: Gretta Lonni PARAS, MD;  Location: [facility] INVASIVE CV LAB;  Service: Cardiovascular;  Laterality: N/A;   AMPUTATION Left 02/04/2024   Procedure: AMPUTATION, FOOT, RAY;  Surgeon: Malvin Marsa FALCON, DPM;  Location: [facility] OR;  Service: Orthopedics/Podiatry;  Laterality: Left;  Left partial first ray amputation   ESOPHAGOGASTRODUODENOSCOPY N/A 12/14/2023   Procedure: EGD (ESOPHAGOGASTRODUODENOSCOPY);  Surgeon: Rollin Dover, MD;  Location: [facility] ENDOSCOPY;  Service: Gastroenterology;  Laterality: N/A;   ESOPHAGOGASTRODUODENOSCOPY N/A 12/16/2023   Procedure: EGD (ESOPHAGOGASTRODUODENOSCOPY);  Surgeon: Albertus Gordy HERO, MD;  Location: [facility] ENDOSCOPY;  Service: Gastroenterology;  Laterality: N/A;   HEMOSTASIS CLIP PLACEMENT  12/14/2023   Procedure: CONTROL OF HEMORRHAGE, GI TRACT, ENDOSCOPIC, BY CLIPPING OR OVERSEWING;  Surgeon: Rollin Dover, MD;  Location: [facility] ENDOSCOPY;  Service: Gastroenterology;;   LOWER EXTREMITY ANGIOGRAPHY N/A 11/20/2023   Procedure: Lower Extremity Angiography;  Surgeon: Gretta Lonni PARAS, MD;  Location: [facility] INVASIVE CV LAB;  Service: Cardiovascular;  Laterality: N/A;   LOWER EXTREMITY INTERVENTION N/A 11/20/2023   Procedure: LOWER EXTREMITY INTERVENTION;  Surgeon: Gretta Lonni PARAS, MD;  Location: [facility] INVASIVE CV LAB;   Service: Cardiovascular;  Laterality: N/A;   METATARSAL HEAD EXCISION Left 11/21/2023   Procedure: EXCISION, METATARSAL BONE, HEAD;  Surgeon: Malvin Marsa FALCON, DPM;  Location: [facility] OR;  Service: Orthopedics/Podiatry;  Laterality: Left;  I&D, bone biopsy vs 1st met head resction, abx beads, graft application    Allergies  Allergen Reactions   Aspirin  Other (See Comments)   Codeine Other (See Comments)    ROS see HPI   Physical Exam: Vitals:   05/27/24 1556  BP: 123/80  Pulse: 99  Temp: 98.2 F (36.8 C)  SpO2: 97%    General: The patient is alert and oriented x3 in no acute distress.  Dermatology: Ulceration present lateral fifth metatarsal head left foot with serosanguineous drainage present.  Significant nonviable fibrotic tissue and slough to the wound bed.  Wound bed does involve 5th MPJ capsule with some undermining here. No malodor, no warmth increase, no erythema, irregular wound margins predebridement measuring about 2 x 0.7 x 0.5 cm loosely adhered hyperkeratotic edges.  Bleeding present on debridement of nonviable skin, subcutaneous tissue, deep fascia, postdebridement measures 2.2 x 1 x 0.6 cm. 2-3 cc of blood loss during debridement.   Vascular: Faintly palpable DP and PT pedal pulses bilaterally. Capillary refill within normal limits.  Pedal hair growth sparse.  No appreciable erythema.  Decreased erythema from previous.  Neurological: Protective sensation decreased, vibratory sensation decreased.  Musculoskeletal Exam: Prior left foot partial first ray resection.  Osteomyelitis of left fifth metatarsal head.    Assessment/Plan of Care: 1. Acute osteomyelitis of metatarsal bone of left foot (HCC)   2. Poorly controlled type 2 diabetes mellitus with neuropathy (HCC)   3. Ulcer  of left foot with necrosis of muscle (HCC)      Meds ordered this encounter  Medications   sulfamethoxazole -trimethoprim  (BACTRIM  DS) 800-160 MG tablet    Sig: Take 1 tablet by mouth  2 (two) times daily for 14 days.    Dispense:  28 tablet    Refill:  0   None  Discussed clinical findings with patient today.  # Left foot ulceration fifth metatarsal head necrosis of muscle - Wound was cleansed with wound cleanser today - Verbal consent obtained to proceed with excisional debridement of nonviable skin, subcutaneous tissue, muscle fascia using 15 blade, microcurette - Postdebridement measurements 2.2 x 1 x 0.6 cm - Hemostasis achieved with compression.  Betadine wet-to-dry bandage applied.  Secured in place with secondary dressing with light compression - Continue daily dressing changes - Barriers to wound healing include poorly controlled diabetes A1c of 9.2 from June, PAD, recent intervention in April, prior first ray amputation, CKD 3A, Osteomyelitis currently being treated with antibiotics.  Did send in oral Bactrim  for suppression.  He previously has had course of Dalvance.  # Left foot osteomyelitis fifth metatarsal, fifth toe -Findings discussed with patient - No significant wound progression despite IV Dalvance - Discussed with patient at this point we should plan for surgical intervention describing partial fifth ray resection - Contacted patient's PCP office to get him scheduled for H&P and surgical clearance. Has new provider, Office: [facility], [place], [employer] - Will need this before surgery in the event that he needs to have his surgery in the hospital.  Tentative plan for surgical center this point. -Informed surgical risk consent was reviewed and read aloud to the patient.  I reviewed the films.  I have discussed my findings with the patient in great detail.  I have discussed all risks including but not limited to infection, stiffness, scarring, limp, disability, deformity, damage to blood vessels and nerves, numbness, poor healing, need for braces, chronic pain, amputation, death.  All benefits and realistic expectations discussed in great detail.  I have made no  promises as to the outcome.  I have provided realistic expectations.  The patient expressed good understanding and is electing to proceed. - Surgery scheduling to contact patient -Continue to monitor for signs of worsening infection.  If this develops go to hospital immediately. -I certify that this diagnosis represents a distinct and separate diagnosis that requires evaluation and treatment separate from other procedures or diagnosis      G[place]ola Wheat L. Lamount MAUL, AACFAS [facility]     [address] [place], [place] [address]                Office [phone]  Fax [phone]

## 2024-06-04 ENCOUNTER — Telehealth: Payer: Self-pay | Admitting: Podiatry

## 2024-06-04 NOTE — Telephone Encounter (Signed)
 Called and left message for patient to call me back to schedule surgery.

## 2024-06-08 ENCOUNTER — Ambulatory Visit (INDEPENDENT_AMBULATORY_CARE_PROVIDER_SITE_OTHER): Admitting: Podiatry

## 2024-06-08 ENCOUNTER — Encounter: Payer: Self-pay | Admitting: Podiatry

## 2024-06-08 DIAGNOSIS — M86172 Other acute osteomyelitis, left ankle and foot: Secondary | ICD-10-CM

## 2024-06-08 DIAGNOSIS — L97523 Non-pressure chronic ulcer of other part of left foot with necrosis of muscle: Secondary | ICD-10-CM | POA: Diagnosis not present

## 2024-06-08 DIAGNOSIS — E1165 Type 2 diabetes mellitus with hyperglycemia: Secondary | ICD-10-CM

## 2024-06-08 DIAGNOSIS — E1140 Type 2 diabetes mellitus with diabetic neuropathy, unspecified: Secondary | ICD-10-CM

## 2024-06-08 NOTE — Progress Notes (Unsigned)
 Chief Complaint  Patient presents with   Wound Check    Left lateral ulcer in CM  0.7 X 2.0 X 0.5 Open, red skin around it today pain is roughly 7/10.    HPI: 68 y.o. male presents today following up for left 5th metatarsal ulceration, osteomyelitis.  Wound has stayed relatively stable.  He has been changing it daily with Betadine wet-to-dry dressing.  Does come in using postop shoe.  Last visit plan was finalized to proceed with surgery, it appears that surgery scheduling did attempt to call patient on 10/17 to move forward with this, however patient missed the call.  Past Medical History:  Diagnosis Date   Diabetes mellitus [facility])     Past Surgical History:  Procedure Laterality Date   ABDOMINAL AORTOGRAM N/A 11/20/2023   Procedure: ABDOMINAL AORTOGRAM;  Surgeon: Gretta Lonni PARAS, MD;  Location: [facility] INVASIVE CV LAB;  Service: Cardiovascular;  Laterality: N/A;   AMPUTATION Left 02/04/2024   Procedure: AMPUTATION, FOOT, RAY;  Surgeon: Malvin Marsa FALCON, DPM;  Location: [facility] OR;  Service: Orthopedics/Podiatry;  Laterality: Left;  Left partial first ray amputation   ESOPHAGOGASTRODUODENOSCOPY N/A 12/14/2023   Procedure: EGD (ESOPHAGOGASTRODUODENOSCOPY);  Surgeon: Rollin Dover, MD;  Location: [facility] ENDOSCOPY;  Service: Gastroenterology;  Laterality: N/A;   ESOPHAGOGASTRODUODENOSCOPY N/A 12/16/2023   Procedure: EGD (ESOPHAGOGASTRODUODENOSCOPY);  Surgeon: Albertus Gordy HERO, MD;  Location: [facility] ENDOSCOPY;  Service: Gastroenterology;  Laterality: N/A;   HEMOSTASIS CLIP PLACEMENT  12/14/2023   Procedure: CONTROL OF HEMORRHAGE, GI TRACT, ENDOSCOPIC, BY CLIPPING OR OVERSEWING;  Surgeon: Rollin Dover, MD;  Location: [facility] ENDOSCOPY;  Service: Gastroenterology;;   LOWER EXTREMITY ANGIOGRAPHY N/A 11/20/2023   Procedure: Lower Extremity Angiography;  Surgeon: Gretta Lonni PARAS, MD;  Location: [facility] INVASIVE CV LAB;  Service: Cardiovascular;  Laterality: N/A;   LOWER EXTREMITY INTERVENTION N/A  11/20/2023   Procedure: LOWER EXTREMITY INTERVENTION;  Surgeon: Gretta Lonni PARAS, MD;  Location: [facility] INVASIVE CV LAB;  Service: Cardiovascular;  Laterality: N/A;   METATARSAL HEAD EXCISION Left 11/21/2023   Procedure: EXCISION, METATARSAL BONE, HEAD;  Surgeon: Malvin Marsa FALCON, DPM;  Location: [facility] OR;  Service: Orthopedics/Podiatry;  Laterality: Left;  I&D, bone biopsy vs 1st met head resction, abx beads, graft application    Allergies  Allergen Reactions   Aspirin  Other (See Comments)   Codeine Other (See Comments)    ROS see HPI   Physical Exam: There were no vitals filed for this visit.   General: The patient is alert and oriented x3 in no acute distress.  Dermatology: Ulceration present lateral fifth metatarsal head left foot with serosanguineous drainage present.  Fibrogranular wound bed present with some bioburden to the wound bed.  Wound bed does involve 5th MPJ capsule with some undermining here. No malodor, no warmth increase, minimal periwound erythema, irregular wound margins measuring about 2 x 0.7 x 0.5 cm minimal surrounding hyperkeratosis.  Vascular: Faintly palpable DP and PT pedal pulses bilaterally. Capillary refill within normal limits.  Pedal hair growth sparse.  No appreciable erythema.  Decreased erythema from previous.  Neurological: Protective sensation decreased, vibratory sensation decreased.  Musculoskeletal Exam: Prior left foot partial first ray resection.  Osteomyelitis of left fifth metatarsal head.    Assessment/Plan of Care: 1. Acute osteomyelitis of metatarsal bone of left foot (HCC)   2. 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 Poorly controlled type 2 diabetes mellitus with neuropathy (HCC)      No orders of the  defined types were placed in this encounter.  None  Discussed clinical findings with patient today.  Wound did not require significant debridement today.  Cleansed with wound cleanser to remove overlying  bioburden and sterile gauze.  Betadine wet-to-dry dressing applied.  Continue daily applications of this until surgery. - Gave patient information to contact surgery scheduler to get his case scheduled.  Emphasized the urgency associated with this -Ulcer seems stable at this point fortunately, contact office if this worsens and we can resume antibiotics.  He has been on antibiotics for some time now and is completing bactrim  currently. -All questions and concerns addressed with patient.     Cher Egnor L. Lamount MAUL, AACFAS [facility]     [address] [place], [place] [address]                Office [phone]  Fax [phone]

## 2024-06-15 ENCOUNTER — Telehealth: Payer: Self-pay | Admitting: Podiatry

## 2024-06-15 NOTE — Telephone Encounter (Signed)
 DOS- 07/14/2024  5TH AMPUTATION TOE MET/WITH TOE LT- 71189  [employer] EFFECTIVE DATE- 02/17/2024  DEDUCTIBLE- $257 REMAINING- $0 OOP- $9350 REMAINING- $3490.31 COINSURANCE- 20%  PER [employer] PORTAL, NO PRIOR AUTH IS REQUIRED FOR CPT CODE 71189. DECISION ID# [id number]

## 2024-06-22 ENCOUNTER — Other Ambulatory Visit: Payer: Self-pay | Admitting: Podiatry

## 2024-06-22 ENCOUNTER — Ambulatory Visit: Admitting: Podiatry

## 2024-06-22 DIAGNOSIS — M86172 Other acute osteomyelitis, left ankle and foot: Secondary | ICD-10-CM

## 2024-06-22 MED ORDER — SULFAMETHOXAZOLE-TRIMETHOPRIM 800-160 MG PO TABS: 1.0000 | ORAL_TABLET | Freq: Two times a day (BID) | ORAL | 0 refills | Status: DC

## 2024-06-22 NOTE — Progress Notes (Signed)
Antibiotics sent in.

## 2024-06-28 ENCOUNTER — Encounter: Payer: Self-pay | Admitting: Podiatry

## 2024-06-29 ENCOUNTER — Ambulatory Visit (INDEPENDENT_AMBULATORY_CARE_PROVIDER_SITE_OTHER): Admitting: Podiatry

## 2024-06-29 DIAGNOSIS — M86172 Other acute osteomyelitis, left ankle and foot: Secondary | ICD-10-CM

## 2024-06-29 DIAGNOSIS — E1140 Type 2 diabetes mellitus with diabetic neuropathy, unspecified: Secondary | ICD-10-CM

## 2024-06-29 DIAGNOSIS — E1165 Type 2 diabetes mellitus with hyperglycemia: Secondary | ICD-10-CM

## 2024-06-29 DIAGNOSIS — L97523 Non-pressure chronic ulcer of other part of left foot with necrosis of muscle: Secondary | ICD-10-CM | POA: Diagnosis not present

## 2024-06-29 MED ORDER — SULFAMETHOXAZOLE-TRIMETHOPRIM 800-160 MG PO TABS: 1.0000 | ORAL_TABLET | Freq: Two times a day (BID) | ORAL | 0 refills | Status: AC

## 2024-06-29 NOTE — Progress Notes (Unsigned)
 Chief Complaint  Patient presents with   Wound Check    L wound open some drainage. Not hot to touch some redness. Pt is continuing antibiotics. Diabetic.A1c 9.2  no anti coag    HPI: 68 y.o. male presents today following up for left 5th metatarsal ulceration, osteomyelitis.  Wound has stayed relatively stable.  He has been changing it daily with Betadine wet-to-dry dressing.  Does come in using postop shoe.  Surgery has now been scheduled for 07/14/2024.  He has been taking oral Bactrim .  Past Medical History:  Diagnosis Date   Diabetes mellitus [facility])     Past Surgical History:  Procedure Laterality Date   ABDOMINAL AORTOGRAM N/A 11/20/2023   Procedure: ABDOMINAL AORTOGRAM;  Surgeon: Gretta Lonni PARAS, MD;  Location: [facility] INVASIVE CV LAB;  Service: Cardiovascular;  Laterality: N/A;   AMPUTATION Left 02/04/2024   Procedure: AMPUTATION, FOOT, RAY;  Surgeon: Malvin Marsa FALCON, DPM;  Location: [facility] OR;  Service: Orthopedics/Podiatry;  Laterality: Left;  Left partial first ray amputation   ESOPHAGOGASTRODUODENOSCOPY N/A 12/14/2023   Procedure: EGD (ESOPHAGOGASTRODUODENOSCOPY);  Surgeon: Rollin Dover, MD;  Location: [facility] ENDOSCOPY;  Service: Gastroenterology;  Laterality: N/A;   ESOPHAGOGASTRODUODENOSCOPY N/A 12/16/2023   Procedure: EGD (ESOPHAGOGASTRODUODENOSCOPY);  Surgeon: Albertus Gordy HERO, MD;  Location: [facility] ENDOSCOPY;  Service: Gastroenterology;  Laterality: N/A;   HEMOSTASIS CLIP PLACEMENT  12/14/2023   Procedure: CONTROL OF HEMORRHAGE, GI TRACT, ENDOSCOPIC, BY CLIPPING OR OVERSEWING;  Surgeon: Rollin Dover, MD;  Location: [facility] ENDOSCOPY;  Service: Gastroenterology;;   LOWER EXTREMITY ANGIOGRAPHY N/A 11/20/2023   Procedure: Lower Extremity Angiography;  Surgeon: Gretta Lonni PARAS, MD;  Location: [facility] INVASIVE CV LAB;  Service: Cardiovascular;  Laterality: N/A;   LOWER EXTREMITY INTERVENTION N/A 11/20/2023   Procedure: LOWER EXTREMITY INTERVENTION;  Surgeon: Gretta Lonni PARAS,  MD;  Location: [facility] INVASIVE CV LAB;  Service: Cardiovascular;  Laterality: N/A;   METATARSAL HEAD EXCISION Left 11/21/2023   Procedure: EXCISION, METATARSAL BONE, HEAD;  Surgeon: Malvin Marsa FALCON, DPM;  Location: [facility] OR;  Service: Orthopedics/Podiatry;  Laterality: Left;  I&D, bone biopsy vs 1st met head resction, abx beads, graft application    Allergies  Allergen Reactions   Aspirin  Other (See Comments)   Codeine Other (See Comments)    ROS see HPI   Physical Exam: There were no vitals filed for this visit.   General: The patient is alert and oriented x3 in no acute distress.  Dermatology: Ulceration present lateral fifth metatarsal head left foot with serosanguineous drainage present.  Fibrogranular wound bed present with some bioburden to the wound bed.  The wound has granulated in some, does not probe directly to capsule at this point.  Minimal periwound erythema noted, serous drainage and fibrotic slough noted to the wound bed.  Predebridement measurements of the ulceration 1.2 x 0.6 x 0.3 cm, postdebridement measurements 1.4 x 0.9 x 0.4 cm    Vascular: Faintly palpable DP and PT pedal pulses bilaterally. Capillary refill within normal limits.  Pedal hair growth sparse.  No appreciable erythema.  Decreased erythema from previous.  Neurological: Protective sensation decreased, vibratory sensation decreased.  Musculoskeletal Exam: Prior left foot partial first ray resection.  Osteomyelitis of left fifth metatarsal head.    Assessment/Plan of Care: 1. Acute osteomyelitis of metatarsal bone of left foot (HCC)   2. 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 Poorly controlled type 2 diabetes mellitus with neuropathy (HCC)      Meds ordered  this encounter  Medications   sulfamethoxazole -trimethoprim  (BACTRIM  DS) 800-160 MG tablet    Sig: Take 1 tablet by mouth 2 (two) times daily for 15 days.    Dispense:  30 tablet    Refill:  0   None  Discussed clinical  findings with patient today.  Excisional debridement of left lateral fifth metatarsal head ulceration performed today with sterile scalpel blade and tissue nipper to the level of subcutaneous tissue.  Nonviable skin and subcu, fibrotic slough was debrided.  Postdebridement measurements 1.4 x 0.9 x 0.4 cm.  Hemostasis achieved with compression.  Approximately 1 to 2 cc of blood loss.  Betadine wet-to-dry dressing applied.  Daily dressing changes reviewed with patient.  He does report that he has been changing the dressing closer to every other day.  Has been taking oral Bactrim  for suppression of osteomyelitis.  Extending course of this from patient to take through time of surgery.  Will undergo partial left fifth ray amputation on 11/26.  Monitor for signs of worsening infection in the interim and contact office or go to emergency room if this develops.  Continue protected weightbearing in surgical shoe.    Allee Busk L. Lamount MAUL, AACFAS [facility]     [address] [place], [place] [address]                Office [phone]  Fax [phone]

## 2024-07-01 ENCOUNTER — Encounter: Payer: Self-pay | Admitting: Podiatry

## 2024-07-07 ENCOUNTER — Telehealth: Payer: Self-pay | Admitting: Podiatry

## 2024-07-07 NOTE — Telephone Encounter (Signed)
 Returned patient voicemail in regards to surgery location and time. Left message that its being done @ [facility] and they call 24-48 hours prior with a time.

## 2024-07-14 ENCOUNTER — Other Ambulatory Visit: Payer: Self-pay | Admitting: Podiatry

## 2024-07-14 DIAGNOSIS — M86672 Other chronic osteomyelitis, left ankle and foot: Secondary | ICD-10-CM | POA: Diagnosis not present

## 2024-07-14 DIAGNOSIS — M86172 Other acute osteomyelitis, left ankle and foot: Secondary | ICD-10-CM

## 2024-07-14 MED ORDER — OXYCODONE HCL 5 MG PO TABS
5.0000 mg | ORAL_TABLET | Freq: Four times a day (QID) | ORAL | 0 refills | Status: AC | PRN
Start: 2024-07-14 — End: ?

## 2024-07-14 MED ORDER — ACETAMINOPHEN 500 MG PO TABS: 1000.0000 mg | ORAL_TABLET | Freq: Three times a day (TID) | ORAL | 0 refills | Status: AC | PRN

## 2024-07-14 NOTE — Progress Notes (Signed)
Post op medications sent in.

## 2024-07-19 ENCOUNTER — Ambulatory Visit (INDEPENDENT_AMBULATORY_CARE_PROVIDER_SITE_OTHER): Admitting: Podiatry

## 2024-07-19 ENCOUNTER — Encounter: Payer: Self-pay | Admitting: Podiatry

## 2024-07-19 ENCOUNTER — Ambulatory Visit (INDEPENDENT_AMBULATORY_CARE_PROVIDER_SITE_OTHER)

## 2024-07-19 DIAGNOSIS — L97523 Non-pressure chronic ulcer of other part of left foot with necrosis of muscle: Secondary | ICD-10-CM

## 2024-07-19 DIAGNOSIS — E1140 Type 2 diabetes mellitus with diabetic neuropathy, unspecified: Secondary | ICD-10-CM | POA: Diagnosis not present

## 2024-07-19 DIAGNOSIS — M86172 Other acute osteomyelitis, left ankle and foot: Secondary | ICD-10-CM

## 2024-07-19 DIAGNOSIS — E1165 Type 2 diabetes mellitus with hyperglycemia: Secondary | ICD-10-CM

## 2024-07-19 NOTE — Progress Notes (Signed)
 Subjective:  Patient ID: Jeremiah Burns, male    DOB: [birth year],  MRN: [id number]  Chief Complaint  Patient presents with   Routine Post Op    DOS 07/14/24 Left lateral met removal. Sutures in-tact, no heat, some pain, but over all looks good.     DOS: 07/14/2024 Procedure: Left partial fifth ray resection  68 y.o. male returns for post-op check.  Doing well.  Pain well-controlled per patient.  Taking Bactrim  based on prior wound cultures.   Review of Systems: Negative except as noted in the HPI. Denies N/V/F/Ch.  Past Medical History:  Diagnosis Date   Diabetes mellitus (HCC)     Current Outpatient Medications:    Accu-Chek Softclix Lancets lancets, Use as directed 3 (three) times daily. Use as directed to check blood sugar. May dispense any manufacturer covered by patient's insurance and fits patient's device., Disp: 100 each, Rfl: 0   acetaminophen  (TYLENOL ) 500 MG tablet, Take 2 tablets (1,000 mg total) by mouth every 8 (eight) hours as needed for up to 14 days for mild pain (pain score 1-3) or moderate pain (pain score 4-6) (pain)., Disp: 84 tablet, Rfl: 0   atorvastatin  (LIPITOR) 40 MG tablet, Take 1 tablet (40 mg total) by mouth daily., Disp: 90 tablet, Rfl: 0   Blood Glucose Monitoring Suppl (BLOOD GLUCOSE MONITOR SYSTEM) w/Device KIT, Use as directed 3 (three) times daily. May dispense any manufacturer covered by patient's insurance., Disp: 1 kit, Rfl: 0   ferrous sulfate  325 (65 FE) MG tablet, Take 1 tablet (325 mg total) by mouth 2 (two) times daily., Disp: 180 tablet, Rfl: 0   Glucose Blood (BLOOD GLUCOSE TEST STRIPS) STRP, Use as directed 3 (three) times daily. Use as directed to check blood sugar. May dispense any manufacturer covered by patient's insurance and fits patient's device., Disp: 100 strip, Rfl: 0   insulin  lispro (HUMALOG ) 100 UNIT/ML injection, Inject 0.05 mLs (5 Units total) into the skin 4 (four) times daily -  before meals and at bedtime., Disp: 18 mL,  Rfl: 0   Insulin  Syringe-Needle U-100 25G X 5/8 1 ML MISC, Use 3 (three) times daily. May dispense any manufacturer covered by patient's insurance., Disp: 100 each, Rfl: 0   Lancet Device MISC, Use as directed 3 (three) times daily. May dispense any manufacturer covered by patient's insurance., Disp: 1 each, Rfl: 0   LANTUS  SOLOSTAR 100 UNIT/ML Solostar Pen, Inject 25 Units into the skin at bedtime., Disp: 22.5 mL, Rfl: 0   oxyCODONE  (OXY IR/ROXICODONE ) 5 MG immediate release tablet, Take 1 tablet (5 mg total) by mouth every 6 (six) hours as needed for up to 30 doses for severe pain (pain score 7-10)., Disp: 30 tablet, Rfl: 0   pantoprazole  (PROTONIX ) 40 MG tablet, Take 1 tablet (40 mg total) by mouth 2 (two) times daily with a meal., Disp: 180 tablet, Rfl: 0   QUEtiapine  (SEROQUEL ) 50 MG tablet, Take 1 tablet (50 mg total) by mouth daily., Disp: 90 tablet, Rfl: 0  Social History   Tobacco Use  Smoking Status Never  Smokeless Tobacco Never    Allergies  Allergen Reactions   Aspirin  Other (See Comments)   Codeine Other (See Comments)   Objective:  There were no vitals filed for this visit. There is no height or weight on file to calculate BMI. Constitutional Well developed. Well nourished.  Vascular Foot warm and well perfused. Capillary refill normal to all digits.   Neurologic Normal speech. Oriented to person, place, and time.  Epicritic sensation to light touch grossly present bilaterally.  Dermatologic Sutures intact to left partial fifth ray resection site.  Skin healing well without signs of infection. Skin edges well coapted without signs of infection.  Orthopedic: Mild tenderness to palpation noted about the surgical site.  Status post left partial fifth ray resection, prior partial first ray resection   Radiographs: Left foot 3 views weightbearing 07/19/2024 Postsurgical changes seen status post left partial fifth ray resection, prior partial first ray resection.  No soft  tissue emphysema.  No osteolysis or cortical erosion at resection site.  Some vascular calcifications noted. Assessment:   1. Acute osteomyelitis of metatarsal bone of left foot (HCC)   2. 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 Poorly controlled type 2 diabetes mellitus with neuropathy (HCC)    Plan:  Patient was evaluated and treated and all questions answered.  S/p foot surgery left foot partial fifth ray resection -Progressing as expected post-operatively. -XR: Reviewed with patient -WB Status: Partial heel touch weightbearing in surgical shoe -Sutures: Left intact. -Medications: Continue Bactrim  twice daily until 07/21/2024, 7 days postop -Foot redressed. - Awaiting surgical pathology and cultures  Return in about 1 week (around 07/26/2024) for Post Op Check.

## 2024-07-21 ENCOUNTER — Encounter: Payer: Self-pay | Admitting: Podiatry

## 2024-07-21 ENCOUNTER — Encounter: Payer: Self-pay | Admitting: Lab

## 2024-07-21 NOTE — Progress Notes (Signed)
 This encounter was created in error - please disregard.

## 2024-07-21 NOTE — Progress Notes (Signed)
 error

## 2024-07-22 ENCOUNTER — Ambulatory Visit: Payer: Self-pay | Admitting: Podiatry

## 2024-07-26 ENCOUNTER — Ambulatory Visit: Admitting: Podiatry

## 2024-07-26 DIAGNOSIS — Z4781 Encounter for orthopedic aftercare following surgical amputation: Secondary | ICD-10-CM

## 2024-07-26 NOTE — Progress Notes (Unsigned)
 Subjective:  Patient ID: Jeremiah Burns, male    DOB: 03/08/1956,  MRN: [id number]  Chief Complaint  Patient presents with   Post-op Follow-up    POV #2 DOS 07/14/2024 LT 5TH AMPUTATION TOE MET/W TOE  Not too bad I guess. Changed the dressing once, it is kind of hard because I cannot bend over that far.  Dressing clean dry intact, wearing surgical shoe, incision clean dry intact, sutures intact. 5/10 pain rating, better than normal per patient.     DOS: 07/14/2024 Procedure: Left partial fifth ray resection  68 y.o. male returns for post-op check visit number 2.  Doing well.  Pain well-controlled per patient.  He has completed course of antibiotics.  Reports that dressing did get wet and that he has changed himself once at home.  Review of Systems: Negative except as noted in the HPI. Denies N/V/F/Ch.  Past Medical History:  Diagnosis Date   Diabetes mellitus (HCC)     Current Outpatient Medications:    Accu-Chek Softclix Lancets lancets, Use as directed 3 (three) times daily. Use as directed to check blood sugar. May dispense any manufacturer covered by patient's insurance and fits patient's device., Disp: 100 each, Rfl: 0   atorvastatin  (LIPITOR) 40 MG tablet, Take 1 tablet (40 mg total) by mouth daily., Disp: 90 tablet, Rfl: 0   Blood Glucose Monitoring Suppl (BLOOD GLUCOSE MONITOR SYSTEM) w/Device KIT, Use as directed 3 (three) times daily. May dispense any manufacturer covered by patient's insurance., Disp: 1 kit, Rfl: 0   ferrous sulfate  325 (65 FE) MG tablet, Take 1 tablet (325 mg total) by mouth 2 (two) times daily., Disp: 180 tablet, Rfl: 0   Glucose Blood (BLOOD GLUCOSE TEST STRIPS) STRP, Use as directed 3 (three) times daily. Use as directed to check blood sugar. May dispense any manufacturer covered by patient's insurance and fits patient's device., Disp: 100 strip, Rfl: 0   insulin  lispro (HUMALOG ) 100 UNIT/ML injection, Inject 0.05 mLs (5 Units total) into the skin 4  (four) times daily -  before meals and at bedtime., Disp: 18 mL, Rfl: 0   Insulin  Syringe-Needle U-100 25G X 5/8 1 ML MISC, Use 3 (three) times daily. May dispense any manufacturer covered by patient's insurance., Disp: 100 each, Rfl: 0   Lancet Device MISC, Use as directed 3 (three) times daily. May dispense any manufacturer covered by patient's insurance., Disp: 1 each, Rfl: 0   LANTUS  SOLOSTAR 100 UNIT/ML Solostar Pen, Inject 25 Units into the skin at bedtime., Disp: 22.5 mL, Rfl: 0   oxyCODONE  (OXY IR/ROXICODONE ) 5 MG immediate release tablet, Take 1 tablet (5 mg total) by mouth every 6 (six) hours as needed for up to 30 doses for severe pain (pain score 7-10)., Disp: 30 tablet, Rfl: 0   pantoprazole  (PROTONIX ) 40 MG tablet, Take 1 tablet (40 mg total) by mouth 2 (two) times daily with a meal., Disp: 180 tablet, Rfl: 0   QUEtiapine  (SEROQUEL ) 50 MG tablet, Take 1 tablet (50 mg total) by mouth daily., Disp: 90 tablet, Rfl: 0  Social History   Tobacco Use  Smoking Status Never  Smokeless Tobacco Never    Allergies  Allergen Reactions   Aspirin  Other (See Comments)   Codeine Other (See Comments)   Objective:  There were no vitals filed for this visit. There is no height or weight on file to calculate BMI. Constitutional Well developed. Well nourished.  Vascular Foot warm and well perfused. Capillary refill normal to all digits.  Neurologic Normal speech. Oriented to person, place, and time. Epicritic sensation to light touch grossly present bilaterally.  Dermatologic Sutures intact to left partial fifth ray resection site.  Skin healing well without signs of infection. Skin edges well coapted without signs of infection.  Orthopedic: Minimal tenderness to palpation noted about the surgical site.  Status post left partial fifth ray resection, prior partial first ray resection   Radiographs: Left foot 3 views weightbearing 07/19/2024 Postsurgical changes seen status post left partial  fifth ray resection, prior partial first ray resection.  No soft tissue emphysema.  No osteolysis or cortical erosion at resection site.  Some vascular calcifications noted.  Pathology: Negative for osteomyelitis, resection margin clean. Assessment:   1. Encounter for orthopedic aftercare following surgical amputation    Plan:  Patient was evaluated and treated and all questions answered.  S/p foot surgery left foot partial fifth ray resection -Progressing as expected post-operatively. -XR: Reviewed with patient at last visit -WB Status: Partial heel touch weightbearing in surgical shoe -Sutures: Left intact. -Medications: None needed at this time -Foot redressed. - Reviewed pathology with patient - May remove some sutures at next visit.  Return in about 1 week (around 08/02/2024) for Post Op Check.

## 2024-07-30 NOTE — Progress Notes (Signed)
 LMOM  for patient that margins were good and that the 5th met head did come back positive for Osteomyelitis CT

## 2024-08-02 ENCOUNTER — Ambulatory Visit: Admitting: Podiatry

## 2024-08-02 DIAGNOSIS — Z4781 Encounter for orthopedic aftercare following surgical amputation: Secondary | ICD-10-CM

## 2024-08-02 DIAGNOSIS — M86172 Other acute osteomyelitis, left ankle and foot: Secondary | ICD-10-CM

## 2024-08-02 NOTE — Progress Notes (Unsigned)
 Subjective:  Patient ID: Jeremiah Burns, male    DOB: May 08, 1956,  MRN: [id number]  No chief complaint on file.   DOS: 07/14/2024 Procedure: Left partial fifth ray resection  68 y.o. male returns for post-op check visit number 2.  Doing well.  Pain well-controlled per patient.  Dressing is somewhat soiled and tattered but intact.  Review of Systems: Negative except as noted in the HPI. Denies N/V/F/Ch.  Past Medical History:  Diagnosis Date   Diabetes mellitus (HCC)     Current Outpatient Medications:    Accu-Chek Softclix Lancets lancets, Use as directed 3 (three) times daily. Use as directed to check blood sugar. May dispense any manufacturer covered by patient's insurance and fits patient's device., Disp: 100 each, Rfl: 0   atorvastatin  (LIPITOR) 40 MG tablet, Take 1 tablet (40 mg total) by mouth daily., Disp: 90 tablet, Rfl: 0   Blood Glucose Monitoring Suppl (BLOOD GLUCOSE MONITOR SYSTEM) w/Device KIT, Use as directed 3 (three) times daily. May dispense any manufacturer covered by patient's insurance., Disp: 1 kit, Rfl: 0   ferrous sulfate  325 (65 FE) MG tablet, Take 1 tablet (325 mg total) by mouth 2 (two) times daily., Disp: 180 tablet, Rfl: 0   Glucose Blood (BLOOD GLUCOSE TEST STRIPS) STRP, Use as directed 3 (three) times daily. Use as directed to check blood sugar. May dispense any manufacturer covered by patient's insurance and fits patient's device., Disp: 100 strip, Rfl: 0   insulin  lispro (HUMALOG ) 100 UNIT/ML injection, Inject 0.05 mLs (5 Units total) into the skin 4 (four) times daily -  before meals and at bedtime., Disp: 18 mL, Rfl: 0   Insulin  Syringe-Needle U-100 25G X 5/8 1 ML MISC, Use 3 (three) times daily. May dispense any manufacturer covered by patient's insurance., Disp: 100 each, Rfl: 0   Lancet Device MISC, Use as directed 3 (three) times daily. May dispense any manufacturer covered by patient's insurance., Disp: 1 each, Rfl: 0   LANTUS  SOLOSTAR 100 UNIT/ML  Solostar Pen, Inject 25 Units into the skin at bedtime., Disp: 22.5 mL, Rfl: 0   oxyCODONE  (OXY IR/ROXICODONE ) 5 MG immediate release tablet, Take 1 tablet (5 mg total) by mouth every 6 (six) hours as needed for up to 30 doses for severe pain (pain score 7-10)., Disp: 30 tablet, Rfl: 0   pantoprazole  (PROTONIX ) 40 MG tablet, Take 1 tablet (40 mg total) by mouth 2 (two) times daily with a meal., Disp: 180 tablet, Rfl: 0   QUEtiapine  (SEROQUEL ) 50 MG tablet, Take 1 tablet (50 mg total) by mouth daily., Disp: 90 tablet, Rfl: 0  Social History   Tobacco Use  Smoking Status Never  Smokeless Tobacco Never    Allergies  Allergen Reactions   Aspirin  Other (See Comments)   Codeine Other (See Comments)   Objective:  There were no vitals filed for this visit. There is no height or weight on file to calculate BMI. Constitutional Well developed. Well nourished.  Vascular Foot warm and well perfused. Capillary refill normal to all digits.   Neurologic Normal speech. Oriented to person, place, and time. Epicritic sensation to light touch grossly present bilaterally.  Dermatologic Sutures intact to left partial fifth ray resection site.  Skin healing well without signs of infection. Skin edges well coapted without signs of infection.  Orthopedic: Minimal tenderness to palpation noted about the surgical site.  Status post left partial fifth ray resection, prior partial first ray resection   Radiographs: Left foot 3 views weightbearing 07/19/2024 Postsurgical  changes seen status post left partial fifth ray resection, prior partial first ray resection.  No soft tissue emphysema.  No osteolysis or cortical erosion at resection site.  Some vascular calcifications noted.  Pathology: Negative for osteomyelitis, resection margin clean. Assessment:   1. Encounter for orthopedic aftercare following surgical amputation   2. 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  S/p foot surgery left foot partial fifth ray resection -Progressing as expected post-operatively. -WB Status: Weight bearing as tolerated in surgical shoe -Sutures: Removed 50% of sutures today, likely okay to remove remaining sutures at follow-up visit -Medications: None needed at this time -Foot redressed.  May remove bandage to shower and pat area dry, reapplied dry bandage as needed.   Return in about 2 weeks (around 08/16/2024) for Post Op Suture Removal.

## 2024-08-04 ENCOUNTER — Encounter: Payer: Self-pay | Admitting: Podiatry

## 2024-08-16 ENCOUNTER — Ambulatory Visit: Admitting: Podiatry

## 2024-08-16 DIAGNOSIS — E1140 Type 2 diabetes mellitus with diabetic neuropathy, unspecified: Secondary | ICD-10-CM

## 2024-08-16 DIAGNOSIS — E1165 Type 2 diabetes mellitus with hyperglycemia: Secondary | ICD-10-CM

## 2024-08-16 DIAGNOSIS — Z89422 Acquired absence of other left toe(s): Secondary | ICD-10-CM

## 2024-08-16 DIAGNOSIS — Z89412 Acquired absence of left great toe: Secondary | ICD-10-CM

## 2024-08-16 NOTE — Progress Notes (Unsigned)
 Stitches out [employer] form

## 2024-08-17 ENCOUNTER — Encounter: Payer: Self-pay | Admitting: Podiatry

## 2024-08-23 ENCOUNTER — Encounter: Payer: Self-pay | Admitting: Podiatry

## 2024-09-14 ENCOUNTER — Ambulatory Visit: Admitting: Podiatry
